# Patient Record
Sex: Female | Born: 1950
Health system: Southern US, Community
[De-identification: ages and names within clinical notes are randomized; demographics above are authoritative.]

## PROBLEM LIST (undated history)

## (undated) DIAGNOSIS — M1990 Unspecified osteoarthritis, unspecified site: Secondary | ICD-10-CM

## (undated) DIAGNOSIS — M1712 Unilateral primary osteoarthritis, left knee: Secondary | ICD-10-CM

## (undated) DIAGNOSIS — D219 Benign neoplasm of connective and other soft tissue, unspecified: Secondary | ICD-10-CM

## (undated) DIAGNOSIS — G4730 Sleep apnea, unspecified: Secondary | ICD-10-CM

## (undated) DIAGNOSIS — R87619 Unspecified abnormal cytological findings in specimens from cervix uteri: Secondary | ICD-10-CM

## (undated) DIAGNOSIS — A6009 Herpesviral infection of other urogenital tract: Secondary | ICD-10-CM

## (undated) DIAGNOSIS — R17 Unspecified jaundice: Secondary | ICD-10-CM

## (undated) DIAGNOSIS — I10 Essential (primary) hypertension: Secondary | ICD-10-CM

## (undated) DIAGNOSIS — R32 Unspecified urinary incontinence: Secondary | ICD-10-CM

## (undated) DIAGNOSIS — K5790 Diverticulosis of intestine, part unspecified, without perforation or abscess without bleeding: Secondary | ICD-10-CM

## (undated) DIAGNOSIS — Z8614 Personal history of Methicillin resistant Staphylococcus aureus infection: Secondary | ICD-10-CM

## (undated) DIAGNOSIS — M8580 Other specified disorders of bone density and structure, unspecified site: Secondary | ICD-10-CM

## (undated) DIAGNOSIS — E785 Hyperlipidemia, unspecified: Secondary | ICD-10-CM

## (undated) DIAGNOSIS — I359 Nonrheumatic aortic valve disorder, unspecified: Secondary | ICD-10-CM

## (undated) DIAGNOSIS — N816 Rectocele: Secondary | ICD-10-CM

## (undated) HISTORY — DX: Benign neoplasm of connective and other soft tissue, unspecified: D21.9

## (undated) HISTORY — DX: Hyperlipidemia, unspecified: E78.5

## (undated) HISTORY — DX: Unspecified abnormal cytological findings in specimens from cervix uteri: R87.619

## (undated) HISTORY — DX: Diverticulosis of intestine, part unspecified, without perforation or abscess without bleeding: K57.90

## (undated) HISTORY — DX: Rectocele: N81.6

## (undated) HISTORY — PX: DIAGNOSTIC LAPAROSCOPY: SUR761

## (undated) HISTORY — DX: Essential (primary) hypertension: I10

## (undated) HISTORY — PX: COLONOSCOPY: SHX5424

## (undated) HISTORY — DX: Herpesviral infection of other urogenital tract: A60.09

## (undated) HISTORY — PX: CARPAL TUNNEL RELEASE: SHX101

## (undated) HISTORY — PX: PELVIC LAPAROSCOPY: SHX162

## (undated) HISTORY — DX: Nonrheumatic aortic valve disorder, unspecified: I35.9

## (undated) HISTORY — DX: Other specified disorders of bone density and structure, unspecified site: M85.80

## (undated) HISTORY — DX: Unspecified jaundice: R17

## (undated) HISTORY — DX: Unspecified urinary incontinence: R32

---

## 1998-10-18 ENCOUNTER — Encounter: Payer: Self-pay | Admitting: Family Medicine

## 1998-10-18 ENCOUNTER — Ambulatory Visit (HOSPITAL_COMMUNITY): Admission: RE | Admit: 1998-10-18 | Discharge: 1998-10-18 | Payer: Self-pay | Admitting: Family Medicine

## 1999-10-22 HISTORY — PX: OTHER SURGICAL HISTORY: SHX169

## 1999-10-24 ENCOUNTER — Other Ambulatory Visit: Admission: RE | Admit: 1999-10-24 | Discharge: 1999-10-24 | Payer: Self-pay | Admitting: Orthopedic Surgery

## 2000-01-08 ENCOUNTER — Encounter: Payer: Self-pay | Admitting: Family Medicine

## 2000-01-08 ENCOUNTER — Ambulatory Visit (HOSPITAL_COMMUNITY): Admission: RE | Admit: 2000-01-08 | Discharge: 2000-01-08 | Payer: Self-pay | Admitting: Family Medicine

## 2001-01-09 ENCOUNTER — Encounter: Payer: Self-pay | Admitting: Family Medicine

## 2001-01-09 ENCOUNTER — Ambulatory Visit (HOSPITAL_COMMUNITY): Admission: RE | Admit: 2001-01-09 | Discharge: 2001-01-09 | Payer: Self-pay | Admitting: Family Medicine

## 2001-02-24 ENCOUNTER — Ambulatory Visit (HOSPITAL_COMMUNITY): Admission: RE | Admit: 2001-02-24 | Discharge: 2001-02-24 | Payer: Self-pay | Admitting: Gastroenterology

## 2001-03-11 ENCOUNTER — Encounter: Payer: Self-pay | Admitting: Gastroenterology

## 2001-03-11 ENCOUNTER — Ambulatory Visit (HOSPITAL_COMMUNITY): Admission: RE | Admit: 2001-03-11 | Discharge: 2001-03-11 | Payer: Self-pay | Admitting: Gastroenterology

## 2001-04-10 ENCOUNTER — Encounter: Payer: Self-pay | Admitting: Family Medicine

## 2001-04-10 ENCOUNTER — Ambulatory Visit (HOSPITAL_COMMUNITY): Admission: RE | Admit: 2001-04-10 | Discharge: 2001-04-10 | Payer: Self-pay | Admitting: Family Medicine

## 2001-06-01 ENCOUNTER — Ambulatory Visit (HOSPITAL_COMMUNITY): Admission: RE | Admit: 2001-06-01 | Discharge: 2001-06-01 | Payer: Self-pay | Admitting: Family Medicine

## 2001-06-01 ENCOUNTER — Encounter: Payer: Self-pay | Admitting: Family Medicine

## 2002-01-20 ENCOUNTER — Encounter: Payer: Self-pay | Admitting: Family Medicine

## 2002-01-20 ENCOUNTER — Ambulatory Visit (HOSPITAL_COMMUNITY): Admission: RE | Admit: 2002-01-20 | Discharge: 2002-01-20 | Payer: Self-pay | Admitting: Family Medicine

## 2003-03-16 ENCOUNTER — Encounter: Payer: Self-pay | Admitting: Family Medicine

## 2003-03-16 ENCOUNTER — Ambulatory Visit (HOSPITAL_COMMUNITY): Admission: RE | Admit: 2003-03-16 | Discharge: 2003-03-16 | Payer: Self-pay | Admitting: Family Medicine

## 2004-03-16 ENCOUNTER — Ambulatory Visit (HOSPITAL_COMMUNITY): Admission: RE | Admit: 2004-03-16 | Discharge: 2004-03-16 | Payer: Self-pay | Admitting: Family Medicine

## 2004-09-03 ENCOUNTER — Other Ambulatory Visit: Admission: RE | Admit: 2004-09-03 | Discharge: 2004-09-03 | Payer: Self-pay | Admitting: Family Medicine

## 2004-09-20 ENCOUNTER — Ambulatory Visit (HOSPITAL_COMMUNITY): Admission: RE | Admit: 2004-09-20 | Discharge: 2004-09-20 | Payer: Self-pay | Admitting: Family Medicine

## 2004-09-24 ENCOUNTER — Ambulatory Visit (HOSPITAL_COMMUNITY): Admission: RE | Admit: 2004-09-24 | Discharge: 2004-09-24 | Payer: Self-pay | Admitting: Family Medicine

## 2005-03-19 ENCOUNTER — Ambulatory Visit (HOSPITAL_COMMUNITY): Admission: RE | Admit: 2005-03-19 | Discharge: 2005-03-19 | Payer: Self-pay | Admitting: Family Medicine

## 2005-09-04 ENCOUNTER — Other Ambulatory Visit: Admission: RE | Admit: 2005-09-04 | Discharge: 2005-09-04 | Payer: Self-pay | Admitting: Family Medicine

## 2006-04-21 ENCOUNTER — Ambulatory Visit (HOSPITAL_COMMUNITY): Admission: RE | Admit: 2006-04-21 | Discharge: 2006-04-21 | Payer: Self-pay | Admitting: Family Medicine

## 2006-09-23 ENCOUNTER — Other Ambulatory Visit: Admission: RE | Admit: 2006-09-23 | Discharge: 2006-09-23 | Payer: Self-pay | Admitting: Family Medicine

## 2007-03-12 ENCOUNTER — Ambulatory Visit (HOSPITAL_COMMUNITY): Admission: RE | Admit: 2007-03-12 | Discharge: 2007-03-12 | Payer: Self-pay | Admitting: Family Medicine

## 2007-05-07 ENCOUNTER — Ambulatory Visit (HOSPITAL_COMMUNITY): Admission: RE | Admit: 2007-05-07 | Discharge: 2007-05-07 | Payer: Self-pay | Admitting: Family Medicine

## 2007-10-12 ENCOUNTER — Other Ambulatory Visit: Admission: RE | Admit: 2007-10-12 | Discharge: 2007-10-12 | Payer: Self-pay | Admitting: Family Medicine

## 2008-05-13 ENCOUNTER — Ambulatory Visit (HOSPITAL_COMMUNITY): Admission: RE | Admit: 2008-05-13 | Discharge: 2008-05-13 | Payer: Self-pay | Admitting: Family Medicine

## 2008-07-25 ENCOUNTER — Encounter: Admission: RE | Admit: 2008-07-25 | Discharge: 2008-10-19 | Payer: Self-pay | Admitting: Internal Medicine

## 2008-10-17 ENCOUNTER — Ambulatory Visit: Payer: Self-pay | Admitting: Sports Medicine

## 2008-10-17 DIAGNOSIS — M79609 Pain in unspecified limb: Secondary | ICD-10-CM | POA: Insufficient documentation

## 2008-10-17 DIAGNOSIS — M7710 Lateral epicondylitis, unspecified elbow: Secondary | ICD-10-CM | POA: Insufficient documentation

## 2008-10-18 ENCOUNTER — Encounter: Payer: Self-pay | Admitting: Sports Medicine

## 2008-10-21 HISTORY — PX: BILATERAL OOPHORECTOMY: SHX1221

## 2008-11-29 ENCOUNTER — Other Ambulatory Visit: Admission: RE | Admit: 2008-11-29 | Discharge: 2008-11-29 | Payer: Self-pay | Admitting: Family Medicine

## 2008-12-01 ENCOUNTER — Ambulatory Visit (HOSPITAL_COMMUNITY): Admission: RE | Admit: 2008-12-01 | Discharge: 2008-12-01 | Payer: Self-pay | Admitting: Family Medicine

## 2009-05-18 ENCOUNTER — Ambulatory Visit (HOSPITAL_COMMUNITY): Admission: RE | Admit: 2009-05-18 | Discharge: 2009-05-18 | Payer: Self-pay | Admitting: Family Medicine

## 2009-08-25 ENCOUNTER — Ambulatory Visit (HOSPITAL_COMMUNITY): Admission: RE | Admit: 2009-08-25 | Discharge: 2009-08-25 | Payer: Self-pay | Admitting: Family Medicine

## 2009-09-02 ENCOUNTER — Emergency Department (HOSPITAL_COMMUNITY): Admission: EM | Admit: 2009-09-02 | Discharge: 2009-09-02 | Payer: Self-pay | Admitting: Emergency Medicine

## 2009-09-08 ENCOUNTER — Ambulatory Visit (HOSPITAL_COMMUNITY): Admission: RE | Admit: 2009-09-08 | Discharge: 2009-09-08 | Payer: Self-pay | Admitting: Obstetrics and Gynecology

## 2010-05-23 ENCOUNTER — Ambulatory Visit (HOSPITAL_COMMUNITY): Admission: RE | Admit: 2010-05-23 | Discharge: 2010-05-23 | Payer: Self-pay | Admitting: Family Medicine

## 2010-11-11 ENCOUNTER — Encounter: Payer: Self-pay | Admitting: Family Medicine

## 2011-01-23 LAB — URINALYSIS, ROUTINE W REFLEX MICROSCOPIC
Bilirubin Urine: NEGATIVE
Glucose, UA: NEGATIVE mg/dL
Glucose, UA: NEGATIVE mg/dL
Hgb urine dipstick: NEGATIVE
Ketones, ur: NEGATIVE mg/dL
Nitrite: NEGATIVE
Protein, ur: NEGATIVE mg/dL
Protein, ur: NEGATIVE mg/dL
Specific Gravity, Urine: 1.025 (ref 1.005–1.030)
Urobilinogen, UA: 0.2 mg/dL (ref 0.0–1.0)
pH: 5 (ref 5.0–8.0)
pH: 6 (ref 5.0–8.0)

## 2011-01-23 LAB — CBC
HCT: 41.7 % (ref 36.0–46.0)
HCT: 40.1 % (ref 36.0–46.0)
Hemoglobin: 14.2 g/dL (ref 12.0–15.0)
Hemoglobin: 14.1 g/dL (ref 12.0–15.0)
MCHC: 33.9 g/dL (ref 30.0–36.0)
MCHC: 35.1 g/dL (ref 30.0–36.0)
MCV: 94.9 fL (ref 78.0–100.0)
MCV: 94.1 fL (ref 78.0–100.0)
Platelets: 282 10*3/uL (ref 150–400)
Platelets: 237 10*3/uL (ref 150–400)
RBC: 4.4 MIL/uL (ref 3.87–5.11)
RBC: 4.26 MIL/uL (ref 3.87–5.11)
RDW: 12.9 % (ref 11.5–15.5)
RDW: 12 % (ref 11.5–15.5)
WBC: 6 10*3/uL (ref 4.0–10.5)
WBC: 6.1 10*3/uL (ref 4.0–10.5)

## 2011-01-23 LAB — COMPREHENSIVE METABOLIC PANEL
ALT: 83 U/L — ABNORMAL HIGH (ref 0–35)
AST: 51 U/L — ABNORMAL HIGH (ref 0–37)
AST: 26 U/L (ref 0–37)
Albumin: 4.5 g/dL (ref 3.5–5.2)
Albumin: 4 g/dL (ref 3.5–5.2)
Alkaline Phosphatase: 53 U/L (ref 39–117)
Alkaline Phosphatase: 45 U/L (ref 39–117)
BUN: 7 mg/dL (ref 6–23)
CO2: 22 mEq/L (ref 19–32)
Calcium: 9.7 mg/dL (ref 8.4–10.5)
Chloride: 109 mEq/L (ref 96–112)
Chloride: 107 mEq/L (ref 96–112)
Creatinine, Ser: 0.77 mg/dL (ref 0.4–1.2)
GFR calc Af Amer: >60 mL/min (ref >60)
GFR calc Af Amer: >60 mL/min (ref >60)
GFR calc non Af Amer: >60 mL/min (ref >60)
Glucose, Bld: 106 mg/dL — ABNORMAL HIGH (ref 70–99)
Potassium: 4.1 mEq/L (ref 3.5–5.1)
Potassium: 3.8 mEq/L (ref 3.5–5.1)
Sodium: 139 mEq/L (ref 135–145)
Total Bilirubin: 1 mg/dL (ref 0.3–1.2)
Total Bilirubin: 1.2 mg/dL (ref 0.3–1.2)
Total Protein: 7.8 g/dL (ref 6.0–8.3)
Total Protein: 7.4 g/dL (ref 6.0–8.3)

## 2011-01-23 LAB — DIFFERENTIAL
Basophils Absolute: 0 10*3/uL (ref 0.0–0.1)
Basophils Absolute: 0.1 10*3/uL (ref 0.0–0.1)
Basophils Relative: 1 % (ref 0–1)
Basophils Relative: 1 % (ref 0–1)
Eosinophils Absolute: 0.2 10*3/uL (ref 0.0–0.7)
Eosinophils Absolute: 0.2 10*3/uL (ref 0.0–0.7)
Eosinophils Relative: 3 % (ref 0–5)
Lymphocytes Relative: 25 % (ref 12–46)
Lymphs Abs: 1.5 10*3/uL (ref 0.7–4.0)
Lymphs Abs: 1.5 10*3/uL (ref 0.7–4.0)
Monocytes Absolute: 0.5 10*3/uL (ref 0.1–1.0)
Monocytes Relative: 8 % (ref 3–12)
Monocytes Relative: 5 % (ref 3–12)
Neutro Abs: 3.8 10*3/uL (ref 1.7–7.7)
Neutro Abs: 4 10*3/uL (ref 1.7–7.7)
Neutrophils Relative %: 64 % (ref 43–77)

## 2011-01-23 LAB — BUN: BUN: 13 mg/dL (ref 6–23)

## 2011-01-23 LAB — URINE MICROSCOPIC-ADD ON

## 2011-01-23 LAB — CREATININE, SERUM
Creatinine, Ser: 0.72 mg/dL (ref 0.4–1.2)
GFR calc Af Amer: >60 mL/min (ref >60)
GFR calc non Af Amer: >60 mL/min (ref >60)

## 2011-02-12 ENCOUNTER — Encounter: Payer: Self-pay | Admitting: *Deleted

## 2011-03-08 NOTE — Procedures (Signed)
[facility]  Patient:    Rebecca Terry, Rebecca Terry                   MRN: [id number] Proc. Date: 02/24/01 Adm. Date:  [id number] Attending:  Louie Bun CC:         Dario Guardian, M.D.   Procedure Report  PROCEDURE:  Esophagogastroduodenoscopy with biopsy.  INDICATIONS FOR PROCEDURE:  A seven week history of dyspepsia first noticed after eating [place] food. She has had no response to proton pump inhibitors and still complains of abdominal tenderness, sensation of regurgitation and upset stomach without specific abdominal pain. Procedure is to assess for any mucosal abnormalities of the upper GI tract to account for symptoms and to assess for presence of Helicobacter.  DESCRIPTION OF PROCEDURE:  The patient was placed in the left lateral decubitus position then placed on the pulse monitor with continuous low flow oxygen delivered by nasal cannula. She was sedated with 70 mg IV Demerol and 7 mg IV Versed. The Olympus video endoscope was advanced under direct vision into the oropharynx and esophagus. The esophagus was straight and of normal caliber at the squamocolumnar line at 38 cm. There was on visible hiatal hernia, ring, stricture or other abnormality of the gastroesophageal junction. The squamocolumnar line was sharply outlined. The stomach was entered and a small amount of liquid secretions were suctioned from the fundus. Retroflexed view of the cardia was unremarkable. The fundus and body appeared normal. The antrum showed mild erythema and granularity possibly consistent with mild gastritis. A CLOtest was obtained. The duodenum was entered and both the bulb and second portion were well inspected and appeared to be within normal limits. The endoscope was then withdrawn and the patient returned to the recovery room in stable condition. She tolerated the procedure well and there were no immediate complications.  IMPRESSION:  Mild appearance of  antral gastritis otherwise normal endoscopy.  PLAN:  If CLOtest positive will treat for eradication of Helicobacter. If not will try a trial of Reglan and consider upper abdominal ultrasound. DD:  02/24/01 TD:  02/25/01 Job: [id number]

## 2011-06-17 ENCOUNTER — Other Ambulatory Visit (HOSPITAL_COMMUNITY): Payer: Self-pay | Admitting: Family Medicine

## 2011-06-17 DIAGNOSIS — Z1231 Encounter for screening mammogram for malignant neoplasm of breast: Secondary | ICD-10-CM

## 2011-06-26 ENCOUNTER — Other Ambulatory Visit: Payer: Self-pay | Admitting: Obstetrics and Gynecology

## 2011-07-01 ENCOUNTER — Ambulatory Visit (HOSPITAL_COMMUNITY)
Admission: RE | Admit: 2011-07-01 | Discharge: 2011-07-01 | Disposition: A | Discharge status: Home / Self Care | Payer: 59 | Source: Ambulatory Visit | Attending: Interventional Cardiology | Admitting: Interventional Cardiology

## 2011-07-01 DIAGNOSIS — R011 Cardiac murmur, unspecified: Secondary | ICD-10-CM | POA: Insufficient documentation

## 2011-07-01 DIAGNOSIS — I10 Essential (primary) hypertension: Secondary | ICD-10-CM | POA: Insufficient documentation

## 2011-07-01 DIAGNOSIS — I359 Nonrheumatic aortic valve disorder, unspecified: Secondary | ICD-10-CM | POA: Insufficient documentation

## 2011-07-03 ENCOUNTER — Ambulatory Visit (HOSPITAL_COMMUNITY)
Admission: RE | Admit: 2011-07-03 | Discharge: 2011-07-03 | Disposition: A | Discharge status: Home / Self Care | Payer: 59 | Source: Ambulatory Visit | Attending: Family Medicine | Admitting: Family Medicine

## 2011-07-03 DIAGNOSIS — Z1231 Encounter for screening mammogram for malignant neoplasm of breast: Secondary | ICD-10-CM | POA: Insufficient documentation

## 2011-07-22 NOTE — Patient Instructions (Addendum)
   Your procedure is scheduled NU:UVOZDGU Oct 9th  Enter through the Main Entrance of [facility] at:9:45 am Pick up the phone at the desk and dial [phone] and inform us of your arrival  Please call this number if you have any problems the morning of surgery: [phone]  Remember: Do not eat food after midnight  Do not drink clear liquids after:midnight Take these medicines the morning of surgery with a SIP OF WATER:lisinopril  Do not wear jewelry, make-up, or FINGER nail polish Do not wear lotions, powders, or perfumes.  You may wear deodorant. Do not shave 48 hours prior to surgery. Do not bring valuables to the hospital. Leave suitcase in the car. After Surgery it may be brought to your room. For patients being admitted to the hospital, checkout time is 11:00am the day of discharge.  Patients discharged on the day of surgery will not be allowed to drive home.   Name and phone number of your driver:   Remember to use your hibiclens as instructed.Please shower with 1/2 bottle the evening before your surgery and the other 1/2 bottle the morning of surgery.

## 2011-07-24 ENCOUNTER — Inpatient Hospital Stay (HOSPITAL_COMMUNITY): Admission: RE | Admit: 2011-07-24 | Discharge: 2011-07-24 | Payer: 59 | Source: Ambulatory Visit

## 2011-07-30 ENCOUNTER — Ambulatory Visit (HOSPITAL_COMMUNITY): Admission: RE | Admit: 2011-07-30 | Payer: 59 | Source: Ambulatory Visit | Admitting: Obstetrics and Gynecology

## 2011-07-30 ENCOUNTER — Encounter (HOSPITAL_COMMUNITY): Admission: RE | Payer: Self-pay | Source: Ambulatory Visit

## 2011-07-30 SURGERY — URETHROPEXY, USING TRANSVAGINAL TAPE
Anesthesia: Choice

## 2011-11-04 ENCOUNTER — Other Ambulatory Visit (HOSPITAL_COMMUNITY)
Admission: RE | Admit: 2011-11-04 | Discharge: 2011-11-04 | Disposition: A | Discharge status: Home / Self Care | Payer: 59 | Source: Ambulatory Visit | Attending: Family Medicine | Admitting: Family Medicine

## 2011-11-04 ENCOUNTER — Other Ambulatory Visit: Payer: Self-pay | Admitting: Family Medicine

## 2011-11-04 DIAGNOSIS — Z01419 Encounter for gynecological examination (general) (routine) without abnormal findings: Secondary | ICD-10-CM | POA: Insufficient documentation

## 2012-03-23 ENCOUNTER — Other Ambulatory Visit (HOSPITAL_COMMUNITY): Payer: Self-pay | Admitting: Family Medicine

## 2012-03-23 ENCOUNTER — Ambulatory Visit (HOSPITAL_COMMUNITY)
Admission: RE | Admit: 2012-03-23 | Discharge: 2012-03-23 | Disposition: A | Discharge status: Home / Self Care | Payer: 59 | Source: Ambulatory Visit | Attending: Family Medicine | Admitting: Family Medicine

## 2012-03-23 DIAGNOSIS — K5792 Diverticulitis of intestine, part unspecified, without perforation or abscess without bleeding: Secondary | ICD-10-CM

## 2012-03-23 DIAGNOSIS — R109 Unspecified abdominal pain: Secondary | ICD-10-CM | POA: Insufficient documentation

## 2012-03-23 DIAGNOSIS — R509 Fever, unspecified: Secondary | ICD-10-CM | POA: Insufficient documentation

## 2012-03-23 DIAGNOSIS — K5732 Diverticulitis of large intestine without perforation or abscess without bleeding: Secondary | ICD-10-CM | POA: Insufficient documentation

## 2012-03-23 MED ORDER — IOHEXOL 300 MG/ML  SOLN
80.0000 mL | Freq: Once | INTRAMUSCULAR | Status: AC | PRN
  Administered 2012-03-23: 80 mL via INTRAVENOUS

## 2012-07-14 ENCOUNTER — Other Ambulatory Visit: Payer: Self-pay | Admitting: Family Medicine

## 2012-07-14 DIAGNOSIS — Z0000 Encounter for general adult medical examination without abnormal findings: Secondary | ICD-10-CM

## 2012-08-06 ENCOUNTER — Ambulatory Visit (HOSPITAL_COMMUNITY)
Admission: RE | Admit: 2012-08-06 | Discharge: 2012-08-06 | Disposition: A | Discharge status: Home / Self Care | Payer: 59 | Source: Ambulatory Visit | Attending: Family Medicine | Admitting: Family Medicine

## 2012-08-06 DIAGNOSIS — Z0000 Encounter for general adult medical examination without abnormal findings: Secondary | ICD-10-CM

## 2012-08-06 DIAGNOSIS — Z1231 Encounter for screening mammogram for malignant neoplasm of breast: Secondary | ICD-10-CM | POA: Insufficient documentation

## 2013-03-17 ENCOUNTER — Ambulatory Visit (INDEPENDENT_AMBULATORY_CARE_PROVIDER_SITE_OTHER): Payer: 59 | Admitting: Obstetrics and Gynecology

## 2013-03-17 ENCOUNTER — Encounter: Payer: Self-pay | Admitting: Obstetrics and Gynecology

## 2013-03-17 VITALS — BP 130/76 | Ht 64.0 in | Wt 164.0 lb

## 2013-03-17 DIAGNOSIS — M899 Disorder of bone, unspecified: Secondary | ICD-10-CM

## 2013-03-17 DIAGNOSIS — N952 Postmenopausal atrophic vaginitis: Secondary | ICD-10-CM

## 2013-03-17 DIAGNOSIS — Z0000 Encounter for general adult medical examination without abnormal findings: Secondary | ICD-10-CM

## 2013-03-17 DIAGNOSIS — M8580 Other specified disorders of bone density and structure, unspecified site: Secondary | ICD-10-CM

## 2013-03-17 DIAGNOSIS — M949 Disorder of cartilage, unspecified: Secondary | ICD-10-CM

## 2013-03-17 DIAGNOSIS — T148 Other injury of unspecified body region: Secondary | ICD-10-CM

## 2013-03-17 DIAGNOSIS — W57XXXA Bitten or stung by nonvenomous insect and other nonvenomous arthropods, initial encounter: Secondary | ICD-10-CM

## 2013-03-17 DIAGNOSIS — Z01419 Encounter for gynecological examination (general) (routine) without abnormal findings: Secondary | ICD-10-CM

## 2013-03-17 LAB — COMPREHENSIVE METABOLIC PANEL
AST: 14 U/L (ref 0–37)
Albumin: 4.1 g/dL (ref 3.5–5.2)
BUN: 22 mg/dL (ref 6–23)
CO2: 24 mEq/L (ref 19–32)
Calcium: 9.3 mg/dL (ref 8.4–10.5)
Chloride: 107 mEq/L (ref 96–112)
Creat: 0.81 mg/dL (ref 0.50–1.10)
Glucose, Bld: 89 mg/dL (ref 70–99)
Potassium: 4.6 mEq/L (ref 3.5–5.3)

## 2013-03-17 LAB — POCT URINALYSIS DIPSTICK
Bilirubin, UA: NEGATIVE
Glucose, UA: NEGATIVE
Ketones, UA: NEGATIVE
Leukocytes, UA: NEGATIVE
Nitrite, UA: NEGATIVE
pH, UA: 5

## 2013-03-17 LAB — TSH: TSH: 2.046 u[IU]/mL (ref 0.350–4.500)

## 2013-03-17 LAB — HEMOGLOBIN, FINGERSTICK: Hemoglobin, fingerstick: 13.4 g/dL (ref 12.0–16.0)

## 2013-03-17 LAB — LIPID PANEL
Cholesterol: 155 mg/dL (ref 0–200)
Triglycerides: 92 mg/dL (ref <150)

## 2013-03-17 MED ORDER — ESTROGENS, CONJUGATED 0.625 MG/GM VA CREA: TOPICAL_CREAM | Freq: Every day | VAGINAL | Status: DC

## 2013-03-17 MED ORDER — CONJ ESTROG-MEDROXYPROGEST ACE 0.3-1.5 MG PO TABS: 1.0000 | ORAL_TABLET | Freq: Every day | ORAL | Status: DC

## 2013-03-17 NOTE — Progress Notes (Signed)
Patient ID: Rebecca Terry, female   DOB: November 01, 1950, 62 y.o.   MRN: [id number] 62 y.o.   Single    Caucasian   female   [id number]   here for annual exam.   On PremPro.  Cognition is better on HRT. Concerned about vaginal atrophy. In new relationship and sexually active. Doing OK with incontinence (stress) and rectocele.  Wants to continue with observation.  Wants Vitamin D check.  Pulled a tick off this am.  Spends a lot of time outside gardening.   Patient's last menstrual period was 10/21/1992.          Sexually active: yes  The current method of family planning is post menopausal status.    Exercising: No Last mammogram:  October 2013 wnl: [facility]. Last pap smear: 02/2012 wnl.  No HPV testing.   History of abnormal pap: no Smoking: no Alcohol: 5 glasses of wine/beer per week Last colonoscopy: 2012-diverticulosis:  [place]:Dr. Dorena Cookey.  Next colonoscopy due 2022 Last Bone Density:  Years ago: osteopenia Last tetanus shot: 7 years ago Last cholesterol check: 2012 within normal limits  Hgb:  13.4            Urine: Neg   Family History  Problem Relation Age of Onset  . Asthma Mother   . Congestive Heart Failure Father   . Multiple sclerosis Father     Patient Active Problem List   Diagnosis Date Noted  . LATERAL EPICONDYLITIS, RIGHT 10/17/2008  . ARM PAIN, RIGHT 10/17/2008    Past Medical History  Diagnosis Date  . Fibroid     posterior  . Hypertension   . Urinary incontinence     Past Surgical History  Procedure Laterality Date  . Bilateral oophorectomy  2010    Dr. Edward Jolly  . Pelvic laparoscopy      x2--ectopics  . Carpal tunnel release Bilateral   . Arthroscopic knee Left     Allergies: Adhesive; Latex; and Penicillins  Current Outpatient Prescriptions  Medication Sig Dispense Refill  . calcium carbonate (OS-CAL) 600 MG TABS Take 600 mg by mouth 2 (two) times daily with a meal.      . cholecalciferol (VITAMIN D) 1000 UNITS tablet Take  1,000 Units by mouth daily.      Marland Kitchen estrogen, conjugated,-medroxyprogesterone (PREMPRO) 0.3-1.5 MG per tablet Take 1 tablet by mouth daily.        Marland Kitchen lisinopril (PRINIVIL,ZESTRIL) 5 MG tablet Take 5 mg by mouth daily. Takes 1/2 tablet daily      . aspirin EC 81 MG tablet Take 81 mg by mouth daily.        . diclofenac sodium (VOLTAREN) 1 % GEL Use qid       No current facility-administered medications for this visit.    ROS: Pertinent items are noted in HPI.  Social Hx:  Divorced.  In new relationship.  Works as OR Best boy at [employer].  One daughter.  Exam:    BP 130/76  Ht 5\' 4"  (1.626 m)  Wt 164 lb (74.39 kg)  BMI 28.14 kg/m2  LMP 10/21/1992   Wt Readings from Last 3 Encounters:  03/17/13 164 lb (74.39 kg)  10/17/08 159 lb (72.122 kg)     Ht Readings from Last 3 Encounters:  03/17/13 5\' 4"  (1.626 m)    General appearance: alert, cooperative and appears stated age Head: Normocephalic, without obvious abnormality, atraumatic Neck: no adenopathy, supple, symmetrical, trachea midline and thyroid not enlarged, symmetric, no tenderness/mass/nodules Lungs: clear  to auscultation bilaterally Breasts: Inspection negative, No nipple retraction or dimpling, No nipple discharge or bleeding, No axillary or supraclavicular adenopathy, Normal to palpation without dominant masses Heart: regular rate and rhythm Abdomen: soft, non-tender; no masses,  no organomegaly Extremities: extremities normal, atraumatic, no cyanosis or edema Skin: Skin color, texture, turgor normal. No rashes or lesions Lymph nodes: Cervical, supraclavicular, and axillary nodes normal. No abnormal inguinal nodes palpated Neurologic: Grossly normal   Pelvic: External genitalia:  Small tick removed from right suprapubic region using forceps/tweezer.  Left hip area with small red 4 mm area from where other tick was located and previously removed.              Urethra:  normal appearing urethra with no masses,  tenderness or lesions              Bartholins and Skenes: normal                 Vagina: normal appearing vagina with normal color and discharge, no lesions.  First degree rectocele.              Cervix: normal appearance              Pap taken: yes and high risk HPV requested.        Bimanual Exam:  Uterus:  uterus is normal size, shape, consistency and nontender                                      Adnexa: normal adnexa in size, nontender and no masses                                      Rectovaginal: Confirms                                      Anus:  normal sphincter tone, no lesions  Assessment Rectocele Genuine stress incontinence HRT patient Osteopenia History of vitamin D deficiency. Tick bites Atrophic vaginal changes.     P: mammogram in October pap smear and high risk HPV Observation of prolapse and incontinence Continue with PremPro.  No contraindications.  See EPIC orders. Premarin vaginal cream.  See EPIC orders. Lipid profile, CMP, TSH, Vitamin D Bone density at [facility]. Signs and symptoms of complications of tick bites reviewed. return annually or prn     An After Visit Summary was printed and given to the patient.

## 2013-03-17 NOTE — Patient Instructions (Addendum)

## 2013-03-18 ENCOUNTER — Telehealth: Payer: Self-pay

## 2013-03-18 LAB — VITAMIN D 25 HYDROXY (VIT D DEFICIENCY, FRACTURES): Vit D, 25-Hydroxy: 43 ng/mL (ref 30–89)

## 2013-03-18 NOTE — Telephone Encounter (Signed)
Patient notified labs normal.  Pap and bone density pending.

## 2013-03-18 NOTE — Telephone Encounter (Signed)
LMOVM  To call for test results. 

## 2013-03-18 NOTE — Telephone Encounter (Signed)
Message copied by Alphonsa Overall on Thu Mar 18, 2013 12:56 PM ------      Message from: Conley Simmonds      Created: Thu Mar 18, 2013 12:33 PM       Please inform patient of normal results.            Pap and bone density are pending. ------

## 2013-03-18 NOTE — Telephone Encounter (Signed)
Patient returning Amanda's call.

## 2013-03-24 ENCOUNTER — Ambulatory Visit (HOSPITAL_COMMUNITY)
Admission: RE | Admit: 2013-03-24 | Discharge: 2013-03-24 | Disposition: A | Discharge status: Home / Self Care | Payer: 59 | Source: Ambulatory Visit | Attending: Obstetrics and Gynecology | Admitting: Obstetrics and Gynecology

## 2013-03-24 DIAGNOSIS — Z780 Asymptomatic menopausal state: Secondary | ICD-10-CM | POA: Insufficient documentation

## 2013-03-24 DIAGNOSIS — Z13820 Encounter for screening for osteoporosis: Secondary | ICD-10-CM | POA: Insufficient documentation

## 2013-03-24 DIAGNOSIS — M8580 Other specified disorders of bone density and structure, unspecified site: Secondary | ICD-10-CM

## 2013-03-26 ENCOUNTER — Telehealth: Payer: Self-pay | Admitting: *Deleted

## 2013-03-26 ENCOUNTER — Telehealth: Payer: Self-pay | Admitting: Obstetrics and Gynecology

## 2013-03-26 DIAGNOSIS — B373 Candidiasis of vulva and vagina: Secondary | ICD-10-CM

## 2013-03-26 MED ORDER — FLUCONAZOLE 150 MG PO TABS: 150.0000 mg | ORAL_TABLET | Freq: Once | ORAL | Status: DC

## 2013-03-26 NOTE — Telephone Encounter (Signed)
Rebecca Terry, Rebecca Terry out patient. Patient says she was just talking to [place]. She will take the Diflucan

## 2013-03-26 NOTE — Telephone Encounter (Signed)
Message copied by Alisa Graff on Fri Mar 26, 2013  2:35 PM ------      Message from: Douglass Rivers      Created: Fri Mar 19, 2013  9:26 PM       Pt will need colpo, LGSIL with +HRHPV, did not release to mychart ------

## 2013-03-26 NOTE — Telephone Encounter (Signed)
Per Dr Tresa Res verbal order, Diflucan sent to [facility].LM on VM ( VM states "this is Jae Dire") that rx was called as previously discussed.

## 2013-03-26 NOTE — Telephone Encounter (Signed)
Call to patient to notify of Pap results. LMTCB.

## 2013-03-26 NOTE — Telephone Encounter (Signed)
Patient returned call.  Discussed abnormal pap result and recommendation for colpo for further evaluation.  Patient asking about repeating pap. Advised that recommendation for her age would be colpo to further evaluate and rule out anything more progressive since pap is only a screening. 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  Briefly reviewed procedure, instructed to take Mortin 800mg  1 hr prior with food.  Colpo sched for 04-07-13 with Dr Edward Jolly.  Patietn saw on MyCahrt where she has positive yeast also on Pap and she has external irritation so she has started Monistat.  Offered Diflucan but patient has never taken before.  She will continue with Monistat and hydrocortisone ointment.  If not resolved, she will call me back and will check with MD for Diflucan.  Patient agreeable.

## 2013-03-31 ENCOUNTER — Telehealth: Payer: Self-pay | Admitting: *Deleted

## 2013-03-31 NOTE — Telephone Encounter (Signed)
Colpo is scheduled with Dr Edward Jolly on 04-07-13.

## 2013-03-31 NOTE — Telephone Encounter (Signed)
Message copied by Alisa Graff on Wed Mar 31, 2013  9:18 AM ------      Message from: Douglass Rivers      Created: Tue Mar 30, 2013  8:01 AM       Is this colpo in the works? ------

## 2013-04-07 ENCOUNTER — Encounter: Payer: Self-pay | Admitting: Obstetrics and Gynecology

## 2013-04-07 ENCOUNTER — Ambulatory Visit (INDEPENDENT_AMBULATORY_CARE_PROVIDER_SITE_OTHER): Payer: 59 | Admitting: Obstetrics and Gynecology

## 2013-04-07 VITALS — BP 136/82 | HR 64 | Temp 98.5°F | Ht 64.0 in | Wt 162.0 lb

## 2013-04-07 DIAGNOSIS — R87612 Low grade squamous intraepithelial lesion on cytologic smear of cervix (LGSIL): Secondary | ICD-10-CM

## 2013-04-07 DIAGNOSIS — M8580 Other specified disorders of bone density and structure, unspecified site: Secondary | ICD-10-CM

## 2013-04-07 DIAGNOSIS — M899 Disorder of bone, unspecified: Secondary | ICD-10-CM

## 2013-04-08 ENCOUNTER — Encounter: Payer: Self-pay | Admitting: Obstetrics and Gynecology

## 2013-04-08 NOTE — Patient Instructions (Signed)
Colposcopy Colposcopy is a procedure that uses a special lighted microscope (colposcope). It examines your cervix and vagina, or the area around the outside of the vagina, for signs of disease or abnormalities in the cells. You may be sent to a specialist (gynecologist) to do the colposcopy. A biopsy (tissue sample) may be collected during a colposcopy, if the caregiver finds any unusual cells. The biopsy is sent to the lab for further testing, and the results are reported back to your caregiver. A WOMAN MAY NEED THIS PROCEDURE IF:  She has had an abnormal pap smear (taking cells from the cervix for testing).  She has a sore on her cervix, and a Pap test was normal.  The Pap test suggests human papilloma virus (HPV). This virus can cause genital warts and is linked to the development of cervical cancer.  She has genital warts on the cervix, or in or around the outside of the vagina.  Her mother took the drug DES while pregnant.  She has painful intercourse.  She has vaginal bleeding, especially after sexual intercourse.  There is a need to evaluate the results of previous treatment. BEFORE THE PROCEDURE   Colposcopy is done when you are not having a menstrual period.  For 24 hours before the colposcopy, do not:  Douche.  Use tampons.  Use medicines, creams, or suppositories in the vagina.  Have sexual intercourse. PROCEDURE   A colposcopy is done while a woman is lying on her back with her feet in foot rests (stirrups).  A speculum is placed inside the vagina to keep it open and to allow the caregiver to see the cervix. This is the same instrument used to do a pap smear.  The colposcope is placed outside the vagina. It is used to magnify and examine the cervix, vagina, and the area around the outside of the vagina.  A small amount of liquid solution is placed on the area that is to be viewed. This solution is placed on with a cotton applicator. This solution makes it easier to  see the abnormal cells.  Your caregiver will suck out mucus and cells from the canal of the cervix.  Small pieces of tissue for biopsy may be taken at the same time. You may feel mild pain or discomfort when this is done.  Your caregiver will record the location of the abnormal areas and send the tissue samples to a lab for analysis.  If your caregiver biopsies the vagina or outside of the vagina, a local anesthetic (novocaine) is usually given. AFTER THE PROCEDURE   You may have some cramping that often goes away in a few minutes. You may have some soreness for a couple of days.  You may take over-the-counter pain medicine as advised by your caregiver. Do not take aspirin because it can cause bleeding.  Lie down for a few minutes if you feel lightheaded.  You may have some bleeding or dark discharge that should stop in a few days.  You may need to wear a sanitary pad for a few days. HOME CARE INSTRUCTIONS   Avoid sex, douching, and using tampons for a week or as directed.  Only take medicine as directed by your caregiver.  Continue to take birth control pills, if you are on them.  Not all test results are available during your visit. If your test results are not back during the visit, make an appointment with your caregiver to find out the results. Do not assume everything is   normal if you have not heard from your caregiver or the medical facility. It is important for you to follow up on all of your test results.  Follow your caregiver's advice regarding medicines, activity, follow-up visits, and follow-up Pap tests. SEEK MEDICAL CARE IF:   You develop a rash.  You have problems with your medicine. SEEK IMMEDIATE MEDICAL CARE IF:  You are bleeding heavily or are passing blood clots.  You develop a fever over 102 F (38.9 C), with or without chills.  You have abnormal vaginal discharge.  You are having cramps that do not go away after taking your pain medicine.  You  feel lightheaded, dizzy, or faint.  You develop stomach pain. Document Released: 12/28/2002 Document Revised: 12/30/2011 Document Reviewed: 08/10/2009 ExitCare Patient Information 2014 [employer].  

## 2013-04-08 NOTE — Progress Notes (Signed)
Subjective:     Patient ID: Rebecca Terry, female   DOB: 01-22-1951, 62 y.o.   MRN: [id number]  HPI Patient here for colposcopy procedure for pap with LGSIL.    Also had recent bone density performed.  Review of Systems     Objective:   Physical Exam  Genitourinary:         Colposcopy  Consent for procedure.  Acetic acid placed in vagina.  Colposcopy satisfactory.  Small are of acetowhite change at 10 o'clock noted and biopsied.  ECC performed. Colposcopy of the vulva also performed.  General acetowhite change noted of the medial labia bilaterally and on the perineum, the latter of which appears to have obstetric scar noted.     Bone Density showing borderline osteopenia of the hips and spine.  See full report Assessment:     Abnormal pap.  Colposcopy consistent with HPV.  Patient is also menopausal. Osteopenia    Plan:     Follow up biopsy results. Next bone density in 2 years.  No indication for treatment outside of calcium/Vitamin D/weight bearing exercise.

## 2013-04-09 LAB — IPS CERVICAL/ECC/EMB/VULVAR/VAGINAL BIOPSY

## 2013-04-16 ENCOUNTER — Encounter: Payer: Self-pay | Admitting: Obstetrics and Gynecology

## 2013-05-10 ENCOUNTER — Other Ambulatory Visit: Payer: Self-pay | Admitting: Obstetrics and Gynecology

## 2013-05-10 ENCOUNTER — Telehealth: Payer: Self-pay | Admitting: Obstetrics and Gynecology

## 2013-05-10 MED ORDER — FLUCONAZOLE 150 MG PO TABS: 150.0000 mg | ORAL_TABLET | Freq: Once | ORAL | Status: DC

## 2013-05-10 NOTE — Telephone Encounter (Signed)
Patient would like a refill of diflucan. Patient is currently experiencing redness and itching and would like to have the prescription on hand to take if the symptoms do not go away within a couple days. Pt was offered Diflucan Rx on 03/26/2013 and pt declined, per encounter note. Should pt have an office visit? Please advise.

## 2013-05-10 NOTE — Telephone Encounter (Signed)
Patient was talking with Shanda Bumps about a billing question and was forwarded to me to help put in a request for a prescription refill. Patient would like a refill of diflucan. Patient is currently experiencing redness and itching and would like to have the prescription on hand to take if the symptoms do not go away within a couple days. She uses [facility].

## 2013-05-10 NOTE — Telephone Encounter (Signed)
Patient notified that Diflucan refill approved by dr Edward Jolly and need for OV if symptoms get worse or do resolve. Patient agreeable.

## 2013-05-10 NOTE — Telephone Encounter (Signed)
Please inform the patient I will refill the Diflucan.  If symptoms persist, office visit.

## 2013-05-10 NOTE — Telephone Encounter (Signed)
Refill of Diflucan sent.

## 2013-07-05 ENCOUNTER — Other Ambulatory Visit (HOSPITAL_COMMUNITY): Payer: Self-pay | Admitting: Family Medicine

## 2013-07-05 DIAGNOSIS — Z1231 Encounter for screening mammogram for malignant neoplasm of breast: Secondary | ICD-10-CM

## 2013-08-11 ENCOUNTER — Ambulatory Visit (HOSPITAL_COMMUNITY)
Admission: RE | Admit: 2013-08-11 | Discharge: 2013-08-11 | Disposition: A | Discharge status: Home / Self Care | Payer: 59 | Source: Ambulatory Visit | Attending: Family Medicine | Admitting: Family Medicine

## 2013-08-11 DIAGNOSIS — Z1231 Encounter for screening mammogram for malignant neoplasm of breast: Secondary | ICD-10-CM | POA: Insufficient documentation

## 2013-08-26 ENCOUNTER — Other Ambulatory Visit: Payer: Self-pay

## 2013-09-22 ENCOUNTER — Ambulatory Visit: Payer: 59 | Admitting: Obstetrics and Gynecology

## 2013-09-22 ENCOUNTER — Encounter: Payer: Self-pay | Admitting: Obstetrics and Gynecology

## 2013-09-22 ENCOUNTER — Ambulatory Visit (INDEPENDENT_AMBULATORY_CARE_PROVIDER_SITE_OTHER): Payer: 59 | Admitting: Obstetrics and Gynecology

## 2013-09-22 VITALS — BP 150/72 | HR 68 | Resp 16 | Ht 63.5 in | Wt 170.2 lb

## 2013-09-22 DIAGNOSIS — R6889 Other general symptoms and signs: Secondary | ICD-10-CM

## 2013-09-22 DIAGNOSIS — IMO0002 Reserved for concepts with insufficient information to code with codable children: Secondary | ICD-10-CM

## 2013-09-22 NOTE — Progress Notes (Signed)
Patient ID: Rebecca Terry, female   DOB: 1951-06-18, 62 y.o.   MRN: [id number]  Subjective  Patient is here for a 6 month follow up pap. Pap LGSIL. Colposcopic biopsy LGSIL. Dryness and discomfort on the vulva.  Using premarin cream 1/2 gram twice a week. Sexually active with relatively new partner.  Objective  Pelvic - vulva without lesions.  Vagina and cervix no lesions.  Uterus small and nontender.  No adnexal masses or tenderness.  Assessment  LGSIL Atrophic vulvar symptoms.  Plan  Follow up on pap.   Discussion regarding abnormal paps and HPV. Increase premarin cream to 1/2 gram to vulva three times a week.  Has Rx.  Next pap anticipated at annual exam in 6 months.   15 minutes face to face time of which over 50% was spent in counseling.

## 2013-09-22 NOTE — Patient Instructions (Signed)
Keep appointment for annual examination.  This will be hopefully the next time you will need a pap smear.  We will contact you with pap results.

## 2013-09-27 LAB — IPS PAP TEST WITH HPV

## 2013-10-13 ENCOUNTER — Encounter: Payer: Self-pay | Admitting: Sports Medicine

## 2013-10-13 ENCOUNTER — Ambulatory Visit
Admission: RE | Admit: 2013-10-13 | Discharge: 2013-10-13 | Disposition: A | Discharge status: Home / Self Care | Payer: 59 | Source: Ambulatory Visit | Attending: Sports Medicine | Admitting: Sports Medicine

## 2013-10-13 ENCOUNTER — Ambulatory Visit (INDEPENDENT_AMBULATORY_CARE_PROVIDER_SITE_OTHER): Payer: 59 | Admitting: Sports Medicine

## 2013-10-13 VITALS — BP 194/87 | Ht 64.0 in | Wt 160.0 lb

## 2013-10-13 DIAGNOSIS — M542 Cervicalgia: Secondary | ICD-10-CM

## 2013-10-13 DIAGNOSIS — M2040 Other hammer toe(s) (acquired), unspecified foot: Secondary | ICD-10-CM

## 2013-10-13 DIAGNOSIS — M2021 Hallux rigidus, right foot: Secondary | ICD-10-CM | POA: Insufficient documentation

## 2013-10-13 DIAGNOSIS — M25511 Pain in right shoulder: Secondary | ICD-10-CM

## 2013-10-13 DIAGNOSIS — M25519 Pain in unspecified shoulder: Secondary | ICD-10-CM

## 2013-10-13 DIAGNOSIS — M2020 Hallux rigidus, unspecified foot: Secondary | ICD-10-CM

## 2013-10-13 NOTE — Assessment & Plan Note (Signed)
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

## 2013-10-13 NOTE — Assessment & Plan Note (Signed)
Ultimately she needs orthotics and support to unload her medial side

## 2013-10-13 NOTE — Assessment & Plan Note (Signed)
I gave her some basic scapular stabilization exercises  I suspect most of her symptoms comes from the lifting motion but because her symptoms are bilateral we need to check her neck  Rotator cuff is strong and no sign of tear so we will see if scapular stabilization helps  Continue Aleve as needed

## 2013-10-13 NOTE — Progress Notes (Signed)
Patient ID: Rebecca Terry, female   DOB: [birth year], 62 y.o.   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 Palpation is normal with no tenderness over AC joint or bicipital groove. ROM is full in all planes. Rotator cuff strength normal throughout. No signs of impingement with negative Neer and Hawkin's tests, empty can. Speeds mildly + only on RT  and Yergason's tests normal. No labral pathology noted with negative Obrien's, negative clunk and good stability. Normal scapular function observed on LT but scapular clicking on RT and slight protraction No painful arc and no drop arm sign. No apprehension sign  Neck motion is slightly tight Bilat trapezisus spasm No neuro findings

## 2013-10-28 ENCOUNTER — Ambulatory Visit (INDEPENDENT_AMBULATORY_CARE_PROVIDER_SITE_OTHER): Payer: 59 | Admitting: Sports Medicine

## 2013-10-28 ENCOUNTER — Encounter: Payer: Self-pay | Admitting: Sports Medicine

## 2013-10-28 VITALS — BP 153/88 | HR 73 | Ht 63.5 in | Wt 165.0 lb

## 2013-10-28 DIAGNOSIS — M25519 Pain in unspecified shoulder: Secondary | ICD-10-CM

## 2013-10-28 DIAGNOSIS — M47812 Spondylosis without myelopathy or radiculopathy, cervical region: Secondary | ICD-10-CM

## 2013-10-28 DIAGNOSIS — M2040 Other hammer toe(s) (acquired), unspecified foot: Secondary | ICD-10-CM

## 2013-10-28 MED ORDER — AMITRIPTYLINE HCL 25 MG PO TABS: 25.0000 mg | ORAL_TABLET | Freq: Every day | ORAL | Status: DC

## 2013-10-28 NOTE — Assessment & Plan Note (Signed)
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

## 2013-10-28 NOTE — Assessment & Plan Note (Signed)
Hammertoe pad
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
## 2013-10-28 NOTE — Assessment & Plan Note (Signed)
Primarily referred pain  Keep up mild exercises

## 2013-12-28 ENCOUNTER — Ambulatory Visit: Payer: 59 | Admitting: Sports Medicine

## 2014-01-25 ENCOUNTER — Other Ambulatory Visit: Payer: Self-pay | Admitting: Obstetrics and Gynecology

## 2014-01-25 MED ORDER — FLUCONAZOLE 150 MG PO TABS: 150.0000 mg | ORAL_TABLET | Freq: Once | ORAL | Status: DC

## 2014-01-25 NOTE — Progress Notes (Signed)
Patient request for Diflucan.  Has just taken Keflex and now has vaginal symptoms of yeast infection. Unable to come in for office visit due to work schedule.  Will OK Diflucan.  See Epic orders.

## 2014-03-08 ENCOUNTER — Encounter: Payer: Self-pay | Admitting: Obstetrics and Gynecology

## 2014-03-08 ENCOUNTER — Telehealth: Payer: Self-pay | Admitting: Obstetrics and Gynecology

## 2014-03-08 NOTE — Telephone Encounter (Signed)
Calling patient to inform her that her appointment had been moved from 930 to 1100. Sent letter to patient

## 2014-03-21 ENCOUNTER — Ambulatory Visit: Payer: 59 | Admitting: Obstetrics and Gynecology

## 2014-04-06 ENCOUNTER — Encounter: Payer: Self-pay | Admitting: Obstetrics and Gynecology

## 2014-04-06 ENCOUNTER — Ambulatory Visit (INDEPENDENT_AMBULATORY_CARE_PROVIDER_SITE_OTHER): Payer: 59 | Admitting: Obstetrics and Gynecology

## 2014-04-06 VITALS — BP 120/70 | HR 80 | Ht 63.5 in | Wt 164.8 lb

## 2014-04-06 DIAGNOSIS — Z01419 Encounter for gynecological examination (general) (routine) without abnormal findings: Secondary | ICD-10-CM

## 2014-04-06 DIAGNOSIS — Z0000 Encounter for general adult medical examination without abnormal findings: Secondary | ICD-10-CM

## 2014-04-06 LAB — CBC
HCT: 38.9 % (ref 36.0–46.0)
Hemoglobin: 13.4 g/dL (ref 12.0–15.0)
MCH: 32 pg (ref 26.0–34.0)
MCHC: 34.4 g/dL (ref 30.0–36.0)
MCV: 92.8 fL (ref 78.0–100.0)
PLATELETS: 266 10*3/uL (ref 150–400)
RBC: 4.19 MIL/uL (ref 3.87–5.11)
RDW: 13.8 % (ref 11.5–15.5)
WBC: 7.5 10*3/uL (ref 4.0–10.5)

## 2014-04-06 LAB — POCT URINALYSIS DIPSTICK
BILIRUBIN UA: NEGATIVE
GLUCOSE UA: NEGATIVE
LEUKOCYTES UA: NEGATIVE
NITRITE UA: NEGATIVE
PH UA: 5
Protein, UA: NEGATIVE
UROBILINOGEN UA: NEGATIVE

## 2014-04-06 LAB — HEMOGLOBIN, FINGERSTICK: Hemoglobin, fingerstick: 13.8 g/dL (ref 12.0–16.0)

## 2014-04-06 MED ORDER — CONJ ESTROG-MEDROXYPROGEST ACE 0.3-1.5 MG PO TABS: 1.0000 | ORAL_TABLET | Freq: Every day | ORAL | Status: DC

## 2014-04-06 NOTE — Patient Instructions (Signed)

## 2014-04-06 NOTE — Progress Notes (Signed)
Patient ID: Rebecca Terry, female   DOB: 1951-03-12, 63 y.o.   MRN: [id number] GYNECOLOGY VISIT  PCP:  Carol Ada, MD  Referring provider:   HPI: 63 y.o.   Single  Caucasian  female   [phone] with Patient's last menstrual period was 10/21/1992.   here for   AEX.  Lost 9 pounds.  Will start deep water aerobics.   Declines surgery for stress incontinence at this time.   Status post laparoscopic BSO in 2010.   Patient is on PremPro and Premarin cream.   Hgb:   13.8 Urine:  Trace RBC's, Mod. Ketones - asymptomatic.  Patient is a low carb diet.    GYNECOLOGIC HISTORY: Patient's last menstrual period was 10/21/1992. Sexually active:  no Partner preference: female Contraception: postmenopausal   Menopausal hormone therapy: PremPro, Premarin cream DES exposure:   no Sexually transmitted diseases:  HPV  GYN procedures and prior surgeries:   Last mammogram: 08-11-13 wnl:[facility]                Last pap and high risk HPV testing: 09-22-13  Ascus:Pos. HR HPV History of abnormal pap smear:  09-22-13 Ascus:Pos. HR HPV, colpo 04/03/14 - LGSIL, pap 09/22/13 - ASCUS and positive HR HPV.   OB History   Grav Para Term Preterm Abortions TAB SAB Ect Mult Living   4 1 1  2 1  1  1        LIFESTYLE: Exercise: walking and swimming              Tobacco: no Alcohol:    1-2 drinks per week Drug use:  no  OTHER HEALTH MAINTENANCE: Tetanus/TDap:   03/2013 Gardisil:              n/a Influenza:            07/2013 Zostavax:            2013  Bone density:      03-24-13 Osteopenia:[facility] Colonoscopy:     2012 diverticulosis otherwise normal with Dr. Teena Irani.  Next colonoscopy due 2022.  Cholesterol check:  2014 wnl.  Family History  Problem Relation Age of Onset  . Asthma Mother   . Congestive Heart Failure Father   . Multiple sclerosis Father     Patient Active Problem List   Diagnosis Date Noted  . Osteoarthritis of neck 10/28/2013  . Pain in joint,  shoulder region 10/13/2013  . Hallux rigidus of right foot 10/13/2013  . Hammertoe 10/13/2013  . LATERAL EPICONDYLITIS, RIGHT 10/17/2008  . ARM PAIN, RIGHT 10/17/2008   Past Medical History  Diagnosis Date  . Fibroid     posterior  . Hypertension   . Urinary incontinence     Past Surgical History  Procedure Laterality Date  . Bilateral oophorectomy  2010    Dr. Quincy Simmonds  . Pelvic laparoscopy      x2--ectopics  . Carpal tunnel release Bilateral   . Arthroscopic knee Left     ALLERGIES: Adhesive; Latex; and Penicillins  Current Outpatient Prescriptions  Medication Sig Dispense Refill  . amitriptyline (ELAVIL) 25 MG tablet Take 1 tablet (25 mg total) by mouth at bedtime.  30 tablet  1  . conjugated estrogens (PREMARIN) vaginal cream Place vaginally daily. Use 1/2 g vaginally every night at bed time for the first 2 weeks, then use 1/2 g vaginally two times per week as needed to maintain symptom relief.  60 g  3  . estrogen, conjugated,-medroxyprogesterone (  PREMPRO) 0.3-1.5 MG per tablet Take 1 tablet by mouth daily.  90 tablet  3  . fluconazole (DIFLUCAN) 150 MG tablet Take 1 tablet (150 mg total) by mouth once. Take one tablet.  Repeat in 48 hours if symptoms are not completely resolved.  2 tablet  0  . lisinopril (PRINIVIL,ZESTRIL) 5 MG tablet Take 2.5 mg by mouth daily.       . Multiple Vitamins-Minerals (MULTIVITAMIN PO) Take by mouth daily.      . naproxen sodium (ANAPROX) 220 MG tablet Take 220 mg by mouth 2 (two) times daily with a meal.       No current facility-administered medications for this visit.     ROS:  Pertinent items are noted in HPI.  SOCIAL HISTORY:  Divorced.  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[employer]:    BP 120/70  Pulse 80  Ht 5' 3.5" (1.613 m)  Wt 164 lb 12.8 oz (74.753 kg)  BMI 28.73 kg/m2  LMP 10/21/1992   Wt Readings from Last 3 Encounters:  04/06/14 164 lb 12.8 oz (74.753 kg)  10/28/13 165 lb (74.844 kg)  10/13/13 160 lb (72.576  kg)     Ht Readings from Last 3 Encounters:  04/06/14 5' 3.5" (1.613 m)  10/28/13 5' 3.5" (1.613 m)  10/13/13 5\' 4"  (1.626 m)    General appearance: alert, cooperative and appears stated age Head: Normocephalic, without obvious abnormality, atraumatic Neck: no adenopathy, supple, symmetrical, trachea midline and thyroid not enlarged, symmetric, no tenderness/mass/nodules Lungs: clear to auscultation bilaterally Breasts: Inspection negative, No nipple retraction or dimpling, No nipple discharge or bleeding, No axillary or supraclavicular adenopathy, Normal to palpation without dominant masses Heart: regular rate and rhythm Abdomen: soft, non-tender; no masses,  no organomegaly Extremities: extremities normal, atraumatic, no cyanosis or edema Skin: Skin color, texture, turgor normal. No rashes or lesions Lymph nodes: Cervical, supraclavicular, and axillary nodes normal. No abnormal inguinal nodes palpated Neurologic: Grossly normal  Pelvic: External genitalia:  no lesions              Urethra:  normal appearing urethra with no masses, tenderness or lesions              Bartholins and Skenes: normal                 Vagina: normal appearing vagina with normal color and discharge, no lesions              Cervix: normal appearance              Pap and high risk HPV testing done: yes.            Bimanual Exam:  Uterus:  uterus is normal size, shape, consistency and nontender.  Strong Kegel.                                       Adnexa: normal adnexa in size, nontender and no masses                                      Rectovaginal: Confirms                                      Anus:  normal sphincter tone, no lesions  ASSESSMENT  Normal gynecologic exam. 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  Genuine stress incontinence.  HRT patient.  Trace microscopic hematuria.  Asymptomatic.  Ketones in urine.  On a new diet.   PLAN  Mammogram recommended yearly.  Pap smear and high risk  HPV testing performed.  Counseled on self breast exam.  Lipid profile, CMP, CBC.  Will continue with PremPro.0.3/1.5 mg daily.  See Epic orders.  Reviewed risks of thromboembolic events and breast cancer.  Declines refill on Premarin cream.  Currently has enough.  Return annually or prn   An After Visit Summary was printed and given to the patient.

## 2014-04-07 LAB — COMPREHENSIVE METABOLIC PANEL
ALBUMIN: 4.2 g/dL (ref 3.5–5.2)
ALK PHOS: 41 U/L (ref 39–117)
ALT: 21 U/L (ref 0–35)
AST: 17 U/L (ref 0–37)
BUN: 23 mg/dL (ref 6–23)
CALCIUM: 10 mg/dL (ref 8.4–10.5)
CHLORIDE: 104 meq/L (ref 96–112)
CO2: 21 mEq/L (ref 19–32)
Creat: 0.85 mg/dL (ref 0.50–1.10)
Glucose, Bld: 82 mg/dL (ref 70–99)
POTASSIUM: 4.2 meq/L (ref 3.5–5.3)
SODIUM: 138 meq/L (ref 135–145)
TOTAL PROTEIN: 7 g/dL (ref 6.0–8.3)
Total Bilirubin: 0.6 mg/dL (ref 0.2–1.2)

## 2014-04-07 LAB — LIPID PANEL
Cholesterol: 199 mg/dL (ref 0–200)
HDL: 51 mg/dL (ref >39)
LDL CALC: 111 mg/dL — AB (ref 0–99)
Total CHOL/HDL Ratio: 3.9 Ratio
Triglycerides: 183 mg/dL — ABNORMAL HIGH (ref <150)
VLDL: 37 mg/dL (ref 0–40)

## 2014-04-07 LAB — VITAMIN D 25 HYDROXY (VIT D DEFICIENCY, FRACTURES): Vit D, 25-Hydroxy: 51 ng/mL (ref 30–89)

## 2014-04-11 LAB — IPS PAP TEST WITH HPV

## 2014-04-14 ENCOUNTER — Telehealth: Payer: Self-pay | Admitting: Obstetrics and Gynecology

## 2014-04-14 DIAGNOSIS — R87811 Vaginal high risk human papillomavirus (HPV) DNA test positive: Principal | ICD-10-CM

## 2014-04-14 DIAGNOSIS — R87620 Atypical squamous cells of undetermined significance on cytologic smear of vagina (ASC-US): Secondary | ICD-10-CM

## 2014-04-14 NOTE — Telephone Encounter (Signed)
Phone call to discuss pap results. Had to leave message on her cell phone to return my call.  No details given.   Pap showed ASCUS and positive HR HPV.   Needs another colposcopy with me.

## 2014-04-15 NOTE — Telephone Encounter (Signed)
Patient returned phone call to me.  I discussed pap results and recommendation for colposcopy.  Will place an order for this and will schedule after precert completed.

## 2014-04-27 ENCOUNTER — Telehealth: Payer: Self-pay | Admitting: Obstetrics and Gynecology

## 2014-04-27 NOTE — Telephone Encounter (Signed)
Left message for patient to call back. Need to go over benefits and schedule colpo

## 2014-04-27 NOTE — Telephone Encounter (Signed)
Spoke withb patient. Advised that per benefit quote received, she will be responsible to pay $73.76 when she comes in for colpo. Patient agreeable. Scheduled colpo 08.10.2015.  Mailed the In-Office procedure form that includes appointment date and time, patient copay, and cancellation policy.

## 2014-06-01 ENCOUNTER — Encounter: Payer: Self-pay | Admitting: Obstetrics and Gynecology

## 2014-06-01 ENCOUNTER — Ambulatory Visit (INDEPENDENT_AMBULATORY_CARE_PROVIDER_SITE_OTHER): Payer: 59 | Admitting: Obstetrics and Gynecology

## 2014-06-01 VITALS — BP 130/70 | HR 100 | Ht 63.5 in | Wt 166.0 lb

## 2014-06-01 DIAGNOSIS — R87811 Vaginal high risk human papillomavirus (HPV) DNA test positive: Secondary | ICD-10-CM

## 2014-06-01 DIAGNOSIS — R87620 Atypical squamous cells of undetermined significance on cytologic smear of vagina (ASC-US): Secondary | ICD-10-CM

## 2014-06-01 DIAGNOSIS — IMO0002 Reserved for concepts with insufficient information to code with codable children: Secondary | ICD-10-CM | POA: Insufficient documentation

## 2014-06-01 NOTE — Progress Notes (Addendum)
Subjective:     Patient ID: Rebecca Terry, female   DOB: [birth year], 63 y.o.   MRN: [id number]  HPI  Patient here for colposcopy for pap 04/06/14 showing ASCUS and positive HR HPV. Had colposcopy 04/07/13 showing LGSIL. No prior treatment of cervical dysplasia.   Review of Systems  See HPI.    Objective:   Physical Exam  Genitourinary:        Colposcopy of cervix and vulva. Consent for procedure.  Speculum placed in vagina.  3% acetic acid in vagina.  Satisfactory colposcopy.  ECC taken and sent to pathology. Biopsy of exocervix at 3 o'clock and sent to pathology. Minimal EBL. Monsel's placed.  3% acetic acid to vulva. Thin acetowhite change to the bilateral labia minora. Thickened acetowhite change to the perineum. Sterile prep of perineum with Hibiclens. Local 1% lidocaine.  Lot number   [id number]                  , expiration:         03/22/15            3 mm punch biopsy to perineal body. Tissue to pathology.  AgNO3. Good hemostasis. No complications.     Assessment:     ASCUS pap and positive HR HPV. 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 Potential vulvar dysplasia.    Plan:     Follow up biopsies.

## 2014-06-01 NOTE — Patient Instructions (Signed)
Colposcopy Care After Colposcopy is a procedure in which a special tool is used to magnify the surface of the cervix. A tissue sample (biopsy) may also be taken. This sample will be looked at for cervical cancer or other problems. After the test:  You may have some cramping.  Lie down for a few minutes if you feel lightheaded.   You may have some bleeding which should stop in a few days. HOME CARE  Do not have sex or use tampons for 2 to 3 days or as told.  Only take medicine as told by your doctor.  Continue to take your birth control pills as usual. Finding out the results of your test Ask when your test results will be ready. Make sure you get your test results. GET HELP RIGHT AWAY IF:  You are bleeding a lot or are passing blood clots.  You develop a fever of 102 F (38.9 C) or higher.  You have abnormal vaginal discharge.  You have cramps that do not go away with medicine.  You feel lightheaded, dizzy, or pass out (faint). MAKE SURE YOU:   Understand these instructions.  Will watch your condition.  Will get help right away if you are not doing well or get worse. Document Released: 03/25/2008 Document Revised: 12/30/2011 Document Reviewed: 05/06/2013 [facility] Patient Information 2015 [place], [place]. This information is not intended to replace advice given to you by your health care provider. Make sure you discuss any questions you have with your health care provider.

## 2014-06-06 LAB — IPS OTHER TISSUE BIOPSY

## 2014-06-09 ENCOUNTER — Telehealth: Payer: Self-pay | Admitting: Obstetrics and Gynecology

## 2014-06-09 NOTE — Telephone Encounter (Signed)
Phone call to patient regarding colposcopy results.   Patient states her rectocele is somewhat symptomatic but she is not ready for surgery yet.  Bladder is not affecting her daily activity.  Results of the colposcopy reviewed.   Plan for annual exam in one year.  Colposcopy to follow.   Offered to send to physical therapy for pelvic floor.  Declines currently.

## 2014-06-09 NOTE — Telephone Encounter (Signed)
Return call to [place].

## 2014-06-09 NOTE — Telephone Encounter (Signed)
I left a message on patient's cell phone to call.  Colposcopy results showing LGSIL of the cervix and VIN 1 of the perianal region.  No treatment needed.  Next pap and colposcopy in one year.

## 2014-06-15 ENCOUNTER — Encounter: Payer: Self-pay | Admitting: Interventional Cardiology

## 2014-06-15 ENCOUNTER — Encounter: Payer: Self-pay | Admitting: Cardiology

## 2014-06-15 ENCOUNTER — Ambulatory Visit (INDEPENDENT_AMBULATORY_CARE_PROVIDER_SITE_OTHER): Payer: 59 | Admitting: Interventional Cardiology

## 2014-06-15 VITALS — BP 142/78 | HR 59 | Ht 63.5 in | Wt 166.0 lb

## 2014-06-15 DIAGNOSIS — I10 Essential (primary) hypertension: Secondary | ICD-10-CM

## 2014-06-15 DIAGNOSIS — I359 Nonrheumatic aortic valve disorder, unspecified: Secondary | ICD-10-CM

## 2014-06-15 NOTE — Progress Notes (Signed)
Patient ID: Rebecca Terry, female   DOB: 1951/02/18, 63 y.o.   MRN: [id number]    [place], [address] [place], [place]  [address] Phone: [phone] Fax:  [phone]  Date:  06/15/2014   ID:  Rebecca Terry, DOB 1951-03-13, MRN [id number]  PCP:  Rebecca Naas, MD      History of Present Illness: Rebecca Terry is a 63 y.o. female with HTN and aortic insufficiency. SHe has been feeling very well. She had GYN surgery in the past several years without caridac issues. Aortic Insufficiency:  Walks up to 2 miles daily.  Swimming 4-5x/week.  Denies : Chest pain.  Shortness of breath.  Palpitations.  Syncope.   No swelling.  Wt Readings from Last 3 Encounters:  06/15/14 166 lb (75.297 kg)  06/01/14 166 lb (75.297 kg)  04/06/14 164 lb 12.8 oz (74.753 kg)     Past Medical History  Diagnosis Date  . Fibroid     posterior  . Hypertension   . Urinary incontinence   . Aortic valve disorders   . Essential hypertension, benign   . Herpes genitalis in women   . Rectocele   . Gilbert's syndrome   . Diverticulosis     Current Outpatient Prescriptions  Medication Sig Dispense Refill  . conjugated estrogens (PREMARIN) vaginal cream Place 1 Applicatorful vaginally as needed.       Marland Kitchen estrogen, conjugated,-medroxyprogesterone (PREMPRO) 0.3-1.5 MG per tablet Take 1 tablet by mouth daily.  90 tablet  3  . lisinopril (PRINIVIL,ZESTRIL) 5 MG tablet Take 5 mg by mouth daily.       . Multiple Vitamins-Minerals (MULTIVITAMIN PO) Take 1 tablet by mouth daily.        No current facility-administered medications for this visit.    Allergies:    Allergies  Allergen Reactions  . Adhesive [Tape] Rash    blisters  . Latex Rash    blisters  . Penicillins Rash    Social History:  The patient  reports that she quit smoking about 21 years ago. Her smoking use included Cigarettes. She has a 10 pack-year smoking history. She has never used smokeless tobacco. She reports  that she drinks about one ounce of alcohol per week. She reports that she does not use illicit drugs.   Family History:  The patient's family history includes Asthma in her mother; Congestive Heart Failure in her father; Multiple sclerosis in her father.   ROS:  Please see the history of present illness.  No nausea, vomiting.  No fevers, chills.  No focal weakness.  Mild left calf soreness after a cramp.  No dysuria.    All other systems reviewed and negative.   PHYSICAL EXAM: VS:  BP 142/78  Pulse 59  Ht 5' 3.5" (1.613 m)  Wt 166 lb (75.297 kg)  BMI 28.94 kg/m2  LMP 10/21/1992 Well nourished, well developed, in no acute distress HEENT: normal Neck: no JVD, no carotid bruits Cardiac:  normal S1, S2; RRR;  Lungs:  clear to auscultation bilaterally, no wheezing, rhonchi or rales Abd: soft, nontender, no hepatomegaly Ext: no edema Skin: warm and dry Neuro:   no focal abnormalities noted  EKG:  Sinus bradycardia, no ST segment changes   ASSESSMENT AND PLAN:  Aortic insufficiency  No sx of CHF at this time. Echo to be rechecked. LV function appeared normal. No increase in her AI in 2009. COntinue BP control and risk factor modification.    2. Essential hypertension, benign  If systolic BP > 751 chronically at home, would increase lisinopril to 10 mg dialy. BP was better controlled at another appt.   Base f/u on echo result.    Preventive Medicine  Adult topics discussed:  Diet: Lots of fruits and vegetables, 64 ounces of water daily, weight loss.  Exercise: 5 days a week, at least 30 minutes of aerobic exercise.      Signed, Mina Marble, MD, [facility] 06/15/2014 10:00 AM

## 2014-06-15 NOTE — Patient Instructions (Signed)

## 2014-06-22 ENCOUNTER — Ambulatory Visit (HOSPITAL_COMMUNITY): Payer: 59 | Attending: Cardiovascular Disease | Admitting: Radiology

## 2014-06-22 DIAGNOSIS — I359 Nonrheumatic aortic valve disorder, unspecified: Secondary | ICD-10-CM | POA: Insufficient documentation

## 2014-06-22 DIAGNOSIS — I10 Essential (primary) hypertension: Secondary | ICD-10-CM | POA: Insufficient documentation

## 2014-06-22 DIAGNOSIS — Z87891 Personal history of nicotine dependence: Secondary | ICD-10-CM | POA: Diagnosis not present

## 2014-06-22 DIAGNOSIS — I059 Rheumatic mitral valve disease, unspecified: Secondary | ICD-10-CM | POA: Insufficient documentation

## 2014-06-22 NOTE — Progress Notes (Signed)
Echocardiogram performed.  

## 2014-06-23 ENCOUNTER — Telehealth: Payer: Self-pay | Admitting: Interventional Cardiology

## 2014-06-23 ENCOUNTER — Encounter: Payer: Self-pay | Admitting: Interventional Cardiology

## 2014-06-23 NOTE — Telephone Encounter (Signed)
New problem   Pt returning call. Stated you can leave message.

## 2014-06-23 NOTE — Telephone Encounter (Signed)
Called pt back with Echo results.

## 2014-09-06 ENCOUNTER — Telehealth: Payer: Self-pay | Admitting: Obstetrics and Gynecology

## 2014-09-06 NOTE — Telephone Encounter (Signed)
Left message upcoming appointment 03/2015 has been canceled and rescheduled.

## 2014-09-21 ENCOUNTER — Other Ambulatory Visit (HOSPITAL_COMMUNITY): Payer: Self-pay | Admitting: Family Medicine

## 2014-09-21 DIAGNOSIS — Z1231 Encounter for screening mammogram for malignant neoplasm of breast: Secondary | ICD-10-CM

## 2014-10-05 ENCOUNTER — Ambulatory Visit (HOSPITAL_COMMUNITY)
Admission: RE | Admit: 2014-10-05 | Discharge: 2014-10-05 | Disposition: A | Discharge status: Home / Self Care | Payer: 59 | Source: Ambulatory Visit | Attending: Family Medicine | Admitting: Family Medicine

## 2014-10-05 DIAGNOSIS — Z1231 Encounter for screening mammogram for malignant neoplasm of breast: Secondary | ICD-10-CM | POA: Diagnosis present

## 2015-03-09 ENCOUNTER — Ambulatory Visit: Payer: 59 | Admitting: Sports Medicine

## 2015-03-30 ENCOUNTER — Other Ambulatory Visit: Payer: Self-pay | Admitting: Obstetrics and Gynecology

## 2015-03-30 MED ORDER — CONJ ESTROG-MEDROXYPROGEST ACE 0.3-1.5 MG PO TABS: 1.0000 | ORAL_TABLET | Freq: Every day | ORAL | Status: DC

## 2015-03-30 NOTE — Telephone Encounter (Signed)
Medication refill request: Prempro  Last AEX:  04/06/14 Dr. Quincy Simmonds Next AEX: 05/08/15 Dr. Quincy Simmonds Last MMG (if hormonal medication request): 10/05/14 [id number]:Neg  Refill authorized: 04/06/14 #[id number] w/ 3R. Today please advise.

## 2015-03-30 NOTE — Telephone Encounter (Signed)
Refill of Prempro   Pharmacy:  [place], [address]

## 2015-03-30 NOTE — Telephone Encounter (Signed)
I will authorize PremPro 0.3/1.5 mg  Sig - one po q day Disp - 90 RF zero.  Thank you.

## 2015-03-30 NOTE — Telephone Encounter (Signed)
Rx sent to pharmacy   

## 2015-04-12 ENCOUNTER — Ambulatory Visit: Payer: 59 | Admitting: Obstetrics and Gynecology

## 2015-05-08 ENCOUNTER — Ambulatory Visit (INDEPENDENT_AMBULATORY_CARE_PROVIDER_SITE_OTHER): Payer: 59 | Admitting: Obstetrics and Gynecology

## 2015-05-08 ENCOUNTER — Encounter: Payer: Self-pay | Admitting: Obstetrics and Gynecology

## 2015-05-08 VITALS — BP 140/80 | HR 80 | Resp 16 | Ht 63.25 in | Wt 165.4 lb

## 2015-05-08 DIAGNOSIS — Z0000 Encounter for general adult medical examination without abnormal findings: Secondary | ICD-10-CM | POA: Diagnosis not present

## 2015-05-08 DIAGNOSIS — Z01419 Encounter for gynecological examination (general) (routine) without abnormal findings: Secondary | ICD-10-CM

## 2015-05-08 DIAGNOSIS — Z79890 Hormone replacement therapy (postmenopausal): Secondary | ICD-10-CM

## 2015-05-08 LAB — LIPID PANEL
CHOLESTEROL: 177 mg/dL (ref 0–200)
HDL: 61 mg/dL (ref >=46)
LDL CALC: 85 mg/dL (ref 0–99)
Total CHOL/HDL Ratio: 2.9 Ratio
Triglycerides: 154 mg/dL — ABNORMAL HIGH (ref <150)
VLDL: 31 mg/dL (ref 0–40)

## 2015-05-08 LAB — CBC
HEMATOCRIT: 38.3 % (ref 36.0–46.0)
HEMOGLOBIN: 12.7 g/dL (ref 12.0–15.0)
MCH: 31.4 pg (ref 26.0–34.0)
MCHC: 33.2 g/dL (ref 30.0–36.0)
MCV: 94.8 fL (ref 78.0–100.0)
MPV: 9.2 fL (ref 8.6–12.4)
PLATELETS: 250 10*3/uL (ref 150–400)
RBC: 4.04 MIL/uL (ref 3.87–5.11)
RDW: 14.2 % (ref 11.5–15.5)
WBC: 8.6 10*3/uL (ref 4.0–10.5)

## 2015-05-08 LAB — COMPREHENSIVE METABOLIC PANEL
ALT: 16 U/L (ref 0–35)
AST: 16 U/L (ref 0–37)
Albumin: 3.9 g/dL (ref 3.5–5.2)
Alkaline Phosphatase: 39 U/L (ref 39–117)
BILIRUBIN TOTAL: 1 mg/dL (ref 0.2–1.2)
BUN: 15 mg/dL (ref 6–23)
CALCIUM: 9.5 mg/dL (ref 8.4–10.5)
CHLORIDE: 103 meq/L (ref 96–112)
CO2: 26 mEq/L (ref 19–32)
Creat: 0.75 mg/dL (ref 0.50–1.10)
GLUCOSE: 74 mg/dL (ref 70–99)
POTASSIUM: 3.9 meq/L (ref 3.5–5.3)
Sodium: 141 mEq/L (ref 135–145)
Total Protein: 6.9 g/dL (ref 6.0–8.3)

## 2015-05-08 MED ORDER — CONJ ESTROG-MEDROXYPROGEST ACE 0.3-1.5 MG PO TABS: 1.0000 | ORAL_TABLET | Freq: Every day | ORAL | Status: DC

## 2015-05-08 NOTE — Patient Instructions (Signed)

## 2015-05-08 NOTE — Progress Notes (Signed)
Patient ID: Rebecca Terry, female   DOB: [birth year], 64 y.o.   MRN: [id number] 63 y.o. [id number] Single Caucasian female here for annual exam.    Colposcopy in August 2015 showed LGSIL of cervix and of the perineum.  ECC was negative.  Still with some leakage of bladder and has rectocele.   Asking about stopping HRT. Had hot flashes and cognitive change when she did this in the past.   PCP:  Carol Ada, MD  Patient's last menstrual period was 10/21/1992.          Sexually active: No. female The current method of family planning is post menopausal status.    Exercising: Yes.    swimming and walking. 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 MMG:  10-06-14 Density Cat:C/neg:[facility] Colonoscopy:  2012 diverticulosis/nl with Dr. Teena Irani. Next one due 2022. BMD:   03-24-13  Result  [facility] TDaP:  03/2013 Screening Labs:  Hb today: 13.3, Urine today: Neg   reports that she quit smoking about 22 years ago. Her smoking use included Cigarettes. She has a 10 pack-year smoking history. She has never used smokeless tobacco. She reports that she drinks about 3.0 oz of alcohol per week. She reports that she does not use illicit drugs.  Past Medical History  Diagnosis Date  . Fibroid     posterior  . Hypertension   . Urinary incontinence   . Aortic valve disorders   . Essential hypertension, benign   . Herpes genitalis in women   . Rectocele   . Gilbert's syndrome   . Diverticulosis   . 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 Bilateral oophorectomy  2010    Dr. Quincy Simmonds  . Pelvic laparoscopy      x2--ectopics  . Carpal  tunnel release Bilateral   . Arthroscopic knee Left     Current Outpatient Prescriptions  Medication Sig Dispense Refill  . estrogen, conjugated,-medroxyprogesterone (PREMPRO) 0.3-1.5 MG per tablet Take 1 tablet by mouth daily. 90 tablet 0  . lisinopril (PRINIVIL,ZESTRIL) 5 MG tablet Take 5 mg by mouth daily.     . Multiple Vitamins-Minerals (MULTIVITAMIN PO) Take 1 tablet by mouth daily.      No current facility-administered medications for this visit.    Family History  Problem Relation Age of Onset  . Asthma Mother   . Congestive Heart Failure Father   . Multiple sclerosis Father     ROS:  Pertinent items are noted in HPI.  Otherwise, a comprehensive ROS was negative.  Exam:   BP 140/80 mmHg  Pulse 80  Resp 16  Ht 5' 3.25" (1.607 m)  Wt 165 lb 6.4 oz (75.025 kg)  BMI 29.05 kg/m2  LMP 10/21/1992    General appearance: alert, cooperative and appears stated age Head: Normocephalic, without obvious abnormality, atraumatic Neck: no adenopathy, supple, symmetrical, trachea midline and thyroid normal to inspection and palpation Lungs: clear to auscultation bilaterally Breasts: normal appearance, no masses or tenderness, Inspection negative, No nipple retraction or dimpling, No nipple discharge or bleeding, No axillary or supraclavicular adenopathy Heart: regular rate and rhythm Abdomen: soft, non-tender; bowel sounds normal;  no masses,  no organomegaly Extremities: extremities normal, atraumatic, no cyanosis or edema Skin: Skin color, texture, turgor normal. No rashes or lesions Lymph nodes: Cervical, supraclavicular, and axillary nodes normal. No abnormal inguinal nodes palpated Neurologic: Grossly normal  Pelvic: External genitalia:   Whitish change of the epithelium of the perineum.  No irregularity to the skin.              Urethra:  normal appearing urethra with no masses, tenderness or lesions              Bartholins and Skenes: normal                 Vagina: normal  appearing vagina with normal color and discharge, no lesions.  No significant rectocele noted in dorsal lithotomy position.               Cervix: no lesions              Pap taken: Yes.   Bimanual Exam:  Uterus:  normal size, contour, position, consistency, mobility, non-tender              Adnexa: no mass, fullness, tenderness              Rectovaginal: Yes.  .  Confirms.              Anus:  normal sphincter tone, no lesions  Chaperone was present for exam.  Assessment:   Well woman visit with normal exam. LGSIL and VIN I. Osteopenia - very mild in spine.  HRT patient.  Rectocele.  Plan: Yearly mammogram recommended after age 47.  Recommended self breast exam.  Pap and HR HPV as above. Discussed Calcium, Vitamin D, regular exercise program including cardiovascular and weight bearing exercise. Labs performed.  Yes.  .   See orders. Refills given on medications.  Yes.  .  See orders.  PremPro.  Discussed risks of DVT, PE, MI, stroke, breast cancer.  Will wean off in 3 months.  Will do colpo of the vulva.  Will await pap to see if needs colpo of the cervix as well. Dicussed methods to treat constipation.  Discussed possible pessary.  Will consider.  Will wait to do bone density next year.  Follow up annually and prn.      After visit summary provided.

## 2015-05-09 LAB — VITAMIN D 25 HYDROXY (VIT D DEFICIENCY, FRACTURES): Vit D, 25-Hydroxy: 46 ng/mL (ref 30–100)

## 2015-05-09 LAB — TSH: TSH: 1.687 u[IU]/mL (ref 0.350–4.500)

## 2015-05-09 LAB — HEMOGLOBIN, FINGERSTICK: Hemoglobin, fingerstick: 13.3 g/dL (ref 12.0–16.0)

## 2015-05-11 LAB — IPS PAP TEST WITH HPV

## 2015-05-15 ENCOUNTER — Telehealth: Payer: Self-pay | Admitting: Emergency Medicine

## 2015-05-15 ENCOUNTER — Telehealth: Payer: Self-pay | Admitting: Obstetrics and Gynecology

## 2015-05-15 DIAGNOSIS — R87810 Cervical high risk human papillomavirus (HPV) DNA test positive: Secondary | ICD-10-CM

## 2015-05-15 NOTE — Telephone Encounter (Signed)
Thank you for the update and for keeping patient in recall for her colposcopy. Hopefully we can do this in August.

## 2015-05-15 NOTE — Telephone Encounter (Signed)
Patient returned call. Message from Dr. Quincy Simmonds given.  Patient agreeable to procedures but wishes to call back next week to schedule procedures based on work when she has her calendar available. Patient in current recall, will keep in recall at this time until schedules procedures.  Advised she will be contacted with procedure costs for colposcopy of vulva, cervix and vagina. Patient agreeable.   cc Kerry Hough.   Routing update to Dr. Quincy Simmonds.

## 2015-05-15 NOTE — Telephone Encounter (Signed)
Patient wants to speak with Olivia Mackie about scheduling a colpo.

## 2015-05-15 NOTE — Telephone Encounter (Signed)
-----   Message from Nunzio Cobbs, MD sent at 05/11/2015  2:03 PM EDT ----- Please contact patient with pap showing normal cells but positive HR HPV.  Vulva exam at time of annual looked like potential dysplasia. I have already shared with the patient that she needs a colposcopy of the vulva.  Now I would like to colpo the cervix and vagina as well.   Please send to precert to do colpo of both areas and schedule with the patient.   Cc- Marisa Sprinkles

## 2015-05-15 NOTE — Telephone Encounter (Signed)
Message left to return call to Tiffeny Minchew at [phone].    

## 2015-05-15 NOTE — Telephone Encounter (Signed)
Message left to return call to Dov Dill at [phone].    

## 2015-05-17 NOTE — Telephone Encounter (Signed)
Patient returning call.

## 2015-05-17 NOTE — Telephone Encounter (Signed)
Patient returned call and scheduled colposcopy with Dr. Quincy Simmonds for 06/07/15 at 1500.      Instructions given. Motrin 800 mg po one hour before appointment with food. Make sure to eat a meal before appointment and drink plenty of fluids.   Patient agreeable and verbalized understanding of all instructions.   Advised patient will be contacted with insurance coverage.   cc Kerry Hough  Routing to provider for final review. Patient agreeable to disposition. Will close encounter.

## 2015-05-31 ENCOUNTER — Telehealth: Payer: Self-pay | Admitting: Obstetrics and Gynecology

## 2015-05-31 NOTE — Telephone Encounter (Signed)
Please call patient about scheduled appointment.

## 2015-06-02 ENCOUNTER — Telehealth: Payer: Self-pay | Admitting: Interventional Cardiology

## 2015-06-02 NOTE — Telephone Encounter (Signed)
New Message  Pt called requests a call back to discuss if she is do for an ECHO.

## 2015-06-02 NOTE — Telephone Encounter (Signed)
Advised patient that the need for repeat echo will be addressed at office visit next month.  She is agreeable to this plan.

## 2015-06-07 ENCOUNTER — Ambulatory Visit (INDEPENDENT_AMBULATORY_CARE_PROVIDER_SITE_OTHER): Payer: 59 | Admitting: Obstetrics and Gynecology

## 2015-06-07 ENCOUNTER — Encounter: Payer: Self-pay | Admitting: Obstetrics and Gynecology

## 2015-06-07 DIAGNOSIS — R87810 Cervical high risk human papillomavirus (HPV) DNA test positive: Secondary | ICD-10-CM | POA: Diagnosis not present

## 2015-06-07 NOTE — Patient Instructions (Signed)

## 2015-06-07 NOTE — Progress Notes (Signed)
Subjective:     Patient ID: Rebecca Terry, female   DOB: 29-Apr-1951, 64 y.o.   MRN: [id number]  HPI  Patient here today for colposcopy.  Pap smear 05-08-15 normal:Pos HR HPV.  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  Review of Systems  LMP:  10-21-92 Contraception:postmenopausal     Objective:   Physical Exam  Genitourinary:        Colposcopy Consent for procedure. Speculum placed in vagina.  3% acetic acid placed.  Satisfactory colposcopy.  ECC performed and sent to pathology.  Biopsy of thin rim of acetowhite change at 3:00 to pathology.  Monsel's placed.  Minimal EBL.  No complications.     Assessment:     Normal pap and positive HR HPV status.     Plan:     Instructions and precautions given.  Follow up biopsies.  Expect cotesting in one year.   After visit summary to patient.

## 2015-06-08 ENCOUNTER — Encounter: Payer: Self-pay | Admitting: Obstetrics and Gynecology

## 2015-06-13 LAB — IPS OTHER TISSUE BIOPSY

## 2015-06-15 ENCOUNTER — Telehealth: Payer: Self-pay | Admitting: *Deleted

## 2015-06-15 NOTE — Telephone Encounter (Signed)
-----   Message from Nunzio Cobbs, MD sent at 06/13/2015  5:30 PM EDT ----- Results to patient through My Chart.   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  Exocervix bx - LGSIL. ECC - negative.  I recommend cotesting in one year.  Please enter 08 recall.   Cc- Marisa Sprinkles

## 2015-06-15 NOTE — Telephone Encounter (Signed)
Call to patient, left message to call back for triage.

## 2015-06-15 NOTE — Telephone Encounter (Signed)
Return call to patient. Confirms she received My Chart message and denies questions. Annual exam is scheduled for 05-23-16 and stressed importance of follow-up testing in one year at annual. Patient agreeable.  08 Recall entered.  Routing to provider for final review.  Will close encounter.

## 2015-06-15 NOTE — Telephone Encounter (Signed)
Returned call

## 2015-06-26 ENCOUNTER — Emergency Department (HOSPITAL_COMMUNITY)
Admission: EM | Admit: 2015-06-26 | Discharge: 2015-06-26 | Disposition: A | Discharge status: Home / Self Care | Payer: 59 | Attending: Emergency Medicine | Admitting: Emergency Medicine

## 2015-06-26 ENCOUNTER — Encounter (HOSPITAL_COMMUNITY): Payer: Self-pay | Admitting: *Deleted

## 2015-06-26 DIAGNOSIS — Y9289 Other specified places as the place of occurrence of the external cause: Secondary | ICD-10-CM | POA: Insufficient documentation

## 2015-06-26 DIAGNOSIS — Z8719 Personal history of other diseases of the digestive system: Secondary | ICD-10-CM | POA: Diagnosis not present

## 2015-06-26 DIAGNOSIS — Z8639 Personal history of other endocrine, nutritional and metabolic disease: Secondary | ICD-10-CM | POA: Diagnosis not present

## 2015-06-26 DIAGNOSIS — Z792 Long term (current) use of antibiotics: Secondary | ICD-10-CM | POA: Diagnosis not present

## 2015-06-26 DIAGNOSIS — Z8619 Personal history of other infectious and parasitic diseases: Secondary | ICD-10-CM | POA: Insufficient documentation

## 2015-06-26 DIAGNOSIS — Z87448 Personal history of other diseases of urinary system: Secondary | ICD-10-CM | POA: Diagnosis not present

## 2015-06-26 DIAGNOSIS — Z880 Allergy status to penicillin: Secondary | ICD-10-CM | POA: Insufficient documentation

## 2015-06-26 DIAGNOSIS — T7849XA Other allergy, initial encounter: Secondary | ICD-10-CM | POA: Diagnosis not present

## 2015-06-26 DIAGNOSIS — X58XXXA Exposure to other specified factors, initial encounter: Secondary | ICD-10-CM | POA: Diagnosis not present

## 2015-06-26 DIAGNOSIS — Z87891 Personal history of nicotine dependence: Secondary | ICD-10-CM | POA: Diagnosis not present

## 2015-06-26 DIAGNOSIS — Z91040 Latex allergy status: Secondary | ICD-10-CM | POA: Insufficient documentation

## 2015-06-26 DIAGNOSIS — Y9389 Activity, other specified: Secondary | ICD-10-CM | POA: Insufficient documentation

## 2015-06-26 DIAGNOSIS — R220 Localized swelling, mass and lump, head: Secondary | ICD-10-CM | POA: Diagnosis present

## 2015-06-26 DIAGNOSIS — I10 Essential (primary) hypertension: Secondary | ICD-10-CM | POA: Diagnosis not present

## 2015-06-26 DIAGNOSIS — Y998 Other external cause status: Secondary | ICD-10-CM | POA: Diagnosis not present

## 2015-06-26 DIAGNOSIS — T7840XA Allergy, unspecified, initial encounter: Secondary | ICD-10-CM

## 2015-06-26 DIAGNOSIS — Z86018 Personal history of other benign neoplasm: Secondary | ICD-10-CM | POA: Diagnosis not present

## 2015-06-26 DIAGNOSIS — Z79899 Other long term (current) drug therapy: Secondary | ICD-10-CM | POA: Insufficient documentation

## 2015-06-26 MED ORDER — DIPHENHYDRAMINE HCL 50 MG/ML IJ SOLN
25.0000 mg | Freq: Once | INTRAMUSCULAR | Status: AC
  Administered 2015-06-26: 25 mg via INTRAVENOUS
  Filled 2015-06-26: qty 1

## 2015-06-26 MED ORDER — RANITIDINE HCL 150 MG PO TABS: 150.0000 mg | ORAL_TABLET | Freq: Two times a day (BID) | ORAL | Status: DC

## 2015-06-26 MED ORDER — DIPHENHYDRAMINE HCL 25 MG PO TABS: 50.0000 mg | ORAL_TABLET | Freq: Four times a day (QID) | ORAL | Status: DC

## 2015-06-26 MED ORDER — FAMOTIDINE IN NACL 20-0.9 MG/50ML-% IV SOLN
20.0000 mg | Freq: Once | INTRAVENOUS | Status: AC
  Administered 2015-06-26: 20 mg via INTRAVENOUS
  Filled 2015-06-26: qty 50

## 2015-06-26 MED ORDER — PREDNISONE 10 MG PO TABS: 50.0000 mg | ORAL_TABLET | Freq: Every day | ORAL | Status: DC

## 2015-06-26 MED ORDER — METHYLPREDNISOLONE SODIUM SUCC 125 MG IJ SOLR
125.0000 mg | Freq: Once | INTRAMUSCULAR | Status: AC
Start: 2015-06-26 — End: 2015-06-26
  Administered 2015-06-26: 125 mg via INTRAVENOUS
  Filled 2015-06-26: qty 2

## 2015-06-26 NOTE — Discharge Instructions (Signed)

## 2015-06-26 NOTE — ED Provider Notes (Signed)
CSN: [id number]     Arrival date & time 06/26/15  2106 History   First MD Initiated Contact with Patient 06/26/15 2155     Chief Complaint  Patient presents with  . Allergic Reaction  . Angioedema     (Consider location/radiation/quality/duration/timing/severity/associated sxs/prior Treatment) Patient is a 64 y.o. female presenting with allergic reaction. The history is provided by the patient.  Allergic Reaction Presenting symptoms: swelling   Swelling:    Location: right upper lip.   Onset quality:  Sudden   Duration:  1 hour   Timing:  Constant   Progression:  Unchanged   Chronicity:  New Severity:  Mild Prior episodes: latex glove allergy. Relieved by:  Nothing Worsened by:  Nothing tried Ineffective treatments:  Antihistamines   Past Medical History  Diagnosis Date  . Fibroid     posterior  . Hypertension   . Urinary incontinence   . Aortic valve disorders   . Essential hypertension, benign   . Herpes genitalis in women   . Rectocele   . Gilbert's syndrome   . Diverticulosis   . Abnormal Pap smear of cervix     09/2013 Ascus:Pos HR HPV, 03/2014 Ascus:Pos HR HPV;colpo 05/2014 LGSIL of cx and VIN 1of perianal region   Past Surgical History  Procedure Laterality Date  . Bilateral oophorectomy  2010    Dr. Quincy Simmonds  . Pelvic laparoscopy      x2--ectopics  . Carpal tunnel release Bilateral   . Arthroscopic knee Left    Family History  Problem Relation Age of Onset  . Asthma Mother   . Congestive Heart Failure Father   . Multiple sclerosis Father    Social History  Substance Use Topics  . Smoking status: Former Smoker -- 0.50 packs/day for 20 years    Types: Cigarettes    Quit date: 04/07/1993  . Smokeless tobacco: Never Used  . Alcohol Use: 3.0 oz/week    5 Standard drinks or equivalent per week     Comment: 5 glasses wine/beer per week   OB History    Gravida Para Term Preterm AB TAB SAB Ectopic Multiple Living   4 1 1  2 1  1  1      Review of  Systems  All other systems reviewed and are negative.     Allergies  Adhesive; Latex; and Penicillins  Home Medications   Prior to Admission medications   Medication Sig Start Date End Date Taking? Authorizing Provider  ciprofloxacin (CIPRO) 500 MG tablet Take 500 mg by mouth 2 (two) times daily.   Yes Historical Provider, MD  diphenhydrAMINE (BENADRYL) 25 mg capsule Take 25 mg by mouth once.   Yes Historical Provider, MD  estrogen, conjugated,-medroxyprogesterone (PREMPRO) 0.3-1.5 MG per tablet Take 1 tablet by mouth daily. 05/08/15  Yes [place], MD  ibuprofen (ADVIL,MOTRIN) 200 MG tablet Take 800 mg by mouth every 6 (six) hours as needed for moderate pain.   Yes Historical Provider, MD  lisinopril (PRINIVIL,ZESTRIL) 5 MG tablet Take 5 mg by mouth daily.    Yes Historical Provider, MD  metroNIDAZOLE (FLAGYL) 500 MG tablet Take 500 mg by mouth 2 (two) times daily.   Yes Historical Provider, MD  Multiple Vitamins-Minerals (MULTIVITAMIN PO) Take 1 tablet by mouth daily.    Yes Historical Provider, MD  saccharomyces boulardii (FLORASTOR) 250 MG capsule Take 250 mg by mouth 2 (two) times daily.   Yes Historical Provider, MD   BP 182/90 mmHg  Pulse 66  Temp(Src) 98.2 F (36.8 C) (Oral)  Resp 18  SpO2 100%  LMP 10/21/1992 Physical Exam  Constitutional: She is oriented to person, place, and time. She appears well-developed and well-nourished. No distress.  HENT:  Head: Normocephalic.  Right upper lip swelling, mild, with central clearing and mild erythema  Eyes: Conjunctivae are normal.  Neck: Neck supple. No tracheal deviation present.  Cardiovascular: Normal rate and regular rhythm.   Pulmonary/Chest: Effort normal. No respiratory distress.  Abdominal: Soft. She exhibits no distension.  Neurological: She is alert and oriented to person, place, and time.  Skin: Skin is warm and dry.  2 small interdigital areas of erythema on each hand  Psychiatric: She has a normal  mood and affect.    ED Course  Procedures (including critical care time) Labs Review Labs Reviewed - No data to display  Imaging Review No results found. I have personally reviewed and evaluated these images and lab results as part of my medical decision-making.   EKG Interpretation None      MDM   Final diagnoses:  Allergic reaction, initial encounter    64 y.o. female presents with swelling of right side of mouth and red blotchy areas over bilateral hands. Pt has known allergy to certain gloves. Complaining of facial numbness but no CN deficits or other concerning signs fir neurologic cause. Provided H1/H2 blockers and steroids with improvement of swelling, suspect local histaminergic reaction to environmental substance. Pt unsure what this might be. Recommended keeping diary of exposures, steroid burst and scheduled antihistamines. Plan to follow up with PCP as needed and return precautions discussed for worsening or new concerning symptoms.     Leo Grosser, MD 06/27/15 [phone]

## 2015-06-26 NOTE — ED Notes (Signed)
Pt reports R facial swelling and numbness x 45 minutes ago, took one 25mg   Benadryl prior to coming to the ED.  Pt at this time is denying SOB or difficulty breathing.  Pt however, reports tongue numbness.  Pt reports she takes lisinopril x 2 years now.  Pt also reports taking cipro and flagyl for diverticulitis x 7 days now.

## 2015-07-10 ENCOUNTER — Ambulatory Visit: Payer: 59 | Admitting: Interventional Cardiology

## 2015-07-22 DIAGNOSIS — Z8614 Personal history of Methicillin resistant Staphylococcus aureus infection: Secondary | ICD-10-CM

## 2015-07-22 HISTORY — DX: Personal history of Methicillin resistant Staphylococcus aureus infection: Z86.14

## 2015-08-24 ENCOUNTER — Encounter: Payer: Self-pay | Admitting: Interventional Cardiology

## 2015-08-24 ENCOUNTER — Ambulatory Visit (INDEPENDENT_AMBULATORY_CARE_PROVIDER_SITE_OTHER): Payer: 59 | Admitting: Interventional Cardiology

## 2015-08-24 VITALS — BP 136/80 | HR 66 | Ht 64.0 in | Wt 168.0 lb

## 2015-08-24 DIAGNOSIS — I10 Essential (primary) hypertension: Secondary | ICD-10-CM | POA: Diagnosis not present

## 2015-08-24 DIAGNOSIS — E781 Pure hyperglyceridemia: Secondary | ICD-10-CM | POA: Diagnosis not present

## 2015-08-24 DIAGNOSIS — I351 Nonrheumatic aortic (valve) insufficiency: Secondary | ICD-10-CM

## 2015-08-24 DIAGNOSIS — I359 Nonrheumatic aortic valve disorder, unspecified: Secondary | ICD-10-CM

## 2015-08-24 LAB — BASIC METABOLIC PANEL
BUN: 19 mg/dL (ref 7–25)
CHLORIDE: 104 mmol/L (ref 98–110)
CO2: 25 mmol/L (ref 20–31)
CREATININE: 0.73 mg/dL (ref 0.50–0.99)
Calcium: 9.5 mg/dL (ref 8.6–10.4)
GLUCOSE: 91 mg/dL (ref 65–99)
POTASSIUM: 4 mmol/L (ref 3.5–5.3)
Sodium: 137 mmol/L (ref 135–146)

## 2015-08-24 NOTE — Patient Instructions (Signed)
Medication Instructions:  None  Labwork: BMET today  Testing/Procedures: None  Follow-Up: Your physician wants you to follow-up in: 1 year with Dr. Irish Lack. You will receive a reminder letter in the mail two months in advance. If you don't receive a letter, please call our office to schedule the follow-up appointment.   Any Other Special Instructions Will Be Listed Below (If Applicable).     If you need a refill on your cardiac medications before your next appointment, please call your pharmacy.

## 2015-08-24 NOTE — Progress Notes (Signed)
Patient ID: Rebecca Terry, female   DOB: [birth year], 64 y.o.   MRN: [id number]     Cardiology Office Note   Date:  08/24/2015   ID:  Rebecca Terry, DOB [birth year], MRN [id number]  PCP:  Reginia Naas, MD    No chief complaint on file. f/u AI   Wt Readings from Last 3 Encounters:  08/24/15 168 lb (76.204 kg)  06/07/15 165 lb (74.844 kg)  05/08/15 165 lb 6.4 oz (75.025 kg)       History of Present Illness: Rebecca Terry is a 64 y.o. female   with HTN and aortic insufficiency. SHe has been feeling very well. Had one trip to ER.  See below. Aortic Insufficiency:  Walks up to 2 miles daily but less recently. Swimming 4-5x/week during the summer, but none recently.  Denies : Chest pain.  Shortness of breath.  Palpitations.  Syncope.   Had an episode of unilateral facial swelling and went to [facility] ER.  THere was a question of angioedema.  Lisinopril was changed to losartan.  BPs up to 145.  Losartan was increased to 50 mg daily recently.  There was a question of whether this was early Bells palsy.  Sx resolved before she hd to take Prednisone.  She will be trying to wean of the PremPro for hot flashes.    Past Medical History  Diagnosis Date  . Fibroid     posterior  . Hypertension   . Urinary incontinence   . Aortic valve disorders   . Essential hypertension, benign   . Herpes genitalis in women   . Rectocele   . Gilbert's syndrome   . Diverticulosis   . 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 Bilateral oophorectomy  2010    Dr. Quincy Simmonds  . Pelvic laparoscopy      x2--ectopics  . Carpal tunnel release Bilateral   . Arthroscopic knee Left      Current Outpatient Prescriptions  Medication Sig Dispense Refill  . clobetasol cream (TEMOVATE) 2.09 % Apply 1 application topically daily as needed.   1    . estrogen, conjugated,-medroxyprogesterone (PREMPRO) 0.3-1.5 MG per tablet Take 1 tablet by mouth daily. 90 tablet 1  . losartan (COZAAR) 50 MG tablet Take 50 mg by mouth daily.   5  . valACYclovir (VALTREX) 500 MG tablet Take 500 mg by mouth daily as needed.   3   No current facility-administered medications for this visit.    Allergies:   Adhesive; Latex; and Penicillins    Social History:  The patient  reports that she quit smoking about 22 years ago. Her smoking use included Cigarettes. She has a 10 pack-year smoking history. She has never used smokeless tobacco. She reports that she drinks about 3.0 oz of alcohol per week. She reports that she does not use illicit drugs.   Family History:  The patient's family history includes Asthma in her mother; Congestive Heart Failure in her father; Multiple sclerosis in her father.    ROS:  Please see the history of present illness.   Otherwise, review of systems are positive for episode of facial numbness as noted above.   All other systems are reviewed and negative.    PHYSICAL EXAM: VS:  BP 136/80 mmHg  Pulse 66  Ht 5\' 4"  (1.626 m)  Wt 168 lb (76.204 kg)  BMI 28.82 kg/m2  LMP 10/21/1992 , BMI Body mass index is 28.82 kg/(m^2). GEN: Well nourished, well developed, in no acute distress HEENT: normal Neck: no JVD, carotid bruits, or masses Cardiac: RRR; 1/6 diastolic murmur, no rubs, or gallops,no edema  Respiratory:  clear to auscultation bilaterally, normal work of breathing GI: soft, nontender, nondistended, + BS MS: no deformity or atrophy Skin: warm and dry, no rash Neuro:  Strength and sensation are intact Psych: euthymic mood, full affect   EKG:   The ekg ordered today demonstrates NSR, nonspecific ST segment changes, no change from prior   Recent Labs: 05/08/2015: ALT 16; BUN 15; Creat 0.75; Hemoglobin 12.7; Platelets 250; Potassium 3.9; Sodium 141; TSH 1.687   Lipid Panel    Component Value Date/Time   CHOL 177  05/08/2015 1708   TRIG 154* 05/08/2015 1708   HDL 61 05/08/2015 1708   CHOLHDL 2.9 05/08/2015 1708   VLDL 31 05/08/2015 1708   LDLCALC 85 05/08/2015 1708     Other studies Reviewed: Additional studies/ records that were reviewed today with results demonstrating: 2015 echo shows normal EF with mild AI.   ASSESSMENT AND PLAN:  1. Aortic insufficiency: No sx of CHF at this time. Echo from 2015 was reviewed. . LV function appeared normal. No increase in her AI in 2009. COntinue BP control and risk factor modification.  2. HTN: COntinue to follow at home.  COntinue losartan.  I am not convinced that what she had with lisinopril was truly angioedema. She seems to be tolerating her losartan. Will check electrolytes today given the change in medication.  Agree with increase in the losartan 50 mg. Her pressures have been slightly higher. 3. Slightly elevated triglycerides of July 2016. She has cut back on exercise. Hopefully with exercise and better diet control, her lipids will improve.  If they stay elevated, could consider fish oil.     Current medicines are reviewed at length with the patient today.  The patient concerns regarding her medicines were addressed.  The following changes have been made:  No change  Labs/ tests ordered today include:   Orders Placed This Encounter  Procedures  . Basic Metabolic Panel (BMET)  . EKG 12-Lead    Recommend 150 minutes/week of aerobic exercise Low fat, low carb, high fiber diet recommended  Disposition:   FU in 1 year   Teresita Madura., MD  08/24/2015 8:28 AM    [facility] [address], [place], [place]  [address] Phone: [phone]; Fax: [phone]

## 2015-09-22 ENCOUNTER — Other Ambulatory Visit (HOSPITAL_COMMUNITY): Payer: Self-pay | Admitting: Orthopedic Surgery

## 2015-09-22 DIAGNOSIS — M25562 Pain in left knee: Secondary | ICD-10-CM

## 2015-10-04 ENCOUNTER — Ambulatory Visit (HOSPITAL_COMMUNITY): Payer: 59

## 2015-10-05 ENCOUNTER — Ambulatory Visit (HOSPITAL_COMMUNITY)
Admission: RE | Admit: 2015-10-05 | Discharge: 2015-10-05 | Disposition: A | Discharge status: Home / Self Care | Payer: 59 | Source: Ambulatory Visit | Attending: Orthopedic Surgery | Admitting: Orthopedic Surgery

## 2015-10-05 DIAGNOSIS — M660 Rupture of popliteal cyst: Secondary | ICD-10-CM | POA: Diagnosis not present

## 2015-10-05 DIAGNOSIS — M25562 Pain in left knee: Secondary | ICD-10-CM | POA: Diagnosis present

## 2015-10-05 DIAGNOSIS — M25462 Effusion, left knee: Secondary | ICD-10-CM | POA: Diagnosis not present

## 2015-10-05 DIAGNOSIS — S83242A Other tear of medial meniscus, current injury, left knee, initial encounter: Secondary | ICD-10-CM | POA: Diagnosis not present

## 2015-10-05 DIAGNOSIS — X58XXXA Exposure to other specified factors, initial encounter: Secondary | ICD-10-CM | POA: Insufficient documentation

## 2015-10-06 ENCOUNTER — Ambulatory Visit (HOSPITAL_COMMUNITY): Admission: RE | Admit: 2015-10-06 | Payer: 59 | Source: Ambulatory Visit | Admitting: Orthopedic Surgery

## 2015-10-30 ENCOUNTER — Other Ambulatory Visit: Payer: Self-pay

## 2015-10-30 DIAGNOSIS — Z1231 Encounter for screening mammogram for malignant neoplasm of breast: Secondary | ICD-10-CM

## 2015-11-01 ENCOUNTER — Encounter (HOSPITAL_BASED_OUTPATIENT_CLINIC_OR_DEPARTMENT_OTHER): Payer: Self-pay | Admitting: *Deleted

## 2015-11-01 DIAGNOSIS — M1712 Unilateral primary osteoarthritis, left knee: Secondary | ICD-10-CM | POA: Diagnosis not present

## 2015-11-01 DIAGNOSIS — A6009 Herpesviral infection of other urogenital tract: Secondary | ICD-10-CM | POA: Diagnosis present

## 2015-11-01 MED FILL — PREMPRO 0.3 MG-1.5 MG TAB: 0.3-1.5 | 56 days supply | Qty: 56 | Fill #2

## 2015-11-01 MED FILL — MUPIROCIN 2% OINTMENT: 2 | 90 days supply | Qty: 44 | Fill #0

## 2015-11-01 NOTE — H&P (Signed)
TOTAL KNEE ADMISSION H&P  Patient is being admitted for left total knee arthroplasty.  Subjective:  Chief Complaint:left knee pain.  HPI: Rebecca Terry, 65 y.o. female, has a history of pain and functional disability in the left knee due to arthritis and has failed non-surgical conservative treatments for greater than 12 weeks to includeNSAID's and/or analgesics, corticosteriod injections, viscosupplementation injections, flexibility and strengthening excercises, supervised PT with diminished ADL's post treatment, weight reduction as appropriate and activity modification.  Onset of symptoms was gradual, starting >10 years ago with gradually worsening course since that time. The patient noted prior procedures on the knee to include  arthroscopy and menisectomy on the left knee(s).  Patient currently rates pain in the left knee(s) at 10 out of 10 with activity. Patient has night pain, worsening of pain with activity and weight bearing, pain that interferes with activities of daily living, crepitus and joint swelling.  Patient has evidence of subchondral sclerosis, periarticular osteophytes and joint space narrowing by imaging studies.  There is no active infection.  Patient Active Problem List   Diagnosis Date Noted  . Herpes genitalis in women   . Hypertriglyceridemia 08/24/2015  . History of MRSA infection 07/22/2015  . Aortic valve disorder   . Essential hypertension, benign   . 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  . Osteoarthritis of neck 10/28/2013  . Pain in joint, shoulder region 10/13/2013  . Hallux rigidus of right foot 10/13/2013  . Hammertoe 10/13/2013  . LATERAL EPICONDYLITIS, RIGHT 10/17/2008  . ARM PAIN, RIGHT 10/17/2008   Past Medical History  Diagnosis Date  . Fibroid     posterior  . Hypertension   . Urinary incontinence   . Aortic valve disorders   . Essential hypertension, benign   . Herpes genitalis in women   . Rectocele   .  Gilbert's syndrome   . Diverticulosis   . Abnormal Pap smear of cervix     09/2013 Ascus:Pos HR HPV, 03/2014 Ascus:Pos HR HPV;colpo 05/2014 LGSIL of cx and VIN 1of perianal region  . Arthritis   . History of MRSA infection 07/2015    culture positive [facility]  . Primary localized osteoarthritis of left knee     Past Surgical History  Procedure Laterality Date  . Bilateral oophorectomy  2010    Dr. Quincy Simmonds  . Pelvic laparoscopy      x2--ectopics  . Carpal tunnel release Bilateral   . Arthroscopic knee Left 2001  . Diagnostic laparoscopy      No current facility-administered medications for this encounter.  Current outpatient prescriptions:  .  clobetasol cream (TEMOVATE) [id number] %, Apply 1 application topically daily as needed. , Disp: , Rfl: 1 .  estrogen, conjugated,-medroxyprogesterone (PREMPRO) 0.3-1.5 MG per tablet, Take 1 tablet by mouth daily., Disp: 90 tablet, Rfl: 1 .  losartan (COZAAR) 50 MG tablet, Take 50 mg by mouth daily. , Disp: , Rfl: 5 .  valACYclovir (VALTREX) 500 MG tablet, Take 500 mg by mouth daily as needed. , Disp: , Rfl: 3    Allergies  Allergen Reactions  . Adhesive [Tape] Rash    blisters  . Latex Rash  . Penicillins Rash    Social History  Substance Use Topics  . Smoking status: Former Smoker -- 0.50 packs/day for 20 years    Types: Cigarettes    Quit date: 04/07/1993  . Smokeless tobacco: Never Used  . Alcohol Use: 3.0 oz/week    5 Standard drinks or equivalent per week  Comment: 5 glasses wine/beer per week    Family History  Problem Relation Age of Onset  . Asthma Mother   . Congestive Heart Failure Father   . Multiple sclerosis Father      Review of Systems  Constitutional: Negative.   HENT: Negative.   Eyes: Negative.   Respiratory: Negative.   Cardiovascular: Negative.   Gastrointestinal: Negative.   Genitourinary: Negative.   Musculoskeletal: Positive for joint pain.       Left knee pain  Skin: Negative.   Neurological:  Negative.   Psychiatric/Behavioral: Negative.     Objective:  Physical Exam  Constitutional: She is oriented to person, place, and time. She appears well-developed and well-nourished.  HENT:  Head: Normocephalic and atraumatic.  Mouth/Throat: Oropharynx is clear and moist.  Eyes: Conjunctivae are normal. Pupils are equal, round, and reactive to light.  Neck: Normal range of motion. Neck supple.  Cardiovascular: Normal rate, regular rhythm and normal heart sounds.   Respiratory: Effort normal and breath sounds normal.  GI: Soft. Bowel sounds are normal.  Genitourinary:  Not pertinent to current symptomatology therefore not examined.  Musculoskeletal:  Examination of her left knee reveals pain medially, 1+ synovitis.  Range of motion is 0 to 120 degrees.  The knee is stable with normal  patellar tracking.  Examination of the right knee reveals full range of motion without pain, swelling, weakness or instability.  Vascular examination: pulses are 2+ and symmetric  Neurological: She is alert and oriented to person, place, and time.  Skin: Skin is warm and dry.  Psychiatric: She has a normal mood and affect. Her behavior is normal.    Vital signs in last 24 hours: Weight:  [74.844 kg (165 lb)] 74.844 kg (165 lb) (01/11 1013)  Labs:   Estimated body mass index is 28.31 kg/(m^2) as calculated from the following:   Height as of this encounter: 5\' 4"  (1.626 m).   Weight as of this encounter: 74.844 kg (165 lb).   Imaging Review Plain radiographs demonstrate severe degenerative joint disease of the left knee(s). The overall alignment issignificant varus. The bone quality appears to be good for age and reported activity level.  Assessment/Plan:  End stage arthritis, left knee Active Problems:   Aortic valve disorder   Essential hypertension, benign   History of MRSA infection   Herpes genitalis in women    The patient history, physical examination, clinical judgment of the  provider and imaging studies are consistent with end stage degenerative joint disease of the left knee(s) and total knee arthroplasty is deemed medically necessary. The treatment options including medical management, injection therapy arthroscopy and arthroplasty were discussed at length. The risks and benefits of total knee arthroplasty were presented and reviewed. The risks due to aseptic loosening, infection, stiffness, patella tracking problems, thromboembolic complications and other imponderables were discussed. The patient acknowledged the explanation, agreed to proceed with the plan and consent was signed. Patient is being admitted for inpatient treatment for surgery, pain control, PT, OT, prophylactic antibiotics, VTE prophylaxis, progressive ambulation and ADL's and discharge planning. The patient is planning to be discharged home with home health services  Vancomycin due to history of MRSA infection in her right great toe 3 months ago

## 2015-11-03 ENCOUNTER — Ambulatory Visit
Admission: RE | Admit: 2015-11-03 | Discharge: 2015-11-03 | Disposition: A | Discharge status: Home / Self Care | Payer: 59 | Source: Ambulatory Visit

## 2015-11-03 ENCOUNTER — Encounter (HOSPITAL_BASED_OUTPATIENT_CLINIC_OR_DEPARTMENT_OTHER)
Admission: RE | Admit: 2015-11-03 | Discharge: 2015-11-03 | Disposition: A | Discharge status: Home / Self Care | Payer: 59 | Source: Ambulatory Visit | Attending: Orthopedic Surgery | Admitting: Orthopedic Surgery

## 2015-11-03 DIAGNOSIS — Z1231 Encounter for screening mammogram for malignant neoplasm of breast: Secondary | ICD-10-CM | POA: Diagnosis not present

## 2015-11-03 DIAGNOSIS — Z01812 Encounter for preprocedural laboratory examination: Secondary | ICD-10-CM | POA: Diagnosis not present

## 2015-11-03 DIAGNOSIS — M1712 Unilateral primary osteoarthritis, left knee: Secondary | ICD-10-CM | POA: Insufficient documentation

## 2015-11-03 LAB — COMPREHENSIVE METABOLIC PANEL
ALBUMIN: 3.6 g/dL (ref 3.5–5.0)
ALT: 24 U/L (ref 14–54)
AST: 23 U/L (ref 15–41)
Alkaline Phosphatase: 50 U/L (ref 38–126)
Anion gap: 9 (ref 5–15)
BUN: 16 mg/dL (ref 6–20)
CHLORIDE: 108 mmol/L (ref 101–111)
CO2: 25 mmol/L (ref 22–32)
CREATININE: 0.74 mg/dL (ref 0.44–1.00)
Calcium: 9.5 mg/dL (ref 8.9–10.3)
GFR calc non Af Amer: >60 mL/min (ref >60)
Glucose, Bld: 102 mg/dL — ABNORMAL HIGH (ref 65–99)
Potassium: 4.2 mmol/L (ref 3.5–5.1)
SODIUM: 142 mmol/L (ref 135–145)
Total Bilirubin: 0.6 mg/dL (ref 0.3–1.2)
Total Protein: 6.4 g/dL — ABNORMAL LOW (ref 6.5–8.1)

## 2015-11-03 LAB — CBC WITH DIFFERENTIAL/PLATELET
BASOS PCT: 1 %
Basophils Absolute: 0.1 10*3/uL (ref 0.0–0.1)
EOS ABS: 0.2 10*3/uL (ref 0.0–0.7)
Eosinophils Relative: 3 %
HEMATOCRIT: 39.5 % (ref 36.0–46.0)
HEMOGLOBIN: 13 g/dL (ref 12.0–15.0)
Lymphocytes Relative: 35 %
Lymphs Abs: 2.1 10*3/uL (ref 0.7–4.0)
MCH: 31.4 pg (ref 26.0–34.0)
MCHC: 32.9 g/dL (ref 30.0–36.0)
MCV: 95.4 fL (ref 78.0–100.0)
MONOS PCT: 8 %
Monocytes Absolute: 0.5 10*3/uL (ref 0.1–1.0)
NEUTROS ABS: 3.1 10*3/uL (ref 1.7–7.7)
NEUTROS PCT: 53 %
PLATELETS: 251 10*3/uL (ref 150–400)
RBC: 4.14 MIL/uL (ref 3.87–5.11)
RDW: 13 % (ref 11.5–15.5)
WBC: 5.9 10*3/uL (ref 4.0–10.5)

## 2015-11-03 LAB — SURGICAL PCR SCREEN
MRSA, PCR: POSITIVE — AB
STAPHYLOCOCCUS AUREUS: POSITIVE — AB

## 2015-11-03 MED FILL — CLOBETASOL 0.05% CREAM: 0.05 | 20 days supply | Qty: 30 | Fill #1

## 2015-11-05 LAB — URINE CULTURE

## 2015-11-07 DIAGNOSIS — L259 Unspecified contact dermatitis, unspecified cause: Secondary | ICD-10-CM | POA: Diagnosis not present

## 2015-11-27 ENCOUNTER — Encounter (HOSPITAL_COMMUNITY): Payer: Self-pay | Admitting: Physician Assistant

## 2015-11-27 DIAGNOSIS — M1712 Unilateral primary osteoarthritis, left knee: Secondary | ICD-10-CM | POA: Diagnosis present

## 2015-11-27 NOTE — H&P (Signed)
TOTAL KNEE ADMISSION H&P  Patient is being admitted for left total knee arthroplasty.  Subjective:  Chief Complaint:left knee pain.  HPI: Rebecca Terry, 65 y.o. female, has a history of pain and functional disability in the left knee due to arthritis and has failed non-surgical conservative treatments for greater than 12 weeks to includeNSAID's and/or analgesics, corticosteriod injections, viscosupplementation injections, flexibility and strengthening excercises, supervised PT with diminished ADL's post treatment, weight reduction as appropriate and activity modification.  Onset of symptoms was gradual, starting 10 years ago with gradually worsening course since that time. The patient noted prior procedures on the knee to include  arthroscopy and menisectomy on the left knee(s).  Patient currently rates pain in the left knee(s) at 10 out of 10 with activity. Patient has night pain, worsening of pain with activity and weight bearing, pain that interferes with activities of daily living, crepitus and joint swelling.  Patient has evidence of subchondral sclerosis, periarticular osteophytes and joint space narrowing by imaging studies.  There is no active infection.  Patient Active Problem List   Diagnosis Date Noted  . Primary localized osteoarthritis of left knee 11/27/2015  . Gilbert's syndrome   . Herpes genitalis in women   . Hypertriglyceridemia 08/24/2015  . History of MRSA infection 07/22/2015  . Aortic valve disorder   . Essential hypertension, benign   . 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  . Osteoarthritis of neck 10/28/2013  . Pain in joint, shoulder region 10/13/2013  . Hallux rigidus of right foot 10/13/2013  . Hammertoe 10/13/2013  . LATERAL EPICONDYLITIS, RIGHT 10/17/2008  . ARM PAIN, RIGHT 10/17/2008   Past Medical History  Diagnosis Date  . Fibroid     posterior  . Hypertension   . Urinary incontinence   . Aortic valve disorders   .  Essential hypertension, benign   . Herpes genitalis in women   . Rectocele   . Gilbert's syndrome   . Diverticulosis   . Abnormal Pap smear of cervix     09/2013 Ascus:Pos HR HPV, 03/2014 Ascus:Pos HR HPV;colpo 05/2014 LGSIL of cx and VIN 1of perianal region  . Arthritis   . History of MRSA infection 07/2015    culture positive [facility]  . Primary localized osteoarthritis of left knee     Past Surgical History  Procedure Laterality Date  . Bilateral oophorectomy  2010    Dr. Quincy Simmonds  . Pelvic laparoscopy      x2--ectopics  . Carpal tunnel release Bilateral   . Arthroscopic knee Left 2001  . Diagnostic laparoscopy      No current facility-administered medications for this encounter.  Current outpatient prescriptions:  .  clobetasol cream (TEMOVATE) [id number] %, Apply 1 application topically daily as needed. , Disp: , Rfl: 1 .  estrogen, conjugated,-medroxyprogesterone (PREMPRO) 0.3-1.5 MG per tablet, Take 1 tablet by mouth daily., Disp: 90 tablet, Rfl: 1 .  losartan (COZAAR) 50 MG tablet, Take 50 mg by mouth daily. , Disp: , Rfl: 5 .  valACYclovir (VALTREX) 500 MG tablet, Take 500 mg by mouth daily as needed. , Disp: , Rfl: 3 Allergies  Allergen Reactions  . Nickel   . Adhesive [Tape] Rash    blisters  . Latex Rash  . Penicillins Rash    Social History  Substance Use Topics  . Smoking status: Former Smoker -- 0.50 packs/day for 20 years    Types: Cigarettes    Quit date: 04/07/1993  . Smokeless tobacco: Never Used  .  Alcohol Use: 3.0 oz/week    5 Standard drinks or equivalent per week     Comment: 5 glasses wine/beer per week    Family History  Problem Relation Age of Onset  . Asthma Mother   . Congestive Heart Failure Father   . Multiple sclerosis Father      Review of Systems  Constitutional: Negative.   HENT: Negative.   Eyes: Negative.   Respiratory: Negative.   Cardiovascular: Negative.   Gastrointestinal: Negative.   Genitourinary: Negative.    Musculoskeletal:       Left knee pain  Skin: Negative.   Neurological: Negative.   Endo/Heme/Allergies: Negative.   Psychiatric/Behavioral: Negative.     Objective:  Physical Exam  Constitutional: She is oriented to person, place, and time. She appears well-developed and well-nourished.  HENT:  Head: Normocephalic and atraumatic.  Mouth/Throat: Oropharynx is clear and moist.  Eyes: Conjunctivae and EOM are normal. Pupils are equal, round, and reactive to light.  Neck: Neck supple.  Cardiovascular: Normal rate and regular rhythm.   Respiratory: Effort normal and breath sounds normal.  GI: Soft. Bowel sounds are normal.  Genitourinary:  Not pertinent to current symptomatology therefore not examined.  Musculoskeletal:  Examination of her left knee reveals pain medially, 1+ synovitis.  Range of motion is 0 to 120 degrees.  The knee is stable with normal  patellar tracking.  Examination of the right knee reveals full range of motion without pain, swelling, weakness or instability.  Vascular examination: pulses are 2+ and symmetric  Neurological: She is alert and oriented to person, place, and time.  Skin: Skin is warm and dry.  Psychiatric: She has a normal mood and affect. Her behavior is normal.    Vital signs in last 24 hours:    Labs:   Estimated body mass index is 28.31 kg/(m^2) as calculated from the following:   Height as of this encounter: 5\' 4"  (1.626 m).   Weight as of this encounter: 74.844 kg (165 lb).   Imaging Review Plain radiographs demonstrate severe degenerative joint disease of the left knee(s). The overall alignment issignificant varus. The bone quality appears to be good for age and reported activity level.  Assessment/Plan:  End stage arthritis, left knee  Principal Problem:   Primary localized osteoarthritis of left knee Active Problems:   Aortic valve disorder   Essential hypertension, benign   History of MRSA infection   Herpes genitalis in  women   Gilbert's syndrome   The patient history, physical examination, clinical judgment of the provider and imaging studies are consistent with end stage degenerative joint disease of the left knee(s) and total knee arthroplasty is deemed medically necessary. The treatment options including medical management, injection therapy arthroscopy and arthroplasty were discussed at length. The risks and benefits of total knee arthroplasty were presented and reviewed. The risks due to aseptic loosening, infection, stiffness, patella tracking problems, thromboembolic complications and other imponderables were discussed. The patient acknowledged the explanation, agreed to proceed with the plan and consent was signed. Patient is being admitted for inpatient treatment for surgery, pain control, PT, OT, prophylactic antibiotics, VTE prophylaxis, progressive ambulation and ADL's and discharge planning. The patient is planning to be discharged home with home health services

## 2015-11-29 ENCOUNTER — Encounter (HOSPITAL_COMMUNITY)
Admission: RE | Admit: 2015-11-29 | Discharge: 2015-11-29 | Disposition: A | Discharge status: Home / Self Care | Payer: 59 | Source: Ambulatory Visit | Attending: Orthopedic Surgery | Admitting: Orthopedic Surgery

## 2015-11-29 ENCOUNTER — Encounter (HOSPITAL_COMMUNITY): Payer: Self-pay

## 2015-11-29 DIAGNOSIS — Z0183 Encounter for blood typing: Secondary | ICD-10-CM | POA: Diagnosis not present

## 2015-11-29 DIAGNOSIS — I351 Nonrheumatic aortic (valve) insufficiency: Secondary | ICD-10-CM | POA: Diagnosis not present

## 2015-11-29 DIAGNOSIS — Z87891 Personal history of nicotine dependence: Secondary | ICD-10-CM | POA: Insufficient documentation

## 2015-11-29 DIAGNOSIS — Z79899 Other long term (current) drug therapy: Secondary | ICD-10-CM | POA: Diagnosis not present

## 2015-11-29 DIAGNOSIS — Z01812 Encounter for preprocedural laboratory examination: Secondary | ICD-10-CM | POA: Diagnosis not present

## 2015-11-29 DIAGNOSIS — M1712 Unilateral primary osteoarthritis, left knee: Secondary | ICD-10-CM | POA: Insufficient documentation

## 2015-11-29 DIAGNOSIS — Z01818 Encounter for other preprocedural examination: Secondary | ICD-10-CM | POA: Diagnosis not present

## 2015-11-29 DIAGNOSIS — I10 Essential (primary) hypertension: Secondary | ICD-10-CM | POA: Insufficient documentation

## 2015-11-29 LAB — COMPREHENSIVE METABOLIC PANEL
ALBUMIN: 3.7 g/dL (ref 3.5–5.0)
ALT: 17 U/L (ref 14–54)
ANION GAP: 11 (ref 5–15)
AST: 19 U/L (ref 15–41)
Alkaline Phosphatase: 52 U/L (ref 38–126)
BUN: 16 mg/dL (ref 6–20)
CALCIUM: 9.5 mg/dL (ref 8.9–10.3)
CO2: 25 mmol/L (ref 22–32)
Chloride: 106 mmol/L (ref 101–111)
Creatinine, Ser: 0.78 mg/dL (ref 0.44–1.00)
GFR calc non Af Amer: >60 mL/min (ref >60)
GLUCOSE: 101 mg/dL — AB (ref 65–99)
POTASSIUM: 4.1 mmol/L (ref 3.5–5.1)
SODIUM: 142 mmol/L (ref 135–145)
TOTAL PROTEIN: 6.5 g/dL (ref 6.5–8.1)
Total Bilirubin: 0.8 mg/dL (ref 0.3–1.2)

## 2015-11-29 LAB — CBC WITH DIFFERENTIAL/PLATELET
BASOS PCT: 1 %
Basophils Absolute: 0.1 10*3/uL (ref 0.0–0.1)
EOS ABS: 0.2 10*3/uL (ref 0.0–0.7)
EOS PCT: 3 %
HCT: 39.3 % (ref 36.0–46.0)
Hemoglobin: 12.9 g/dL (ref 12.0–15.0)
LYMPHS ABS: 2.9 10*3/uL (ref 0.7–4.0)
Lymphocytes Relative: 42 %
MCH: 30.7 pg (ref 26.0–34.0)
MCHC: 32.8 g/dL (ref 30.0–36.0)
MCV: 93.6 fL (ref 78.0–100.0)
MONO ABS: 0.5 10*3/uL (ref 0.1–1.0)
MONOS PCT: 7 %
Neutro Abs: 3.3 10*3/uL (ref 1.7–7.7)
Neutrophils Relative %: 47 %
Platelets: 275 10*3/uL (ref 150–400)
RBC: 4.2 MIL/uL (ref 3.87–5.11)
RDW: 13.1 % (ref 11.5–15.5)
WBC: 6.9 10*3/uL (ref 4.0–10.5)

## 2015-11-29 LAB — PROTIME-INR
INR: 0.9 (ref 0.00–1.49)
Prothrombin Time: 12.3 seconds (ref 11.6–15.2)

## 2015-11-29 LAB — TYPE AND SCREEN
ABO/RH(D): A POS
ANTIBODY SCREEN: NEGATIVE

## 2015-11-29 LAB — ABO/RH: ABO/RH(D): A POS

## 2015-11-29 LAB — SURGICAL PCR SCREEN
MRSA, PCR: NEGATIVE
Staphylococcus aureus: NEGATIVE

## 2015-11-29 LAB — APTT: aPTT: 27 seconds (ref 24–37)

## 2015-11-29 NOTE — Pre-Procedure Instructions (Signed)
Rebecca Terry  11/29/2015      [facility] - [place], [place] - [address] [place] [address] Phone: [phone] Fax: [place], [place] [address] Phone: [phone] Fax: [phone]    Your procedure is scheduled on Monday February 20th 2017 at 42 am.  Report to [facility] [place] at 530 A.M.  Call this number if you have problems the morning of surgery:  [phone]   Remember:  Do not eat food or drink liquids after midnight.  Take these medicines the morning of surgery with A SIP OF WATER valacyclovir (if needed)  STOP: ALL Vitamins, Supplements, Herbal Medications, Fish Oils, Aspirins, NSAIDs (Nonsteroidal Anti-inflammatories such as Ibuprofen, Aleve, or Advil), and Goody's/BC Powders 7 days prior to surgery, until after surgery as directed by your physician.    Do not wear jewelry, make-up or nail polish.  Do not wear lotions, powders, or perfumes.  You may wear deodorant.  Do not shave 48 hours prior to surgery.  Men may shave face and neck.  Do not bring valuables to the hospital.  [facility] is not responsible for any belongings or valuables.  Contacts, dentures or bridgework may not be worn into surgery.  Leave your suitcase in the car.  After surgery it may be brought to your room.  For patients admitted to the hospital, discharge time will be determined by your treatment team.  Patients discharged the day of surgery will not be allowed to drive home.   Please read over the following fact sheets that you were given. Pain Booklet, Coughing and Deep Breathing, Blood Transfusion Information, Total Joint Packet, MRSA Information and Surgical Site Infection Prevention        Preparing for Surgery at [facility]  Before surgery, you can play an important role.  Because skin is not sterile,  your skin needs to be as free of germs as possible.  You can reduce the number of germs on your skin by washing with CHG (chlorahexidine gluconate) Soap before surgery.  CHG is an antiseptic cleaner with kills germs and bonds with the skin to continue killing germs even after washing.   Please do not use if you have an allergy to CHG or antibacterial soaps.  If your skin becomes reddened/irritated stop using the CHG.  Do not shave (including legs and underarms) for at least 48 hours prior to first CHG shower.  It is okay to shave your face.  Please follow these instructions carefully:  1. Shower with CHG Soap the night before surgery and the morning of Surgery. 2. If you choose to wash your hair, wash your hair first as usual with your normal shampoo. 3. After you shampoo, rinse your hair and body thoroughly to remove the Shampoo. 4. Use CHG as you would any other liquid soap. You can apply chg directly to the skin and wash gently with scrungie or a clean washcloth. 5. Apply the CHG Soap to your body ONLY FROM THE NECK DOWN. Do not use on open wounds or open sores. Avoid contact with your eyes, ears, mouth and genitals (private parts). Wash genitals (private parts) with your normal soap. 6. Wash thoroughly, paying special attention to the area where your surgery will be performed. 7. Thoroughly rinse your body with warm water from the neck down. 8. DO NOT shower/wash with your normal  soap after using and rinsing off the CHG Soap. 9. Pat yourself dry with a clean towel.  10. Wear clean pajamas.  11. Place clean sheets on your bed the night of your first shower and do not sleep with pets.  Day of Surgery  Do not apply any lotions/deodorants the morning of surgery. Please wear clean clothes to the hospital/surgery center.

## 2015-11-29 NOTE — Progress Notes (Signed)
Patient is an Therapist, sports at [employer].   Patient's pcp is Reginia Naas, MD  She sees Dr. Irish Lack for Aortic Insufficiency.  She states he gave clearance for surgery prior when she was originally scheduled for outpatient surgery and initially was going to have a partial knee replacement.    Patient denies having a cardiac cath.  Stress test in 2007, ECHO in 2009 and 2015, all in [facility].  EKG 08/24/2015 at Varanasi's office also in [facility].

## 2015-11-30 LAB — URINE CULTURE

## 2015-12-01 NOTE — Progress Notes (Signed)
Anesthesia Chart Review:  Pt is a 65 year old female scheduled for L total knee arthroplasty on 12/11/2015 with Dr. Noemi Chapel.   Cardiologist is Dr. Irish Lack, last office visit 08/24/15.   PMH includes:  HTN, mild aortic insufficiency. Former smoker. BMI 30.   Medications include: losartan  EKG 08/24/15: NSR. Nonspecific T wave abnormality  Echo 06/22/14:  - Left ventricle: The cavity size was normal. Wall thickness was normal. Systolic function was normal. The estimated ejection fraction was in the range of 55% to 60%. Wall motion was normal; there were no regional wall motion abnormalities. Doppler parameters are consistent with abnormal left ventricular relaxation (grade 1 diastolic dysfunction). The E/e&' ratio is between 8-15, suggesting indeterminate LV filling pressure. - Aortic valve: Structurally normal valve. Trileaflet. There was mild regurgitation. - Mitral valve: Mildly thickened leaflets . There was mildregurgitation. - Left atrium: The atrium was normal in size. - Impressions: Compared to the prior echo in 2012, there is persistent mild AIand mild MR. The LVEF has improved to 55-60%.  If no changes, I anticipate pt can proceed with surgery as scheduled.   Willeen Cass, FNP-BC [facility] Stay Surgical Center/Anesthesiology Phone: [phone] 12/01/2015 11:45 AM

## 2015-12-04 MED FILL — VALACYCLOVIR HCL 500 MG TAB: 500 | 30 days supply | Qty: 30 | Fill #2

## 2015-12-05 MED FILL — LOSARTAN POTASSIUM 50 MG TA: 50 | 60 days supply | Qty: 60 | Fill #2

## 2015-12-08 DIAGNOSIS — Z96652 Presence of left artificial knee joint: Secondary | ICD-10-CM | POA: Diagnosis not present

## 2015-12-08 DIAGNOSIS — M1712 Unilateral primary osteoarthritis, left knee: Secondary | ICD-10-CM | POA: Diagnosis not present

## 2015-12-08 MED ORDER — LACTATED RINGERS IV SOLN
INTRAVENOUS | Status: DC
  Administered 2015-12-11 (×3): via INTRAVENOUS

## 2015-12-08 MED ORDER — MIDAZOLAM HCL 2 MG/2ML IJ SOLN
1.0000 mg | INTRAMUSCULAR | Status: DC | PRN
  Administered 2015-12-11: 2 mg via INTRAVENOUS

## 2015-12-08 MED ORDER — VANCOMYCIN HCL IN DEXTROSE 1-5 GM/200ML-% IV SOLN
1000.0000 mg | INTRAVENOUS | Status: AC
  Administered 2015-12-11: 1000 mg via INTRAVENOUS
  Filled 2015-12-08: qty 200

## 2015-12-08 MED ORDER — POVIDONE-IODINE 7.5 % EX SOLN
Freq: Once | CUTANEOUS | Status: DC
  Filled 2015-12-08: qty 118

## 2015-12-08 MED ORDER — SCOPOLAMINE 1 MG/3DAYS TD PT72
1.0000 | MEDICATED_PATCH | Freq: Once | TRANSDERMAL | Status: DC
  Administered 2015-12-11: 1.5 mg via TRANSDERMAL
  Filled 2015-12-08: qty 1

## 2015-12-08 MED ORDER — GLYCOPYRROLATE 0.2 MG/ML IJ SOLN: 0.2000 mg | Freq: Once | INTRAMUSCULAR | Status: DC | PRN

## 2015-12-08 MED ORDER — FENTANYL CITRATE (PF) 100 MCG/2ML IJ SOLN
50.0000 ug | INTRAMUSCULAR | Status: DC | PRN
  Administered 2015-12-11: 50 ug via INTRAVENOUS

## 2015-12-10 NOTE — Anesthesia Preprocedure Evaluation (Addendum)
Anesthesia Evaluation  Patient identified by MRN, date of birth, ID band Patient awake    Reviewed: Allergy & Precautions, NPO status , Patient's Chart, lab work & pertinent test results  Airway Mallampati: II  TM Distance: >3 FB Neck ROM: Full    Dental  (+) Teeth Intact   Pulmonary former smoker,    breath sounds clear to auscultation       Cardiovascular hypertension, Pt. on medications + Valvular Problems/Murmurs AI  Rhythm:Regular Rate:Normal     Neuro/Psych negative neurological ROS  negative psych ROS   GI/Hepatic negative GI ROS, Neg liver ROS,   Endo/Other  negative endocrine ROS  Renal/GU negative Renal ROS  negative genitourinary   Musculoskeletal  (+) Arthritis ,   Abdominal   Peds negative pediatric ROS (+)  Hematology negative hematology ROS (+)   Anesthesia Other Findings   Reproductive/Obstetrics negative OB ROS                          Lab Results  Component Value Date   WBC 6.9 11/29/2015   HGB 12.9 11/29/2015   HCT 39.3 11/29/2015   MCV 93.6 11/29/2015   PLT 275 11/29/2015  ' Lab Results  Component Value Date   INR 0.90 11/29/2015   08/2015 EKG: NSR  2015 Echo - Left ventricle: The cavity size was normal. Wall thickness was normal. Systolic function was normal. The estimated ejection fraction was in the range of 55% to 60%. Wall motion was normal; there were no regional wall motion abnormalities. Doppler parameters are consistent with abnormal left ventricular relaxation (grade 1 diastolic dysfunction). The E/e&' ratio is between 8-15, suggesting indeterminate LV filling pressure. - Aortic valve: Structurally normal valve. Trileaflet. There was mild regurgitation. - Mitral valve: Mildly thickened leaflets . There was mild regurgitation. - Left atrium: The atrium was normal in size.  Anesthesia Physical Anesthesia Plan  ASA:  II  Anesthesia Plan: Spinal   Post-op Pain Management:    Induction: Intravenous  Airway Management Planned: Natural Airway and Simple Face Mask  Additional Equipment:   Intra-op Plan:   Post-operative Plan:   Informed Consent: I have reviewed the patients History and Physical, chart, labs and discussed the procedure including the risks, benefits and alternatives for the proposed anesthesia with the patient or authorized representative who has indicated his/her understanding and acceptance.     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

## 2015-12-11 ENCOUNTER — Ambulatory Visit (HOSPITAL_COMMUNITY): Payer: 59 | Admitting: Emergency Medicine

## 2015-12-11 ENCOUNTER — Inpatient Hospital Stay (HOSPITAL_COMMUNITY)
Admission: AD | Admit: 2015-12-11 | Discharge: 2015-12-12 | DRG: 470 | Disposition: A | Discharge status: Home Health | Payer: 59 | Source: Ambulatory Visit | Attending: Orthopedic Surgery | Admitting: Orthopedic Surgery

## 2015-12-11 ENCOUNTER — Encounter (HOSPITAL_COMMUNITY)
Admission: AD | Disposition: A | Discharge status: Home Health | Payer: Self-pay | Source: Ambulatory Visit | Attending: Orthopedic Surgery

## 2015-12-11 ENCOUNTER — Encounter (HOSPITAL_COMMUNITY): Payer: Self-pay | Admitting: *Deleted

## 2015-12-11 ENCOUNTER — Ambulatory Visit (HOSPITAL_COMMUNITY): Payer: 59 | Admitting: Anesthesiology

## 2015-12-11 DIAGNOSIS — Z91048 Other nonmedicinal substance allergy status: Secondary | ICD-10-CM | POA: Diagnosis not present

## 2015-12-11 DIAGNOSIS — Z8614 Personal history of Methicillin resistant Staphylococcus aureus infection: Secondary | ICD-10-CM | POA: Diagnosis not present

## 2015-12-11 DIAGNOSIS — A6009 Herpesviral infection of other urogenital tract: Secondary | ICD-10-CM | POA: Diagnosis present

## 2015-12-11 DIAGNOSIS — Z87891 Personal history of nicotine dependence: Secondary | ICD-10-CM | POA: Diagnosis not present

## 2015-12-11 DIAGNOSIS — Z880 Allergy status to penicillin: Secondary | ICD-10-CM

## 2015-12-11 DIAGNOSIS — I10 Essential (primary) hypertension: Secondary | ICD-10-CM | POA: Diagnosis present

## 2015-12-11 DIAGNOSIS — A6000 Herpesviral infection of urogenital system, unspecified: Secondary | ICD-10-CM | POA: Diagnosis present

## 2015-12-11 DIAGNOSIS — I359 Nonrheumatic aortic valve disorder, unspecified: Secondary | ICD-10-CM | POA: Diagnosis present

## 2015-12-11 DIAGNOSIS — Z91040 Latex allergy status: Secondary | ICD-10-CM | POA: Diagnosis not present

## 2015-12-11 DIAGNOSIS — M25562 Pain in left knee: Secondary | ICD-10-CM | POA: Diagnosis present

## 2015-12-11 DIAGNOSIS — M1712 Unilateral primary osteoarthritis, left knee: Secondary | ICD-10-CM | POA: Diagnosis not present

## 2015-12-11 DIAGNOSIS — E781 Pure hyperglyceridemia: Secondary | ICD-10-CM | POA: Diagnosis present

## 2015-12-11 DIAGNOSIS — G8918 Other acute postprocedural pain: Secondary | ICD-10-CM | POA: Diagnosis not present

## 2015-12-11 DIAGNOSIS — M179 Osteoarthritis of knee, unspecified: Secondary | ICD-10-CM | POA: Diagnosis not present

## 2015-12-11 DIAGNOSIS — Z791 Long term (current) use of non-steroidal anti-inflammatories (NSAID): Secondary | ICD-10-CM | POA: Diagnosis not present

## 2015-12-11 HISTORY — DX: Unilateral primary osteoarthritis, left knee: M17.12

## 2015-12-11 HISTORY — DX: Personal history of Methicillin resistant Staphylococcus aureus infection: Z86.14

## 2015-12-11 HISTORY — DX: Unspecified osteoarthritis, unspecified site: M19.90

## 2015-12-11 HISTORY — PX: TOTAL KNEE ARTHROPLASTY: SHX125

## 2015-12-11 SURGERY — ARTHROPLASTY, KNEE, TOTAL
Anesthesia: Spinal | Site: Knee | Laterality: Left

## 2015-12-11 MED ORDER — FENTANYL CITRATE (PF) 250 MCG/5ML IJ SOLN
INTRAMUSCULAR | Status: AC
  Filled 2015-12-11: qty 5

## 2015-12-11 MED ORDER — MEPERIDINE HCL 25 MG/ML IJ SOLN: 6.2500 mg | INTRAMUSCULAR | Status: DC | PRN

## 2015-12-11 MED ORDER — ACETAMINOPHEN 650 MG RE SUPP: 650.0000 mg | Freq: Four times a day (QID) | RECTAL | Status: DC | PRN

## 2015-12-11 MED ORDER — POTASSIUM CHLORIDE IN NACL 20-0.9 MEQ/L-% IV SOLN
INTRAVENOUS | Status: DC
  Administered 2015-12-11: 18:00:00 via INTRAVENOUS
  Filled 2015-12-11 (×2): qty 1000

## 2015-12-11 MED ORDER — ALUM & MAG HYDROXIDE-SIMETH 200-200-20 MG/5ML PO SUSP
30.0000 mL | ORAL | Status: DC | PRN
  Administered 2015-12-12: 30 mL via ORAL
  Filled 2015-12-11: qty 30

## 2015-12-11 MED ORDER — VALACYCLOVIR HCL 500 MG PO TABS: 500.0000 mg | ORAL_TABLET | Freq: Every day | ORAL | Status: DC | PRN

## 2015-12-11 MED ORDER — DEXAMETHASONE SODIUM PHOSPHATE 10 MG/ML IJ SOLN
INTRAMUSCULAR | Status: DC | PRN
  Administered 2015-12-11: 10 mg via INTRAVENOUS

## 2015-12-11 MED ORDER — BUPIVACAINE-EPINEPHRINE (PF) 0.25% -1:200000 IJ SOLN
INTRAMUSCULAR | Status: AC
  Filled 2015-12-11: qty 30

## 2015-12-11 MED ORDER — METOCLOPRAMIDE HCL 5 MG PO TABS: 5.0000 mg | ORAL_TABLET | Freq: Three times a day (TID) | ORAL | Status: DC | PRN

## 2015-12-11 MED ORDER — SODIUM CHLORIDE 0.9 % IR SOLN
Status: DC | PRN
  Administered 2015-12-11: 3000 mL
  Administered 2015-12-11: 1000 mL

## 2015-12-11 MED ORDER — HYDROMORPHONE HCL 1 MG/ML IJ SOLN
1.0000 mg | INTRAMUSCULAR | Status: DC | PRN
  Administered 2015-12-11 – 2015-12-12 (×4): 1 mg via INTRAVENOUS
  Filled 2015-12-11 (×3): qty 1

## 2015-12-11 MED ORDER — LOSARTAN POTASSIUM 50 MG PO TABS: 50.0000 mg | ORAL_TABLET | Freq: Every day | ORAL | Status: DC

## 2015-12-11 MED ORDER — ONDANSETRON HCL 4 MG PO TABS: 4.0000 mg | ORAL_TABLET | Freq: Four times a day (QID) | ORAL | Status: DC | PRN

## 2015-12-11 MED ORDER — APIXABAN 2.5 MG PO TABS
2.5000 mg | ORAL_TABLET | Freq: Two times a day (BID) | ORAL | Status: DC
  Administered 2015-12-12: 2.5 mg via ORAL
  Filled 2015-12-11: qty 1

## 2015-12-11 MED ORDER — ACETAMINOPHEN 325 MG PO TABS: 650.0000 mg | ORAL_TABLET | Freq: Four times a day (QID) | ORAL | Status: DC | PRN

## 2015-12-11 MED ORDER — CELECOXIB 200 MG PO CAPS
200.0000 mg | ORAL_CAPSULE | Freq: Two times a day (BID) | ORAL | Status: DC
  Administered 2015-12-11 – 2015-12-12 (×2): 200 mg via ORAL
  Filled 2015-12-11 (×2): qty 1

## 2015-12-11 MED ORDER — HYDROMORPHONE HCL 1 MG/ML IJ SOLN
INTRAMUSCULAR | Status: AC
  Filled 2015-12-11: qty 1

## 2015-12-11 MED ORDER — LIDOCAINE HCL (CARDIAC) 20 MG/ML IV SOLN
INTRAVENOUS | Status: DC | PRN
  Administered 2015-12-11: 40 mg via INTRAVENOUS

## 2015-12-11 MED ORDER — PROPOFOL 10 MG/ML IV BOLUS
INTRAVENOUS | Status: AC
  Filled 2015-12-11: qty 20

## 2015-12-11 MED ORDER — BUPIVACAINE IN DEXTROSE 0.75-8.25 % IT SOLN
INTRATHECAL | Status: DC | PRN
  Administered 2015-12-11: 2 mL via INTRATHECAL

## 2015-12-11 MED ORDER — METOCLOPRAMIDE HCL 5 MG/ML IJ SOLN: 5.0000 mg | Freq: Three times a day (TID) | INTRAMUSCULAR | Status: DC | PRN

## 2015-12-11 MED ORDER — ONDANSETRON HCL 4 MG/2ML IJ SOLN
4.0000 mg | Freq: Four times a day (QID) | INTRAMUSCULAR | Status: DC | PRN
  Administered 2015-12-11: 4 mg via INTRAVENOUS
  Filled 2015-12-11: qty 2

## 2015-12-11 MED ORDER — POLYETHYLENE GLYCOL 3350 17 G PO PACK
17.0000 g | PACK | Freq: Two times a day (BID) | ORAL | Status: DC
  Administered 2015-12-11 – 2015-12-12 (×2): 17 g via ORAL
  Filled 2015-12-11 (×2): qty 1

## 2015-12-11 MED ORDER — OXYCODONE HCL 5 MG PO TABS
ORAL_TABLET | ORAL | Status: AC
  Filled 2015-12-11: qty 3

## 2015-12-11 MED ORDER — DIPHENHYDRAMINE HCL 12.5 MG/5ML PO ELIX
12.5000 mg | ORAL_SOLUTION | ORAL | Status: DC | PRN
  Administered 2015-12-12: 25 mg via ORAL
  Filled 2015-12-11: qty 10

## 2015-12-11 MED ORDER — VANCOMYCIN HCL IN DEXTROSE 1-5 GM/200ML-% IV SOLN
1000.0000 mg | Freq: Two times a day (BID) | INTRAVENOUS | Status: AC
  Administered 2015-12-11: 1000 mg via INTRAVENOUS
  Filled 2015-12-11: qty 200

## 2015-12-11 MED ORDER — DOCUSATE SODIUM 100 MG PO CAPS
100.0000 mg | ORAL_CAPSULE | Freq: Two times a day (BID) | ORAL | Status: DC
  Administered 2015-12-11 – 2015-12-12 (×2): 100 mg via ORAL
  Filled 2015-12-11 (×2): qty 1

## 2015-12-11 MED ORDER — LIDOCAINE HCL (CARDIAC) 10 MG/ML IV SOLN: INTRAVENOUS | Status: DC | PRN

## 2015-12-11 MED ORDER — PHENYLEPHRINE HCL 10 MG/ML IJ SOLN
INTRAMUSCULAR | Status: DC | PRN
  Administered 2015-12-11 (×5): 80 ug via INTRAVENOUS

## 2015-12-11 MED ORDER — PHENYLEPHRINE HCL 10 MG/ML IJ SOLN
10.0000 mg | INTRAMUSCULAR | Status: DC | PRN
  Administered 2015-12-11: 50 ug/min via INTRAVENOUS

## 2015-12-11 MED ORDER — PHENOL 1.4 % MT LIQD
1.0000 | OROMUCOSAL | Status: DC | PRN
  Filled 2015-12-11: qty 177

## 2015-12-11 MED ORDER — PNEUMOCOCCAL VAC POLYVALENT 25 MCG/0.5ML IJ INJ
0.5000 mL | INJECTION | INTRAMUSCULAR | Status: AC
  Administered 2015-12-12: 0.5 mL via INTRAMUSCULAR
  Filled 2015-12-11: qty 0.5

## 2015-12-11 MED ORDER — EPHEDRINE SULFATE 50 MG/ML IJ SOLN
INTRAMUSCULAR | Status: DC | PRN
  Administered 2015-12-11: 5 mg via INTRAVENOUS
  Administered 2015-12-11: 10 mg via INTRAVENOUS
  Administered 2015-12-11: 5 mg via INTRAVENOUS

## 2015-12-11 MED ORDER — PROPOFOL 500 MG/50ML IV EMUL
INTRAVENOUS | Status: DC | PRN
  Administered 2015-12-11: 125 ug/kg/min via INTRAVENOUS

## 2015-12-11 MED ORDER — HYDROMORPHONE HCL 1 MG/ML IJ SOLN
0.2500 mg | INTRAMUSCULAR | Status: DC | PRN
  Administered 2015-12-11 (×4): 0.5 mg via INTRAVENOUS

## 2015-12-11 MED ORDER — BUPIVACAINE-EPINEPHRINE 0.25% -1:200000 IJ SOLN
INTRAMUSCULAR | Status: DC | PRN
  Administered 2015-12-11: 30 mL

## 2015-12-11 MED ORDER — SODIUM CHLORIDE 0.9 % IV SOLN: INTRAVENOUS | Status: DC | PRN

## 2015-12-11 MED ORDER — BUPIVACAINE IN DEXTROSE 0.75-8.25 % IT SOLN: INTRATHECAL | Status: DC | PRN

## 2015-12-11 MED ORDER — HYDROMORPHONE HCL 1 MG/ML IJ SOLN
INTRAMUSCULAR | Status: AC
  Administered 2015-12-11: 0.5 mg via INTRAVENOUS
  Filled 2015-12-11: qty 1

## 2015-12-11 MED ORDER — MIDAZOLAM HCL 2 MG/2ML IJ SOLN
INTRAMUSCULAR | Status: AC
  Filled 2015-12-11: qty 2

## 2015-12-11 MED ORDER — OXYCODONE HCL 5 MG PO TABS
5.0000 mg | ORAL_TABLET | ORAL | Status: DC | PRN
  Administered 2015-12-11 – 2015-12-12 (×5): 10 mg via ORAL
  Filled 2015-12-11 (×4): qty 2

## 2015-12-11 MED ORDER — MENTHOL 3 MG MT LOZG: 1.0000 | LOZENGE | OROMUCOSAL | Status: DC | PRN

## 2015-12-11 MED ORDER — DEXAMETHASONE SODIUM PHOSPHATE 10 MG/ML IJ SOLN
10.0000 mg | Freq: Three times a day (TID) | INTRAMUSCULAR | Status: DC
  Administered 2015-12-11 – 2015-12-12 (×3): 10 mg via INTRAVENOUS
  Filled 2015-12-11 (×3): qty 1

## 2015-12-11 MED ORDER — DIPHENHYDRAMINE HCL 50 MG/ML IJ SOLN
INTRAMUSCULAR | Status: DC | PRN
  Administered 2015-12-11: 12.5 mg via INTRAVENOUS

## 2015-12-11 MED ORDER — BUPIVACAINE-EPINEPHRINE (PF) 0.5% -1:200000 IJ SOLN
INTRAMUSCULAR | Status: DC | PRN
  Administered 2015-12-11: 30 mL via PERINEURAL

## 2015-12-11 MED ORDER — LACTATED RINGERS IV SOLN: INTRAVENOUS | Status: DC

## 2015-12-11 MED ORDER — PROMETHAZINE HCL 25 MG/ML IJ SOLN: 6.2500 mg | INTRAMUSCULAR | Status: DC | PRN

## 2015-12-11 SURGICAL SUPPLY — 72 items
APL SKNCLS STERI-STRIP NONHPOA (GAUZE/BANDAGES/DRESSINGS) ×1
BANDAGE ACE 6X5 VEL STRL LF (GAUZE/BANDAGES/DRESSINGS) ×1 IMPLANT
BANDAGE ESMARK 6X9 LF (GAUZE/BANDAGES/DRESSINGS) ×1 IMPLANT
BENZOIN TINCTURE PRP APPL 2/3 (GAUZE/BANDAGES/DRESSINGS) ×2 IMPLANT
BLADE SAGITTAL 25.0X1.19X90 (BLADE) ×2 IMPLANT
BLADE SAW RECIP 87.9 MT (BLADE) ×2 IMPLANT
BLADE SAW SAG 90X13X1.27 (BLADE) ×2 IMPLANT
BLADE SURG 10 STRL SS (BLADE) ×4 IMPLANT
BNDG CMPR 9X6 STRL LF SNTH (GAUZE/BANDAGES/DRESSINGS) ×1
BNDG CMPR MED 15X6 ELC VLCR LF (GAUZE/BANDAGES/DRESSINGS) ×1
BNDG ELASTIC 6X15 VLCR STRL LF (GAUZE/BANDAGES/DRESSINGS) ×2 IMPLANT
BNDG ESMARK 6X9 LF (GAUZE/BANDAGES/DRESSINGS) ×2
BOWL SMART MIX CTS (DISPOSABLE) ×2 IMPLANT
CAPT KNEE TOTAL 3 ×1 IMPLANT
CEMENT HV SMART SET (Cement) ×4 IMPLANT
CLSR STERI-STRIP ANTIMIC 1/2X4 (GAUZE/BANDAGES/DRESSINGS) ×1 IMPLANT
COVER SURGICAL LIGHT HANDLE (MISCELLANEOUS) ×2 IMPLANT
CUFF TOURNIQUET SINGLE 34IN LL (TOURNIQUET CUFF) ×2 IMPLANT
CUFF TOURNIQUET SINGLE 44IN (TOURNIQUET CUFF) IMPLANT
DECANTER SPIKE VIAL GLASS SM (MISCELLANEOUS) ×2 IMPLANT
DRAPE EXTREMITY T 121X128X90 (DRAPE) ×2 IMPLANT
DRAPE INCISE IOBAN 66X45 STRL (DRAPES) ×2 IMPLANT
DRAPE PROXIMA HALF (DRAPES) ×2 IMPLANT
DRAPE U-SHAPE 47X51 STRL (DRAPES) ×2 IMPLANT
DRSG AQUACEL AG ADV 3.5X10 (GAUZE/BANDAGES/DRESSINGS) ×2 IMPLANT
DRSG AQUACEL AG ADV 3.5X14 (GAUZE/BANDAGES/DRESSINGS) ×2 IMPLANT
DURAPREP 26ML APPLICATOR (WOUND CARE) ×4 IMPLANT
ELECT CAUTERY BLADE 6.4 (BLADE) ×2 IMPLANT
ELECT REM PT RETURN 9FT ADLT (ELECTROSURGICAL) ×2
ELECTRODE REM PT RTRN 9FT ADLT (ELECTROSURGICAL) ×1 IMPLANT
FACESHIELD WRAPAROUND (MASK) ×2 IMPLANT
FACESHIELD WRAPAROUND OR TEAM (MASK) ×1 IMPLANT
GLOVE BIO SURGEON STRL SZ7 (GLOVE) ×2 IMPLANT
GLOVE BIOGEL PI IND STRL 7.0 (GLOVE) ×1 IMPLANT
GLOVE BIOGEL PI IND STRL 7.5 (GLOVE) ×1 IMPLANT
GLOVE BIOGEL PI INDICATOR 7.0 (GLOVE) ×1
GLOVE BIOGEL PI INDICATOR 7.5 (GLOVE) ×1
GLOVE SS BIOGEL STRL SZ 7.5 (GLOVE) ×1 IMPLANT
GLOVE SUPERSENSE BIOGEL SZ 7.5 (GLOVE) ×1
GOWN STRL REUS W/ TWL LRG LVL3 (GOWN DISPOSABLE) ×1 IMPLANT
GOWN STRL REUS W/ TWL XL LVL3 (GOWN DISPOSABLE) ×2 IMPLANT
GOWN STRL REUS W/TWL LRG LVL3 (GOWN DISPOSABLE) ×2
GOWN STRL REUS W/TWL XL LVL3 (GOWN DISPOSABLE) ×4
HANDPIECE INTERPULSE COAX TIP (DISPOSABLE) ×2
HOOD PEEL AWAY FACE SHEILD DIS (HOOD) ×4 IMPLANT
IMMOBILIZER KNEE 22 UNIV (SOFTGOODS) ×2 IMPLANT
KIT BASIN OR (CUSTOM PROCEDURE TRAY) ×2 IMPLANT
KIT ROOM TURNOVER OR (KITS) ×2 IMPLANT
MANIFOLD NEPTUNE II (INSTRUMENTS) ×2 IMPLANT
MARKER SKIN DUAL TIP RULER LAB (MISCELLANEOUS) ×2 IMPLANT
NDL 18GX1X1/2 (RX/OR ONLY) (NEEDLE) ×1 IMPLANT
NEEDLE 18GX1X1/2 (RX/OR ONLY) (NEEDLE) ×2 IMPLANT
NS IRRIG 1000ML POUR BTL (IV SOLUTION) ×2 IMPLANT
PACK TOTAL JOINT (CUSTOM PROCEDURE TRAY) ×2 IMPLANT
PAD ARMBOARD 7.5X6 YLW CONV (MISCELLANEOUS) ×4 IMPLANT
SET HNDPC FAN SPRY TIP SCT (DISPOSABLE) ×1 IMPLANT
STRIP CLOSURE SKIN 1/2X4 (GAUZE/BANDAGES/DRESSINGS) ×2 IMPLANT
SUCTION FRAZIER HANDLE 10FR (MISCELLANEOUS) ×1
SUCTION TUBE FRAZIER 10FR DISP (MISCELLANEOUS) ×1 IMPLANT
SUT MNCRL AB 3-0 PS2 18 (SUTURE) ×2 IMPLANT
SUT VIC AB 0 CT1 27 (SUTURE) ×4
SUT VIC AB 0 CT1 27XBRD ANBCTR (SUTURE) ×2 IMPLANT
SUT VIC AB 1 CT1 27 (SUTURE) ×2
SUT VIC AB 1 CT1 27XBRD ANBCTR (SUTURE) ×1 IMPLANT
SUT VIC AB 2-0 CT1 27 (SUTURE) ×4
SUT VIC AB 2-0 CT1 TAPERPNT 27 (SUTURE) ×2 IMPLANT
SYR 30ML LL (SYRINGE) ×2 IMPLANT
TOWEL OR 17X24 6PK STRL BLUE (TOWEL DISPOSABLE) ×2 IMPLANT
TOWEL OR 17X26 10 PK STRL BLUE (TOWEL DISPOSABLE) ×2 IMPLANT
TRAY FOLEY CATH 16FR SILVER (SET/KITS/TRAYS/PACK) ×2 IMPLANT
TUBE CONNECTING 12X1/4 (SUCTIONS) ×2 IMPLANT
YANKAUER SUCT BULB TIP NO VENT (SUCTIONS) ×2 IMPLANT

## 2015-12-11 NOTE — Anesthesia Procedure Notes (Addendum)
Anesthesia Regional Block:  Adductor canal block  Pre-Anesthetic Checklist: ,, timeout performed, Correct Patient, Correct Site, Correct Laterality, Correct Procedure, Correct Position, site marked, Risks and benefits discussed,  Surgical consent,  Pre-op evaluation,  At surgeon's request and post-op pain management  Laterality: Left  Prep: chloraprep       Needles:  Injection technique: Single-shot  Needle Type: Echogenic Needle     Needle Length: 9cm 9 cm Needle Gauge: 21 and 21 G    Additional Needles:  Procedures: ultrasound guided (picture in chart) Adductor canal block Narrative:  Start time: 12/11/2015 7:05 AM End time: 12/11/2015 7:10 AM Injection made incrementally with aspirations every 5 mL.  Performed by: Personally  Anesthesiologist: Suella Broad D  Additional Notes: No immediate complications noted.    Spinal Patient location during procedure: OR Start time: 12/11/2015 7:20 AM End time: 12/11/2015 7:24 AM Staffing Anesthesiologist: Suella Broad D Performed by: anesthesiologist  Preanesthetic Checklist Completed: patient identified, site marked, surgical consent, pre-op evaluation, timeout performed, IV checked, risks and benefits discussed and monitors and equipment checked Spinal Block Patient position: sitting Prep: Betadine Patient monitoring: heart rate, continuous pulse ox, blood pressure and cardiac monitor Approach: midline Location: L4-5 Injection technique: single-shot Needle Needle type: Whitacre and Introducer  Needle gauge: 24 G Needle length: 9 cm Additional Notes Negative paresthesia. Negative blood return. Positive free-flowing CSF. Expiration date of kit checked and confirmed. Patient tolerated procedure well, without complications.

## 2015-12-11 NOTE — Anesthesia Postprocedure Evaluation (Signed)
Anesthesia Post Note  Patient: Rebecca Terry  Procedure(s) Performed: Procedure(s) (LRB): LEFT TOTAL KNEE ARTHROPLASTY (Left)  Patient location during evaluation: PACU Anesthesia Type: Spinal Level of consciousness: oriented and awake and alert Pain management: pain level controlled Vital Signs Assessment: post-procedure vital signs reviewed and stable Respiratory status: spontaneous breathing, respiratory function stable and patient connected to nasal cannula oxygen Cardiovascular status: blood pressure returned to baseline and stable Postop Assessment: no headache, no backache and spinal receding Anesthetic complications: no    Last Vitals:  Filed Vitals:   12/11/15 1100 12/11/15 1115  BP: 132/75 127/68  Pulse: 72 80  Temp:    Resp: 15 12    Last Pain:  Filed Vitals:   12/11/15 1125  PainSc: 0-No pain                 Effie Berkshire

## 2015-12-11 NOTE — Op Note (Signed)
NAMEMKENNA, SAAR NO.:  [id number]  MEDICAL RECORD NO.:  [id number]  LOCATION:  [facility]                         FACILITY:  [facility]  PHYSICIAN:  Paytience Bures A. Noemi Chapel, M.D. DATE OF BIRTH:  10-Dec-1950  DATE OF PROCEDURE:  12/11/2015 DATE OF DISCHARGE:                              OPERATIVE REPORT   PREOPERATIVE DIAGNOSIS:  Left knee primary localized osteoarthritis.  POSTOPERATIVE DIAGNOSIS:  Left knee primary localized osteoarthritis.  PROCEDURE:  Left total knee replacement using Smith and Nephew Oxinium total knee prosthesis with cemented #4 cemented femur, #4 cemented tibia with 11-mm polyethylene tibial spacer and 29-mm polyethylene cemented patella.  SURGEON:  Audree Camel. Noemi Chapel, M.D.  ASSISTANT:  Kirstin Shepperson, PA-C.  ANESTHESIA:  Spinal.  OPERATIVE TIME:  1 hour and 15 minutes.  COMPLICATIONS:  None.  DESCRIPTION OF PROCEDURE:  Ms. Gladfelter was brought into the operating room on December 11, 2015, after an adductor canal block had been placed in the holding room by Anesthesia.  She was placed on the operative table in a seated position and then spinal anesthesia was administered by the anesthesiologist without complications.  She was then placed supine on the operating table.  She received antibiotics preoperatively for prophylaxis.  Her left knee was examined.  Range of motion from 0- 135 degrees moderate varus deformity, knee stable, ligamentous exam with normal patellar tracking.  Left leg was prepped using sterile DuraPrep and draped using sterile technique.  Time-out procedure was called and the correct left knee identified.  Left leg was exsanguinated and a thigh tourniquet elevated to 350 mm.  Initially, through a 7-cm longitudinal incision based over the patella, initial exposure was made. The underlying subcutaneous tissues were incised along with skin incision.  A median arthrotomy was performed revealing an excessive amount of  normal-appearing joint fluid.  The articular surfaces were inspected.  She had grade 4 changes medially and in the patellofemoral joint grade 3 changes laterally.  The medial and lateral meniscal remnants were removed as well as the anterior cruciate ligament.  An intramedullary drill was drilled up the femoral canal for placement of the distal femoral cutting jig, which was placed in the appropriate manner of rotation and a distal 9-mm cut was made.  The proximal tibia was then exposed.  The proximal tibial cutting jig was placed in the appropriate manner of rotation and a proximal 4-mm cut was made based off the medial or lower side.  Spacer blocks were then placed in flexion and extension and 11-mm blocks gave excellent balancing.  The distal femoral cutting jig was then placed to size the distal femur, #4 was found to be appropriate size, and then #4 cutting block was placed in the appropriate manner of external rotation, and then the anterior, posterior, and chamfer cuts were then made.  At this point, again there was found to be excellent balancing in flexion and extension.  At this point, the proximal tibia was exposed.  A #4 tibial base plate was found to be an excellent fit and the keel cut was made in the appropriate manner of rotation.  The #4 femoral trial was placed, and then the box cutter and this trial was used to  cut the box through the central portion of the femoral trial.  At this point, then with the trial in place and an 11-mm polyethylene spacer, the knee was reduced, taken through a full range of motion, found to be stable with excellent correction of her flexion and varus deformities and normal patellar tracking.  A resurfacing 8-mm cut was then made on the patella and 3 locking holes placed for a 29 mm polyethylene patella.  The patellar trial was placed and again patellofemoral tracking was found to be normal.  At this point, it was felt that all the trial  components were of excellent size, fit, and stability.  They were then removed.  The knee was then jet lavage irrigated with 3 L of saline, and then the proximal tibia was exposed and #4 tibial base plate with cement backing was hammered into position with an excellent fit with excess cement being removed from around the edges.  #4 femoral component with cement backing was hammered into position also with an excellent fit with excess cement being removed from around the edges.  The 11-mm polyethylene tibial spacer was then locked on the tibial base plate as the knee was reduced.  Knee was brought through a full range of motion and found to be stable.  The 29-mm polyethylene cement backed patella was then locked in position as well.  At this point, after the cement hardened, again patellofemoral tracking was found to be normal.  At this point, it was felt that all the components were of excellent size, fit, and stability.  The knee was further irrigated with saline, and then the arthrotomy was closed with 0 and 2-0 Vicryl and subcutaneous tissues closed with 0 and 2-0 Vicryl, subcuticular layer closed with 4-0 Monocryl, the knee injected with 0.25% Marcaine with epinephrine. Sterile dressings were applied and a long-leg splint, and then the patient awakened and taken to recovery room in stable condition.  Needle and sponge counts were correct x2 at the end of the case.     Isaah Furry A. Noemi Chapel, M.D.     RAW/MEDQ  D:  12/11/2015  T:  12/11/2015  Job:  [id number]

## 2015-12-11 NOTE — Progress Notes (Signed)
Utilization review completed.  

## 2015-12-11 NOTE — Interval H&P Note (Signed)
History and Physical Interval Note:  12/11/2015 6:30 AM  Rebecca Terry  has presented today for surgery, with the diagnosis of UNILATERAL PRIMARY OSTEOARTHRITIS LEFT KNEE  The various methods of treatment have been discussed with the patient and family. After consideration of risks, benefits and other options for treatment, the patient has consented to  Procedure(s): LEFT TOTAL KNEE ARTHROPLASTY (Left) as a surgical intervention .  The patient's history has been reviewed, patient examined, no change in status, stable for surgery.  I have reviewed the patient's chart and labs.  Questions were answered to the patient's satisfaction.     Elsie Saas A

## 2015-12-11 NOTE — Transfer of Care (Signed)
Immediate Anesthesia Transfer of Care Note  Patient: Rebecca Terry  Procedure(s) Performed: Procedure(s): LEFT TOTAL KNEE ARTHROPLASTY (Left)  Patient Location: PACU  Anesthesia Type:MAC  Level of Consciousness: awake, alert , oriented and patient cooperative  Airway & Oxygen Therapy: Patient Spontanous Breathing and Patient connected to nasal cannula oxygen  Post-op Assessment: Report given to RN and Post -op Vital signs reviewed and stable  Post vital signs: Reviewed and stable  Last Vitals:  Filed Vitals:   12/11/15 0609 12/11/15 0934  BP: 160/79 89/58  Pulse: 77 78  Temp: 36.7 C 37 C  Resp: 20 14    Complications: No apparent anesthesia complications

## 2015-12-12 ENCOUNTER — Encounter (HOSPITAL_COMMUNITY): Payer: Self-pay | Admitting: Orthopedic Surgery

## 2015-12-12 LAB — BASIC METABOLIC PANEL WITH GFR
Anion gap: 8 (ref 5–15)
BUN: 8 mg/dL (ref 6–20)
CO2: 26 mmol/L (ref 22–32)
Calcium: 9.3 mg/dL (ref 8.9–10.3)
Chloride: 108 mmol/L (ref 101–111)
Creatinine, Ser: 0.72 mg/dL (ref 0.44–1.00)
GFR calc Af Amer: >60 mL/min
GFR calc non Af Amer: >60 mL/min
Glucose, Bld: 134 mg/dL — ABNORMAL HIGH (ref 65–99)
Potassium: 4.1 mmol/L (ref 3.5–5.1)
Sodium: 142 mmol/L (ref 135–145)

## 2015-12-12 LAB — CBC
HEMATOCRIT: 32 % — AB (ref 36.0–46.0)
HEMOGLOBIN: 10.4 g/dL — AB (ref 12.0–15.0)
MCH: 30.8 pg (ref 26.0–34.0)
MCHC: 32.5 g/dL (ref 30.0–36.0)
MCV: 94.7 fL (ref 78.0–100.0)
Platelets: 217 10*3/uL (ref 150–400)
RBC: 3.38 MIL/uL — ABNORMAL LOW (ref 3.87–5.11)
RDW: 13.3 % (ref 11.5–15.5)
WBC: 13.5 10*3/uL — AB (ref 4.0–10.5)

## 2015-12-12 MED ORDER — OXYCODONE HCL ER 20 MG PO T12A: EXTENDED_RELEASE_TABLET | ORAL | Status: DC

## 2015-12-12 MED ORDER — POLYETHYLENE GLYCOL 3350 17 G PO PACK: PACK | ORAL | Status: DC

## 2015-12-12 MED ORDER — DOCUSATE SODIUM 100 MG PO CAPS: ORAL_CAPSULE | ORAL | Status: DC

## 2015-12-12 MED ORDER — OXYCODONE HCL 5 MG PO TABS: ORAL_TABLET | ORAL | Status: DC

## 2015-12-12 MED ORDER — APIXABAN 2.5 MG PO TABS: 2.5000 mg | ORAL_TABLET | Freq: Two times a day (BID) | ORAL | Status: DC

## 2015-12-12 MED ORDER — OXYCODONE HCL ER 10 MG PO T12A
20.0000 mg | EXTENDED_RELEASE_TABLET | Freq: Two times a day (BID) | ORAL | Status: DC
  Administered 2015-12-12: 20 mg via ORAL
  Filled 2015-12-12 (×2): qty 2

## 2015-12-12 MED ORDER — CELECOXIB 200 MG PO CAPS: ORAL_CAPSULE | ORAL | Status: DC

## 2015-12-12 MED FILL — ELIQUIS 2.5 MG TABLET: 2.5 | 30 days supply | Qty: 60 | Fill #0

## 2015-12-12 MED FILL — oxyCODONE HCL 5 MG TABS: 5 | 8 days supply | Qty: 100 | Fill #0

## 2015-12-12 MED FILL — OxyCONTIN 20 MG T12A: 20 | 15 days supply | Qty: 30 | Fill #0

## 2015-12-12 MED FILL — CELECOXIB 200 MG CAPSULE: 200 | 30 days supply | Qty: 30 | Fill #0

## 2015-12-12 NOTE — Progress Notes (Signed)
Pt ready for d/c per MD. Cleared by PT/OT, has needed equipment at home. Discharge teaching and prescriptions reviewed with pt and her daughter, denied any questions.   Raquel James  12/12/2015

## 2015-12-12 NOTE — Evaluation (Signed)
Physical Therapy Evaluation Patient Details Name: Rebecca Terry MRN: [id number] DOB: October 28, 1950 Today's Date: 12/12/2015   History of Present Illness  Patient is a 65 yo female admitted 12/11/15 now s/p Lt TKA.  PMH:  HTN, OA, aortic valve disorder  Clinical Impression  Patient is functioning at min guard assist for all mobility and gait.  Able to ambulate 110' with RW and min guard assist.  Patient has all equipment needed.  Patient safe to discharge home from PT perspective.  Will have f/u HHPT to continue therapy at discharge.    Follow Up Recommendations Home health PT;Supervision for mobility/OOB    Equipment Recommendations  None recommended by PT    Recommendations for Other Services       Precautions / Restrictions Precautions Precautions: Knee Precaution Booklet Issued: Yes (comment) Precaution Comments: Reviewed precautions with patient and daughter Required Braces or Orthoses: Knee Immobilizer - Left Knee Immobilizer - Left: On when out of bed or walking Restrictions Weight Bearing Restrictions: Yes LLE Weight Bearing: Weight bearing as tolerated      Mobility  Bed Mobility Overal bed mobility: Needs Assistance Bed Mobility: Sit to Supine       Sit to supine: Min guard   General bed mobility comments: Verbal cues for technique including use of RLE to assist LLE onto bed.  Patient able to perform with min guard for safety.  Transfers Overall transfer level: Needs assistance Equipment used: Rolling walker (2 wheeled) Transfers: Sit to/from Stand Sit to Stand: Min guard         General transfer comment: Verbal cues for hand placement.  Assist for safety only.  Ambulation/Gait Ambulation/Gait assistance: Min guard Ambulation Distance (Feet): 110 Feet Assistive device: Rolling walker (2 wheeled) Gait Pattern/deviations: Step-through pattern;Decreased stance time - left;Decreased stride length;Decreased weight shift to left;Antalgic Gait velocity:  decreased Gait velocity interpretation: Below normal speed for age/gender General Gait Details: Verbal cues for safe use of RW, gait sequence, and heel-toe gait.  Patient with slow, steady gait.    Stairs            Wheelchair Mobility    Modified Rankin (Stroke Patients Only)       Balance                                             Pertinent Vitals/Pain Pain Assessment: 0-10 Pain Score: 6  Pain Location: Lt knee Pain Descriptors / Indicators: Aching;Sore Pain Intervention(s): Limited activity within patient's tolerance;Monitored during session;Repositioned;Ice applied    Home Living Family/patient expects to be discharged to:: Private residence Living Arrangements: Alone Available Help at Discharge: Family;Friend(s);Available 24 hours/day (Daughter and friends) Type of Home: House Home Access: Stairs to enter Entrance Stairs-Rails: None Entrance Stairs-Number of Steps: 1 Home Layout: One level Home Equipment: Environmental consultant - 2 wheels;Bedside commode      Prior Function Level of Independence: Independent         Comments: Works in [place]        Extremity/Trunk Assessment   Upper Extremity Assessment: Overall WFL for tasks assessed           Lower Extremity Assessment: LLE deficits/detail   LLE Deficits / Details: Decreased strength and ROM post-op  Cervical / Trunk Assessment: Normal  Communication   Communication: No difficulties  Cognition Arousal/Alertness: Awake/alert Behavior During Therapy: WFL for tasks  assessed/performed Overall Cognitive Status: Within Functional Limits for tasks assessed                      General Comments      Exercises Total Joint Exercises Ankle Circles/Pumps: AROM;Both;10 reps;Supine Quad Sets: AROM;Left;5 reps;Supine Towel Squeeze: AROM;Both;5 reps;Supine Short Arc Quad: AROM;Left;5 reps;Supine Heel Slides: AROM;Left;5 reps;Supine Hip ABduction/ADduction:  AROM;Left;5 reps;Supine Long Arc Quad: AROM;Left;5 reps;Seated;Limitations Long [employer] Limitations: Could just clear foot from floor Knee Flexion: AROM;Left;5 reps;Seated Goniometric ROM: -10* ext, 80* flexion      Assessment/Plan    PT Assessment All further PT needs can be met in the next venue of care  PT Diagnosis Difficulty walking;Acute pain   PT Problem List Decreased strength;Decreased range of motion;Decreased activity tolerance;Decreased balance;Decreased mobility;Decreased knowledge of precautions;Pain  PT Treatment Interventions  (Patient to have f/u HHPT)   PT Goals (Current goals can be found in the Care Plan section) Acute Rehab PT Goals PT Goal Formulation: All assessment and education complete, DC therapy    Frequency     Barriers to discharge        Co-evaluation               End of Session Equipment Utilized During Treatment: Gait belt;Left knee immobilizer Activity Tolerance: Patient tolerated treatment well;Patient limited by pain Patient left: in bed;with call bell/phone within reach;with family/visitor present Nurse Communication: Mobility status;Patient requests pain meds         Time: 0931-1010 PT Time Calculation (min) (ACUTE ONLY): 39 min   Charges:   PT Evaluation $PT Eval Moderate Complexity: 1 Procedure PT Treatments $Gait Training: 8-22 mins $Therapeutic Exercise: 8-22 mins   PT G Codes:        Despina Pole December 22, 2015, 10:44 AM Carita Pian. Sanjuana Kava, [place] Pager [phone]

## 2015-12-12 NOTE — Care Management Note (Signed)
Case Management Note  Patient Details  Name: BRIGETTA HOLLENBACH MRN: [id number] Date of Birth: [birth year]  Subjective/Objective:    S/p left total knee arthroplasty                Action/Plan: Set up with [facility] for HHPT by MD office. Spoke with patient, no change in discharge plan. Patient stated that her daughter will be assisting her after discharge.[employer] delivered CPM, rolling walker and 3N1 to patient's home.    Expected Discharge Date:   12/12/15               Expected Discharge Plan:  [place]  In-House Referral:  NA  Discharge planning Services  CM Consult  Post Acute Care Choice:  Durable Medical Equipment, Home Health Choice offered to:  Patient  DME Arranged:  3-N-1, CPM, Walker rolling DME Agency:  [employer]  HH Arranged:  PT HH Agency:  [facility]  Status of Service:  Completed, signed off  Medicare Important Message Given:    Date Medicare IM Given:    Medicare IM give by:    Date Additional Medicare IM Given:    Additional Medicare Important Message give by:     If discussed at [place] of Stay Meetings, dates discussed:    Additional Comments:  Nila Nephew, RN 12/12/2015, 10:47 AM

## 2015-12-12 NOTE — Progress Notes (Signed)
Orthopedic Tech Progress Note Patient Details:  Rebecca Terry 1951/09/26 [id number]  Ortho Devices Type of Ortho Device: Knee Immobilizer Ortho Device/Splint Interventions: Application   [name] 12/12/2015, 8:39 AM

## 2015-12-12 NOTE — Discharge Summary (Signed)
Patient ID: Rebecca Terry MRN: [id number] DOB/AGE: 02/29/1951 65 y.o.  Admit date: 12/11/2015 Discharge date: 12/12/2015  Admission Diagnoses:  Principal Problem:   Primary localized osteoarthritis of left knee Active Problems:   Aortic valve disorder   Essential hypertension, benign   History of MRSA infection   Herpes genitalis in women   Gilbert's syndrome   Discharge Diagnoses:  Same  Past Medical History  Diagnosis Date  . Fibroid     posterior  . Hypertension   . Urinary incontinence   . Aortic valve disorders   . Essential hypertension, benign   . Herpes genitalis in women   . Rectocele   . Diverticulosis   . Abnormal Pap smear of cervix     09/2013 Ascus:Pos HR HPV, 03/2014 Ascus:Pos HR HPV;colpo 05/2014 LGSIL of cx and VIN 1of perianal region  . Arthritis   . History of MRSA infection 07/2015    culture positive [facility]  . Primary localized osteoarthritis of left knee     Surgeries: Procedure(s): LEFT TOTAL KNEE ARTHROPLASTY on 12/11/2015   Consultants:    Discharged Condition: Improved  Hospital Course: Rebecca Terry is an 65 y.o. female who was admitted 12/11/2015 for operative treatment ofPrimary localized osteoarthritis of left knee. Patient has severe unremitting pain that affects sleep, daily activities, and work/hobbies. After pre-op clearance the patient was taken to the operating room on 12/11/2015 and underwent  Procedure(s): LEFT TOTAL KNEE ARTHROPLASTY.    Patient was given perioperative antibiotics: Anti-infectives    Start     Dose/Rate Route Frequency Ordered Stop   12/11/15 1645  vancomycin (VANCOCIN) IVPB 1000 mg/200 mL premix     1,000 mg 200 mL/hr over 60 Minutes Intravenous Every 12 hours 12/11/15 1640 12/11/15 1840   12/11/15 1640  valACYclovir (VALTREX) tablet 500 mg     500 mg Oral Daily PRN 12/11/15 1640     12/11/15 0700  vancomycin (VANCOCIN) IVPB 1000 mg/200 mL premix     1,000 mg 200 mL/hr over 60 Minutes  Intravenous To [facility] 12/08/15 1421 12/11/15 0719       Patient was given sequential compression devices, early ambulation, and chemoprophylaxis to prevent DVT.  Patient benefited maximally from hospital stay and there were no complications.    Recent vital signs: Patient Vitals for the past 24 hrs:  BP Temp Temp src Pulse Resp SpO2  12/12/15 0513 124/70 mmHg 98.1 F (36.7 C) - 91 17 96 %  12/11/15 2108 120/60 mmHg 98.3 F (36.8 C) Oral 83 16 96 %  12/11/15 1645 128/75 mmHg 98.7 F (37.1 C) - 84 14 96 %  12/11/15 1629 - 98.4 F (36.9 C) - 80 15 95 %  12/11/15 1626 - - - 78 11 93 %  12/11/15 1615 - - - 78 15 95 %  12/11/15 1600 131/83 mmHg - - 89 19 98 %  12/11/15 1545 - - - 78 14 96 %  12/11/15 1530 - - - 76 13 95 %  12/11/15 1515 - - - 79 14 96 %  12/11/15 1500 133/67 mmHg - - 80 13 95 %  12/11/15 1445 - - - 83 16 95 %  12/11/15 1430 - - - 73 13 94 %  12/11/15 1415 - - - 74 13 93 %  12/11/15 1400 137/75 mmHg 97.9 F (36.6 C) - 72 12 96 %  12/11/15 1345 - - - 84 13 99 %  12/11/15 1330 (!) 120/57 mmHg - - 82  20 93 %  12/11/15 1315 131/78 mmHg - - 77 13 96 %  12/11/15 1300 134/72 mmHg - - 80 12 93 %  12/11/15 1245 132/81 mmHg - - 90 19 98 %  12/11/15 1230 128/79 mmHg - - 85 20 98 %  12/11/15 1215 129/72 mmHg - - 82 19 97 %  12/11/15 1200 126/71 mmHg - - 74 13 96 %  12/11/15 1145 140/77 mmHg - - 77 16 97 %  12/11/15 1130 129/74 mmHg - - 83 13 98 %  12/11/15 1115 127/68 mmHg - - 80 12 97 %  12/11/15 1100 132/75 mmHg - - 72 15 100 %  12/11/15 1045 123/72 mmHg - - 72 18 100 %  12/11/15 1030 126/70 mmHg - - 67 15 100 %  12/11/15 1015 119/66 mmHg - - 70 13 100 %  12/11/15 1000 129/74 mmHg - - 76 (!) 22 98 %  12/11/15 0945 120/74 mmHg - - 74 14 98 %  12/11/15 0934 (!) 89/58 mmHg 98.6 F (37 C) - 78 14 98 %     Recent laboratory studies:  Recent Labs  12/12/15 0730  WBC 13.5*  HGB 10.4*  HCT 32.0*  PLT 217  NA 142  K 4.1  CL 108  CO2 26  BUN 8   CREATININE 0.72  GLUCOSE 134*  CALCIUM 9.3     Discharge Medications:     Medication List    STOP taking these medications        estrogen (conjugated)-medroxyprogesterone 0.3-1.5 MG tablet  Commonly known as:  PREMPRO      TAKE these medications        apixaban 2.5 MG Tabs tablet  Commonly known as:  ELIQUIS  Take 1 tablet (2.5 mg total) by mouth every 12 (twelve) hours.     celecoxib 200 MG capsule  Commonly known as:  CELEBREX  1 tab po q day with food for pain and  swelling     clobetasol cream 0.05 %  Commonly known as:  TEMOVATE  Apply 1 application topically daily as needed.     docusate sodium 100 MG capsule  Commonly known as:  COLACE  1 tab 2 times a day while on narcotics.  STOOL SOFTENER     losartan 50 MG tablet  Commonly known as:  COZAAR  Take 50 mg by mouth daily.     mupirocin ointment 2 %  Commonly known as:  BACTROBAN  Apply 1 application topically daily as needed (for skin).     oxyCODONE 5 MG immediate release tablet  Commonly known as:  Oxy IR/ROXICODONE  1-2 tablets every 4-6 hrs as needed for pain SHORT ACTING PAIN MEDICATION     oxyCODONE 20 mg 12 hr tablet  Commonly known as:  OXYCONTIN  1 PO EVERY 12 HRS   LONG ACTING PAIN MEDICATION     polyethylene glycol packet  Commonly known as:  MIRALAX / GLYCOLAX  17grams in 4 oz of water twice a day until bowel movement.  LAXITIVE.  Restart if two days since last bowel movement     valACYclovir 500 MG tablet  Commonly known as:  VALTREX  Take 500 mg by mouth daily as needed (for outbreaks).        Diagnostic Studies: No results found.  Disposition: 01-Home or Self Care      Discharge Instructions    CPM    Complete by:  As directed   Continuous passive motion machine (CPM):  Use the CPM from 0 to 90 for 6 hours per day.       You may break it up into 2 or 3 sessions per day.      Use CPM for 2 weeks or until you are told to stop.     Call MD / Call 911    Complete by:   As directed   If you experience chest pain or shortness of breath, CALL 911 and be transported to the hospital emergency room.  If you develope a fever above 101 F, pus (white drainage) or increased drainage or redness at the wound, or calf pain, call your surgeon's office.     Change dressing    Complete by:  As directed   Change the gauze dressing daily with sterile 4 x 4 inch gauze and apply TED hose.  DO NOT REMOVE BANDAGE OVER SURGICAL INCISION.  [place] WHOLE LEG INCLUDING OVER THE WATERPROOF BANDAGE WITH SOAP AND WATER EVERY DAY.     Constipation Prevention    Complete by:  As directed   Drink plenty of fluids.  Prune juice may be helpful.  You may use a stool softener, such as Colace (over the counter) 100 mg twice a day.  Use MiraLax (over the counter) for constipation as needed.     Diet - low sodium heart healthy    Complete by:  As directed      Discharge instructions    Complete by:  As directed   INSTRUCTIONS AFTER JOINT REPLACEMENT   Remove items at home which could result in a fall. This includes throw rugs or furniture in walking pathways ICE to the affected joint every three hours while awake for 30 minutes at a time, for at least the first 3-5 days, and then as needed for pain and swelling.  Continue to use ice for pain and swelling. You may notice swelling that will progress down to the foot and ankle.  This is normal after surgery.  Elevate your leg when you are not up walking on it.   Continue to use the breathing machine you got in the hospital (incentive spirometer) which will help keep your temperature down.  It is common for your temperature to cycle up and down following surgery, especially at night when you are not up moving around and exerting yourself.  The breathing machine keeps your lungs expanded and your temperature down.   DIET:  As you were doing prior to hospitalization, we recommend a well-balanced diet.  DRESSING / WOUND CARE / SHOWERING  Keep the surgical  dressing until follow up.  The dressing is water proof, so you can shower without any extra covering.  IF THE DRESSING FALLS OFF or the wound gets wet inside, change the dressing with sterile gauze.  Please use good hand washing techniques before changing the dressing.  Do not use any lotions or creams on the incision until instructed by your surgeon.    ACTIVITY  Increase activity slowly as tolerated, but follow the weight bearing instructions below.   No driving for 6 weeks or until further direction given by your physician.  You cannot drive while taking narcotics.  No lifting or carrying greater than 10 lbs. until further directed by your surgeon. Avoid periods of inactivity such as sitting longer than an hour when not asleep. This helps prevent blood clots.  You may return to work once you are authorized by your doctor.     WEIGHT BEARING   Weight  bearing as tolerated with assist device (walker, cane, etc) as directed, use it as long as suggested by your surgeon or therapist, typically at least 2-3 weeks.   EXERCISES  Results after joint replacement surgery are often greatly improved when you follow the exercise, range of motion and muscle strengthening exercises prescribed by your doctor. Safety measures are also important to protect the joint from further injury. Any time any of these exercises cause you to have increased pain or swelling, decrease what you are doing until you are comfortable again and then slowly increase them. If you have problems or questions, call your caregiver or physical therapist for advice.   Rehabilitation is important following a joint replacement. After just a few days of immobilization, the muscles of the leg can become weakened and shrink (atrophy).  These exercises are designed to build up the tone and strength of the thigh and leg muscles and to improve motion. Often times heat used for twenty to thirty minutes before working out will loosen up your  tissues and help with improving the range of motion but do not use heat for the first two weeks following surgery (sometimes heat can increase post-operative swelling).   These exercises can be done on a training (exercise) mat, on the floor, on a table or on a bed. Use whatever works the best and is most comfortable for you.    Use music or television while you are exercising so that the exercises are a pleasant break in your day. This will make your life better with the exercises acting as a break in your routine that you can look forward to.   Perform all exercises about fifteen times, three times per day or as directed.  You should exercise both the operative leg and the other leg as well.   Exercises include:  Quad Sets - Tighten up the muscle on the front of the thigh (Quad) and hold for 5-10 seconds.   Straight Leg Raises - With your knee straight (if you were given a brace, keep it on), lift the leg to 60 degrees, hold for 3 seconds, and slowly lower the leg.  Perform this exercise against resistance later as your leg gets stronger.  Leg Slides: Lying on your back, slowly slide your foot toward your buttocks, bending your knee up off the floor (only go as far as is comfortable). Then slowly slide your foot back down until your leg is flat on the floor again.  Angel Wings: Lying on your back spread your legs to the side as far apart as you can without causing discomfort.  Hamstring Strength:  Lying on your back, push your heel against the floor with your leg straight by tightening up the muscles of your buttocks.  Repeat, but this time bend your knee to a comfortable angle, and push your heel against the floor.  You may put a pillow under the heel to make it more comfortable if necessary.   A rehabilitation program following joint replacement surgery can speed recovery and prevent re-injury in the future due to weakened muscles. Contact your doctor or a physical therapist for more information on  knee rehabilitation.    CONSTIPATION  Constipation is defined medically as fewer than three stools per week and severe constipation as less than one stool per week.  Even if you have a regular bowel pattern at home, your normal regimen is likely to be disrupted due to multiple reasons following surgery.  Combination of anesthesia, postoperative narcotics,  change in appetite and fluid intake all can affect your bowels.   YOU MUST use at least one of the following options; they are listed in order of increasing strength to get the job done.  They are all available over the counter, and you may need to use some, POSSIBLY even all of these options:    Drink plenty of fluids (prune juice may be helpful) and high fiber foods Colace 100 mg by mouth twice a day  Senokot for constipation as directed and as needed Dulcolax (bisacodyl), take with full glass of water  Miralax (polyethylene glycol) once or twice a day as needed.  If you have tried all these things and are unable to have a bowel movement in the first 3-4 days after surgery call either your surgeon or your primary doctor.    If you experience loose stools or diarrhea, hold the medications until you stool forms back up.  If your symptoms do not get better within 1 week or if they get worse, check with your doctor.  If you experience "the worst abdominal pain ever" or develop nausea or vomiting, please contact the office immediately for further recommendations for treatment.   ITCHING:  If you experience itching with your medications, try taking only a single pain pill, or even half a pain pill at a time.  You can also use Benadryl over the counter for itching or also to help with sleep.   TED HOSE STOCKINGS:  Use stockings on both legs until for at least 2 weeks or as directed by physician office. They may be removed at night for sleeping.  MEDICATIONS:  See your medication summary on the "After Visit Summary" that nursing will review with  you.  You may have some home medications which will be placed on hold until you complete the course of blood thinner medication.  It is important for you to complete the blood thinner medication as prescribed.  PRECAUTIONS:  If you experience chest pain or shortness of breath - call 911 immediately for transfer to the hospital emergency department.   If you develop a fever greater that 101 F, purulent drainage from wound, increased redness or drainage from wound, foul odor from the wound/dressing, or calf pain - CONTACT YOUR SURGEON.                                                   FOLLOW-UP APPOINTMENTS:  If you do not already have a post-op appointment, please call the office for an appointment to be seen by your surgeon.  Guidelines for how soon to be seen are listed in your "After Visit Summary", but are typically between 1-4 weeks after surgery.  OTHER INSTRUCTIONS:   Knee Replacement:  Do not place pillow under knee, focus on keeping the knee straight while resting. CPM instructions: 0-90 degrees, 2 hours in the morning, 2 hours in the afternoon, and 2 hours in the evening. Place foam block, curve side up under heel at all times except when in CPM or when walking.  DO NOT modify, tear, cut, or change the foam block in any way.  MAKE SURE YOU:  Understand these instructions.  Get help right away if you are not doing well or get worse.    Thank you for letting us be a part of your medical care team.  It is a privilege we respect greatly.  We hope these instructions will help you stay on track for a fast and full recovery!     Do not put a pillow under the knee. Place it under the heel.    Complete by:  As directed   Place gray foam block, curve side up under heel at all times except when in CPM or when walking.  DO NOT modify, tear, cut, or change in any way the gray foam block.     Increase activity slowly as tolerated    Complete by:  As directed      Patient may shower    Complete by:   As directed   Aquacel dressing is water proof    Wash over it and the whole leg with soap and water at the end of your shower     TED hose    Complete by:  As directed   Use stockings (TED hose) for 2 weeks on both leg(s).  You may remove them at night for sleeping.           Follow-up Information    Follow up with Lorn Junes, MD On 12/25/2015.   Specialty:  Orthopedic Surgery   Why:  APPT TIME 2:30 PM   Contact information:   [place] [address] [phone]        Signed: Linda Hedges 12/12/2015, 9:00 AM

## 2015-12-12 NOTE — Progress Notes (Signed)
Occupational Therapy Evaluation/Discharge Patient Details Name: Rebecca Terry MRN: [id number] DOB: 08-24-1951 Today's Date: 12/12/2015    History of Present Illness Patient is a 65 yo female admitted 12/11/15 now s/p Lt TKA.  PMH:  HTN, OA, aortic valve disorder   Clinical Impression   Patient evaluated by Occupational Therapy with no further acute OT needs identified. Pt performed functional transfers at supervision level and LB ADLs with min assist which pt's daughter can provide. Educated pt on knee precautions, 0 bone foam and CPM use, compensatory strategies for ADLs, fall prevention, energy conservation, and pain/edema management. All education has been completed and pt isn adequate for discharge from occupational therapy standpoint. OT signing off.     Follow Up Recommendations  No OT follow up;Supervision - Intermittent    Equipment Recommendations  None recommended by OT    Recommendations for Other Services       Precautions / Restrictions Precautions Precautions: Knee Precaution Booklet Issued: No Precaution Comments: Reviewed not placing pillow or other object underneath knee Required Braces or Orthoses: Knee Immobilizer - Left Knee Immobilizer - Left: On when out of bed or walking Restrictions Weight Bearing Restrictions: Yes LLE Weight Bearing: Weight bearing as tolerated      Mobility Bed Mobility Overal bed mobility: Needs Assistance Bed Mobility: Supine to Sit     Supine to sit: Min assist Sit to supine: Min guard   General bed mobility comments: HOB sligtly elevated, no use of bedrails to simulate home environment. Min assist to progress LLE off bed.  Transfers Overall transfer level: Needs assistance Equipment used: Rolling walker (2 wheeled) Transfers: Sit to/from Stand Sit to Stand: Supervision         General transfer comment: Supervision for safety. Verbal cues for safe hand placement.    Balance Overall balance assessment: Needs  assistance Sitting-balance support: No upper extremity supported;Feet supported Sitting balance-Leahy Scale: Good     Standing balance support: Bilateral upper extremity supported;During functional activity Standing balance-Leahy Scale: Fair                              ADL Overall ADL's : Needs assistance/impaired     Grooming: Wash/dry hands;Supervision/safety;Standing           Upper Body Dressing : Supervision/safety;Sitting   Lower Body Dressing: Minimal assistance;Cueing for compensatory techniques;Sit to/from stand Lower Body Dressing Details (indicate cue type and reason): Unable to reach bilateral LE but daughter can assist, Cues to dress LLE first and undress it last Toilet Transfer: Supervision/safety;Cueing for safety;Ambulation;BSC;RW Toilet Transfer Details (indicate cue type and reason): BSC over toilet, cues to feel BSC on back of legs before reaching back to sit Toileting- Clothing Manipulation and Hygiene: Supervision/safety;Sit to/from stand   Tub/ Shower Transfer: Walk-in shower;Supervision/safety;Cueing for sequencing;Ambulation;Rolling walker Tub/Shower Transfer Details (indicate cue type and reason): Cues for proper step sequence with RW Functional mobility during ADLs: Supervision/safety;Rolling walker General ADL Comments: Reviewed knee precautions and compensatory strategies for ADLs and transfers. Reviewed fall prevention, energy conservation, and pain/edema management strategies.     Vision Vision Assessment?: No apparent visual deficits   Perception     Praxis      Pertinent Vitals/Pain Pain Assessment: 0-10 Pain Score: 4  Pain Location: L knee Pain Descriptors / Indicators: Aching Pain Intervention(s): Limited activity within patient's tolerance;Monitored during session;Repositioned;Ice applied     Hand Dominance Left   Extremity/Trunk Assessment Upper Extremity Assessment Upper Extremity Assessment: Overall [facility]  for tasks  assessed   Lower Extremity Assessment Lower Extremity Assessment: LLE deficits/detail LLE Deficits / Details: Decreased strength and ROM post-op   Cervical / Trunk Assessment Cervical / Trunk Assessment: Normal   Communication Communication Communication: No difficulties   Cognition Arousal/Alertness: Awake/alert Behavior During Therapy: WFL for tasks assessed/performed Overall Cognitive Status: Within Functional Limits for tasks assessed                     General Comments       Exercises Exercises: Total Joint     Shoulder Instructions      Home Living Family/patient expects to be discharged to:: Private residence Living Arrangements: Alone Available Help at Discharge: Family;Friend(s);Available 24 hours/day Type of Home: House Home Access: Stairs to enter [employer] of Steps: 1 Entrance Stairs-Rails: None Home Layout: One level     Bathroom Shower/Tub: Door;Walk-in Psychologist, prison and probation services: Standard Bathroom Accessibility: No   Home Equipment: Environmental consultant - 2 wheels;Bedside commode;Hand held shower head;Shower seat          Prior Functioning/Environment Level of Independence: Independent        Comments: Works as Passenger transport manager at [employer]    OT Diagnosis: Acute pain   OT Problem List: Decreased strength;Decreased range of motion;Decreased activity tolerance;Impaired balance (sitting and/or standing);Decreased coordination;Decreased safety awareness;Decreased knowledge of precautions;Decreased knowledge of use of DME or AE;Pain   OT Treatment/Interventions:      OT Goals(Current goals can be found in the care plan section) Acute Rehab OT Goals Patient Stated Goal: to go home OT Goal Formulation: With patient Time For Goal Achievement: 12/26/15 Potential to Achieve Goals: Good  OT Frequency:     Barriers to D/C:            Co-evaluation              End of Session Equipment Utilized During Treatment: Gait  belt;Rolling walker;Left knee immobilizer CPM Left Knee CPM Left Knee: Off Additional Comments: 0 bone foam applied Nurse Communication: Mobility status  Activity Tolerance: Patient tolerated treatment well Patient left: in chair;with call bell/phone within reach   Time: [id number] OT Time Calculation (min): 26 min Charges:  OT General Charges $OT Visit: 1 Procedure OT Evaluation $OT Eval Moderate Complexity: 1 Procedure OT Treatments $Self Care/Home Management : 8-22 mins G-Codes:    Redmond Baseman, OTR/L Pager: [phone] 12/12/2015, 12:47 PM

## 2015-12-13 DIAGNOSIS — Z87891 Personal history of nicotine dependence: Secondary | ICD-10-CM | POA: Diagnosis not present

## 2015-12-13 DIAGNOSIS — M47812 Spondylosis without myelopathy or radiculopathy, cervical region: Secondary | ICD-10-CM | POA: Diagnosis not present

## 2015-12-13 DIAGNOSIS — Z8614 Personal history of Methicillin resistant Staphylococcus aureus infection: Secondary | ICD-10-CM | POA: Diagnosis not present

## 2015-12-13 DIAGNOSIS — Z471 Aftercare following joint replacement surgery: Secondary | ICD-10-CM | POA: Diagnosis not present

## 2015-12-13 DIAGNOSIS — Z96652 Presence of left artificial knee joint: Secondary | ICD-10-CM | POA: Diagnosis not present

## 2015-12-13 DIAGNOSIS — I358 Other nonrheumatic aortic valve disorders: Secondary | ICD-10-CM | POA: Diagnosis not present

## 2015-12-13 DIAGNOSIS — Z7901 Long term (current) use of anticoagulants: Secondary | ICD-10-CM | POA: Diagnosis not present

## 2015-12-13 DIAGNOSIS — I10 Essential (primary) hypertension: Secondary | ICD-10-CM | POA: Diagnosis not present

## 2015-12-13 DIAGNOSIS — M1990 Unspecified osteoarthritis, unspecified site: Secondary | ICD-10-CM | POA: Diagnosis not present

## 2015-12-14 DIAGNOSIS — Z87891 Personal history of nicotine dependence: Secondary | ICD-10-CM | POA: Diagnosis not present

## 2015-12-14 DIAGNOSIS — M1990 Unspecified osteoarthritis, unspecified site: Secondary | ICD-10-CM | POA: Diagnosis not present

## 2015-12-14 DIAGNOSIS — M47812 Spondylosis without myelopathy or radiculopathy, cervical region: Secondary | ICD-10-CM | POA: Diagnosis not present

## 2015-12-14 DIAGNOSIS — Z7901 Long term (current) use of anticoagulants: Secondary | ICD-10-CM | POA: Diagnosis not present

## 2015-12-14 DIAGNOSIS — Z96652 Presence of left artificial knee joint: Secondary | ICD-10-CM | POA: Diagnosis not present

## 2015-12-14 DIAGNOSIS — I358 Other nonrheumatic aortic valve disorders: Secondary | ICD-10-CM | POA: Diagnosis not present

## 2015-12-14 DIAGNOSIS — Z471 Aftercare following joint replacement surgery: Secondary | ICD-10-CM | POA: Diagnosis not present

## 2015-12-14 DIAGNOSIS — Z8614 Personal history of Methicillin resistant Staphylococcus aureus infection: Secondary | ICD-10-CM | POA: Diagnosis not present

## 2015-12-14 DIAGNOSIS — I10 Essential (primary) hypertension: Secondary | ICD-10-CM | POA: Diagnosis not present

## 2015-12-14 MED FILL — PROMETHAZINE 25 MG TABLET: 25 | 5 days supply | Qty: 20 | Fill #0

## 2015-12-15 DIAGNOSIS — Z7901 Long term (current) use of anticoagulants: Secondary | ICD-10-CM | POA: Diagnosis not present

## 2015-12-15 DIAGNOSIS — I358 Other nonrheumatic aortic valve disorders: Secondary | ICD-10-CM | POA: Diagnosis not present

## 2015-12-15 DIAGNOSIS — Z96652 Presence of left artificial knee joint: Secondary | ICD-10-CM | POA: Diagnosis not present

## 2015-12-15 DIAGNOSIS — Z87891 Personal history of nicotine dependence: Secondary | ICD-10-CM | POA: Diagnosis not present

## 2015-12-15 DIAGNOSIS — Z471 Aftercare following joint replacement surgery: Secondary | ICD-10-CM | POA: Diagnosis not present

## 2015-12-15 DIAGNOSIS — M1990 Unspecified osteoarthritis, unspecified site: Secondary | ICD-10-CM | POA: Diagnosis not present

## 2015-12-15 DIAGNOSIS — Z8614 Personal history of Methicillin resistant Staphylococcus aureus infection: Secondary | ICD-10-CM | POA: Diagnosis not present

## 2015-12-15 DIAGNOSIS — M47812 Spondylosis without myelopathy or radiculopathy, cervical region: Secondary | ICD-10-CM | POA: Diagnosis not present

## 2015-12-15 DIAGNOSIS — I10 Essential (primary) hypertension: Secondary | ICD-10-CM | POA: Diagnosis not present

## 2015-12-18 DIAGNOSIS — Z7901 Long term (current) use of anticoagulants: Secondary | ICD-10-CM | POA: Diagnosis not present

## 2015-12-18 DIAGNOSIS — M47812 Spondylosis without myelopathy or radiculopathy, cervical region: Secondary | ICD-10-CM | POA: Diagnosis not present

## 2015-12-18 DIAGNOSIS — I358 Other nonrheumatic aortic valve disorders: Secondary | ICD-10-CM | POA: Diagnosis not present

## 2015-12-18 DIAGNOSIS — Z87891 Personal history of nicotine dependence: Secondary | ICD-10-CM | POA: Diagnosis not present

## 2015-12-18 DIAGNOSIS — Z471 Aftercare following joint replacement surgery: Secondary | ICD-10-CM | POA: Diagnosis not present

## 2015-12-18 DIAGNOSIS — Z8614 Personal history of Methicillin resistant Staphylococcus aureus infection: Secondary | ICD-10-CM | POA: Diagnosis not present

## 2015-12-18 DIAGNOSIS — I10 Essential (primary) hypertension: Secondary | ICD-10-CM | POA: Diagnosis not present

## 2015-12-18 DIAGNOSIS — Z96652 Presence of left artificial knee joint: Secondary | ICD-10-CM | POA: Diagnosis not present

## 2015-12-18 DIAGNOSIS — M1990 Unspecified osteoarthritis, unspecified site: Secondary | ICD-10-CM | POA: Diagnosis not present

## 2015-12-20 DIAGNOSIS — Z96652 Presence of left artificial knee joint: Secondary | ICD-10-CM | POA: Diagnosis not present

## 2015-12-20 DIAGNOSIS — Z8614 Personal history of Methicillin resistant Staphylococcus aureus infection: Secondary | ICD-10-CM | POA: Diagnosis not present

## 2015-12-20 DIAGNOSIS — I358 Other nonrheumatic aortic valve disorders: Secondary | ICD-10-CM | POA: Diagnosis not present

## 2015-12-20 DIAGNOSIS — M1712 Unilateral primary osteoarthritis, left knee: Secondary | ICD-10-CM | POA: Diagnosis not present

## 2015-12-20 DIAGNOSIS — Z87891 Personal history of nicotine dependence: Secondary | ICD-10-CM | POA: Diagnosis not present

## 2015-12-20 DIAGNOSIS — M47812 Spondylosis without myelopathy or radiculopathy, cervical region: Secondary | ICD-10-CM | POA: Diagnosis not present

## 2015-12-20 DIAGNOSIS — I10 Essential (primary) hypertension: Secondary | ICD-10-CM | POA: Diagnosis not present

## 2015-12-20 DIAGNOSIS — Z7901 Long term (current) use of anticoagulants: Secondary | ICD-10-CM | POA: Diagnosis not present

## 2015-12-20 DIAGNOSIS — M1990 Unspecified osteoarthritis, unspecified site: Secondary | ICD-10-CM | POA: Diagnosis not present

## 2015-12-20 DIAGNOSIS — Z471 Aftercare following joint replacement surgery: Secondary | ICD-10-CM | POA: Diagnosis not present

## 2015-12-22 DIAGNOSIS — I358 Other nonrheumatic aortic valve disorders: Secondary | ICD-10-CM | POA: Diagnosis not present

## 2015-12-22 DIAGNOSIS — Z87891 Personal history of nicotine dependence: Secondary | ICD-10-CM | POA: Diagnosis not present

## 2015-12-22 DIAGNOSIS — Z96652 Presence of left artificial knee joint: Secondary | ICD-10-CM | POA: Diagnosis not present

## 2015-12-22 DIAGNOSIS — Z7901 Long term (current) use of anticoagulants: Secondary | ICD-10-CM | POA: Diagnosis not present

## 2015-12-22 DIAGNOSIS — Z8614 Personal history of Methicillin resistant Staphylococcus aureus infection: Secondary | ICD-10-CM | POA: Diagnosis not present

## 2015-12-22 DIAGNOSIS — M1990 Unspecified osteoarthritis, unspecified site: Secondary | ICD-10-CM | POA: Diagnosis not present

## 2015-12-22 DIAGNOSIS — M47812 Spondylosis without myelopathy or radiculopathy, cervical region: Secondary | ICD-10-CM | POA: Diagnosis not present

## 2015-12-22 DIAGNOSIS — I10 Essential (primary) hypertension: Secondary | ICD-10-CM | POA: Diagnosis not present

## 2015-12-22 DIAGNOSIS — Z471 Aftercare following joint replacement surgery: Secondary | ICD-10-CM | POA: Diagnosis not present

## 2015-12-26 DIAGNOSIS — Z7901 Long term (current) use of anticoagulants: Secondary | ICD-10-CM | POA: Diagnosis not present

## 2015-12-26 DIAGNOSIS — Z8614 Personal history of Methicillin resistant Staphylococcus aureus infection: Secondary | ICD-10-CM | POA: Diagnosis not present

## 2015-12-26 DIAGNOSIS — Z471 Aftercare following joint replacement surgery: Secondary | ICD-10-CM | POA: Diagnosis not present

## 2015-12-26 DIAGNOSIS — Z87891 Personal history of nicotine dependence: Secondary | ICD-10-CM | POA: Diagnosis not present

## 2015-12-26 DIAGNOSIS — M1990 Unspecified osteoarthritis, unspecified site: Secondary | ICD-10-CM | POA: Diagnosis not present

## 2015-12-26 DIAGNOSIS — Z96652 Presence of left artificial knee joint: Secondary | ICD-10-CM | POA: Diagnosis not present

## 2015-12-26 DIAGNOSIS — I10 Essential (primary) hypertension: Secondary | ICD-10-CM | POA: Diagnosis not present

## 2015-12-26 DIAGNOSIS — M47812 Spondylosis without myelopathy or radiculopathy, cervical region: Secondary | ICD-10-CM | POA: Diagnosis not present

## 2015-12-26 DIAGNOSIS — I358 Other nonrheumatic aortic valve disorders: Secondary | ICD-10-CM | POA: Diagnosis not present

## 2015-12-26 MED FILL — oxyCODONE HCL 5 MG TABS: 5 | 5 days supply | Qty: 30 | Fill #0

## 2015-12-27 DIAGNOSIS — M25562 Pain in left knee: Secondary | ICD-10-CM | POA: Diagnosis not present

## 2015-12-27 DIAGNOSIS — Z96652 Presence of left artificial knee joint: Secondary | ICD-10-CM | POA: Diagnosis not present

## 2015-12-27 DIAGNOSIS — M25662 Stiffness of left knee, not elsewhere classified: Secondary | ICD-10-CM | POA: Diagnosis not present

## 2015-12-27 DIAGNOSIS — R262 Difficulty in walking, not elsewhere classified: Secondary | ICD-10-CM | POA: Diagnosis not present

## 2015-12-29 DIAGNOSIS — R262 Difficulty in walking, not elsewhere classified: Secondary | ICD-10-CM | POA: Diagnosis not present

## 2015-12-29 DIAGNOSIS — Z96652 Presence of left artificial knee joint: Secondary | ICD-10-CM | POA: Diagnosis not present

## 2015-12-29 DIAGNOSIS — M25662 Stiffness of left knee, not elsewhere classified: Secondary | ICD-10-CM | POA: Diagnosis not present

## 2015-12-29 DIAGNOSIS — M25562 Pain in left knee: Secondary | ICD-10-CM | POA: Diagnosis not present

## 2016-01-01 DIAGNOSIS — Z96652 Presence of left artificial knee joint: Secondary | ICD-10-CM | POA: Diagnosis not present

## 2016-01-01 DIAGNOSIS — R262 Difficulty in walking, not elsewhere classified: Secondary | ICD-10-CM | POA: Diagnosis not present

## 2016-01-01 DIAGNOSIS — M25562 Pain in left knee: Secondary | ICD-10-CM | POA: Diagnosis not present

## 2016-01-01 DIAGNOSIS — M25662 Stiffness of left knee, not elsewhere classified: Secondary | ICD-10-CM | POA: Diagnosis not present

## 2016-01-03 DIAGNOSIS — R262 Difficulty in walking, not elsewhere classified: Secondary | ICD-10-CM | POA: Diagnosis not present

## 2016-01-03 DIAGNOSIS — M25662 Stiffness of left knee, not elsewhere classified: Secondary | ICD-10-CM | POA: Diagnosis not present

## 2016-01-03 DIAGNOSIS — Z96652 Presence of left artificial knee joint: Secondary | ICD-10-CM | POA: Diagnosis not present

## 2016-01-03 DIAGNOSIS — M25562 Pain in left knee: Secondary | ICD-10-CM | POA: Diagnosis not present

## 2016-01-03 MED FILL — PROMETHAZINE 25 MG TABLET: 25 | 5 days supply | Qty: 20 | Fill #0

## 2016-01-05 DIAGNOSIS — M25562 Pain in left knee: Secondary | ICD-10-CM | POA: Diagnosis not present

## 2016-01-05 DIAGNOSIS — M25662 Stiffness of left knee, not elsewhere classified: Secondary | ICD-10-CM | POA: Diagnosis not present

## 2016-01-05 DIAGNOSIS — Z96652 Presence of left artificial knee joint: Secondary | ICD-10-CM | POA: Diagnosis not present

## 2016-01-05 DIAGNOSIS — R262 Difficulty in walking, not elsewhere classified: Secondary | ICD-10-CM | POA: Diagnosis not present

## 2016-01-08 DIAGNOSIS — M25612 Stiffness of left shoulder, not elsewhere classified: Secondary | ICD-10-CM | POA: Diagnosis not present

## 2016-01-08 DIAGNOSIS — M25562 Pain in left knee: Secondary | ICD-10-CM | POA: Diagnosis not present

## 2016-01-08 DIAGNOSIS — M7542 Impingement syndrome of left shoulder: Secondary | ICD-10-CM | POA: Diagnosis not present

## 2016-01-08 DIAGNOSIS — M6281 Muscle weakness (generalized): Secondary | ICD-10-CM | POA: Diagnosis not present

## 2016-01-09 MED FILL — CELECOXIB 200 MG CAPSULE: 200 | 30 days supply | Qty: 30 | Fill #1

## 2016-01-10 DIAGNOSIS — M25662 Stiffness of left knee, not elsewhere classified: Secondary | ICD-10-CM | POA: Diagnosis not present

## 2016-01-10 DIAGNOSIS — Z96652 Presence of left artificial knee joint: Secondary | ICD-10-CM | POA: Diagnosis not present

## 2016-01-10 DIAGNOSIS — M25562 Pain in left knee: Secondary | ICD-10-CM | POA: Diagnosis not present

## 2016-01-10 DIAGNOSIS — R262 Difficulty in walking, not elsewhere classified: Secondary | ICD-10-CM | POA: Diagnosis not present

## 2016-01-10 MED FILL — oxyCODONE HCL 5 MG TABS: 5 | 5 days supply | Qty: 30 | Fill #0

## 2016-01-12 DIAGNOSIS — M25562 Pain in left knee: Secondary | ICD-10-CM | POA: Diagnosis not present

## 2016-01-12 DIAGNOSIS — R262 Difficulty in walking, not elsewhere classified: Secondary | ICD-10-CM | POA: Diagnosis not present

## 2016-01-12 DIAGNOSIS — Z96652 Presence of left artificial knee joint: Secondary | ICD-10-CM | POA: Diagnosis not present

## 2016-01-12 DIAGNOSIS — M25662 Stiffness of left knee, not elsewhere classified: Secondary | ICD-10-CM | POA: Diagnosis not present

## 2016-01-15 DIAGNOSIS — Z96652 Presence of left artificial knee joint: Secondary | ICD-10-CM | POA: Diagnosis not present

## 2016-01-15 DIAGNOSIS — R262 Difficulty in walking, not elsewhere classified: Secondary | ICD-10-CM | POA: Diagnosis not present

## 2016-01-15 DIAGNOSIS — M25662 Stiffness of left knee, not elsewhere classified: Secondary | ICD-10-CM | POA: Diagnosis not present

## 2016-01-15 DIAGNOSIS — M25562 Pain in left knee: Secondary | ICD-10-CM | POA: Diagnosis not present

## 2016-01-17 DIAGNOSIS — M25662 Stiffness of left knee, not elsewhere classified: Secondary | ICD-10-CM | POA: Diagnosis not present

## 2016-01-17 DIAGNOSIS — R262 Difficulty in walking, not elsewhere classified: Secondary | ICD-10-CM | POA: Diagnosis not present

## 2016-01-17 DIAGNOSIS — Z96652 Presence of left artificial knee joint: Secondary | ICD-10-CM | POA: Diagnosis not present

## 2016-01-17 DIAGNOSIS — M25562 Pain in left knee: Secondary | ICD-10-CM | POA: Diagnosis not present

## 2016-01-19 DIAGNOSIS — M25562 Pain in left knee: Secondary | ICD-10-CM | POA: Diagnosis not present

## 2016-01-19 DIAGNOSIS — Z96652 Presence of left artificial knee joint: Secondary | ICD-10-CM | POA: Diagnosis not present

## 2016-01-19 DIAGNOSIS — M25662 Stiffness of left knee, not elsewhere classified: Secondary | ICD-10-CM | POA: Diagnosis not present

## 2016-01-19 DIAGNOSIS — R262 Difficulty in walking, not elsewhere classified: Secondary | ICD-10-CM | POA: Diagnosis not present

## 2016-01-19 MED FILL — LOSARTAN POTASSIUM 100 MG T: 100 | 30 days supply | Qty: 30 | Fill #0

## 2016-01-23 DIAGNOSIS — Z96652 Presence of left artificial knee joint: Secondary | ICD-10-CM | POA: Diagnosis not present

## 2016-01-24 DIAGNOSIS — Z96652 Presence of left artificial knee joint: Secondary | ICD-10-CM | POA: Diagnosis not present

## 2016-01-24 DIAGNOSIS — M25562 Pain in left knee: Secondary | ICD-10-CM | POA: Diagnosis not present

## 2016-01-24 DIAGNOSIS — M25662 Stiffness of left knee, not elsewhere classified: Secondary | ICD-10-CM | POA: Diagnosis not present

## 2016-01-24 DIAGNOSIS — R262 Difficulty in walking, not elsewhere classified: Secondary | ICD-10-CM | POA: Diagnosis not present

## 2016-01-29 MED FILL — CYCLOBENZAPRINE 10 MG TAB: 10 | 10 days supply | Qty: 30 | Fill #0

## 2016-01-31 DIAGNOSIS — R262 Difficulty in walking, not elsewhere classified: Secondary | ICD-10-CM | POA: Diagnosis not present

## 2016-01-31 DIAGNOSIS — Z96652 Presence of left artificial knee joint: Secondary | ICD-10-CM | POA: Diagnosis not present

## 2016-01-31 DIAGNOSIS — M25562 Pain in left knee: Secondary | ICD-10-CM | POA: Diagnosis not present

## 2016-01-31 DIAGNOSIS — M25662 Stiffness of left knee, not elsewhere classified: Secondary | ICD-10-CM | POA: Diagnosis not present

## 2016-02-05 DIAGNOSIS — Z96652 Presence of left artificial knee joint: Secondary | ICD-10-CM | POA: Diagnosis not present

## 2016-02-05 DIAGNOSIS — M25662 Stiffness of left knee, not elsewhere classified: Secondary | ICD-10-CM | POA: Diagnosis not present

## 2016-02-05 DIAGNOSIS — R262 Difficulty in walking, not elsewhere classified: Secondary | ICD-10-CM | POA: Diagnosis not present

## 2016-02-05 DIAGNOSIS — M25562 Pain in left knee: Secondary | ICD-10-CM | POA: Diagnosis not present

## 2016-02-06 MED FILL — CELECOXIB 200 MG CAPSULE: 200 | 30 days supply | Qty: 30 | Fill #2

## 2016-02-07 DIAGNOSIS — I10 Essential (primary) hypertension: Secondary | ICD-10-CM | POA: Diagnosis not present

## 2016-02-07 DIAGNOSIS — F5101 Primary insomnia: Secondary | ICD-10-CM | POA: Diagnosis not present

## 2016-02-07 MED FILL — ZOLPIDEM TARTRATE 5 MG TAB: 5 | 30 days supply | Qty: 30 | Fill #0

## 2016-02-09 DIAGNOSIS — R262 Difficulty in walking, not elsewhere classified: Secondary | ICD-10-CM | POA: Diagnosis not present

## 2016-02-09 DIAGNOSIS — M25662 Stiffness of left knee, not elsewhere classified: Secondary | ICD-10-CM | POA: Diagnosis not present

## 2016-02-09 DIAGNOSIS — M25562 Pain in left knee: Secondary | ICD-10-CM | POA: Diagnosis not present

## 2016-02-09 DIAGNOSIS — Z96652 Presence of left artificial knee joint: Secondary | ICD-10-CM | POA: Diagnosis not present

## 2016-02-12 DIAGNOSIS — M25662 Stiffness of left knee, not elsewhere classified: Secondary | ICD-10-CM | POA: Diagnosis not present

## 2016-02-12 DIAGNOSIS — Z96652 Presence of left artificial knee joint: Secondary | ICD-10-CM | POA: Diagnosis not present

## 2016-02-12 DIAGNOSIS — M25562 Pain in left knee: Secondary | ICD-10-CM | POA: Diagnosis not present

## 2016-02-12 DIAGNOSIS — M1712 Unilateral primary osteoarthritis, left knee: Secondary | ICD-10-CM | POA: Diagnosis not present

## 2016-02-19 MED FILL — LOSARTAN POTASSIUM 100 MG T: 100 | 30 days supply | Qty: 30 | Fill #1

## 2016-02-27 DIAGNOSIS — Z96652 Presence of left artificial knee joint: Secondary | ICD-10-CM | POA: Diagnosis not present

## 2016-02-28 MED FILL — CLINDAMYCIN HCL 300 MG CAPS: 300 | 30 days supply | Qty: 10 | Fill #0

## 2016-03-06 MED FILL — ZOLPIDEM TARTRATE 10 MG TAB: 10 | 30 days supply | Qty: 30 | Fill #0

## 2016-03-19 MED FILL — LOSARTAN POTASSIUM 100 MG T: 100 | 90 days supply | Qty: 90 | Fill #2

## 2016-05-23 ENCOUNTER — Ambulatory Visit: Payer: 59 | Admitting: Obstetrics and Gynecology

## 2016-05-30 DIAGNOSIS — Z96652 Presence of left artificial knee joint: Secondary | ICD-10-CM | POA: Diagnosis not present

## 2016-05-30 DIAGNOSIS — M65311 Trigger thumb, right thumb: Secondary | ICD-10-CM | POA: Diagnosis not present

## 2016-06-13 MED FILL — LOSARTAN POTASSIUM 100 MG T: 100 | 30 days supply | Qty: 30 | Fill #3

## 2016-07-12 ENCOUNTER — Telehealth: Payer: Self-pay | Admitting: Interventional Cardiology

## 2016-07-12 NOTE — Telephone Encounter (Signed)
The pt had an Echo on 06/23/2014 with results of : Normal LV function. Mild AI. Improved LV function compared to prior study.   Is it ok to order Echo? Please advise.

## 2016-07-12 NOTE — Telephone Encounter (Signed)
New Message  Pt voiced she is needing echocardiogram prior to her appt on 11.17.17.  Please f/u with pt

## 2016-07-12 NOTE — Telephone Encounter (Signed)
If she is feeling well, it may not be necessary to order echo.  If she is having SHOB or decreased exercise tolerance, ok to order echo.

## 2016-07-12 NOTE — Telephone Encounter (Signed)
Pt denies SOB, decreased exercise tolerance, dizziness or any other possible cardiac issues.  Pt ok with not doing an echo at this time.  Advised pt if any of these symptoms begin to occur prior to her appt with Dr. Irish Lack, just to give [place] a call and we can get her scheduled for an echo.  Pt appreciative for all assistance.

## 2016-08-01 MED FILL — LOSARTAN POTASSIUM 100 MG T: 100 | 30 days supply | Qty: 30 | Fill #0

## 2016-08-09 ENCOUNTER — Encounter: Payer: Self-pay | Admitting: Obstetrics and Gynecology

## 2016-08-09 ENCOUNTER — Ambulatory Visit (INDEPENDENT_AMBULATORY_CARE_PROVIDER_SITE_OTHER): Payer: 59 | Admitting: Obstetrics and Gynecology

## 2016-08-09 VITALS — BP 138/82 | HR 76 | Resp 16 | Ht 63.5 in | Wt 173.6 lb

## 2016-08-09 DIAGNOSIS — Z0000 Encounter for general adult medical examination without abnormal findings: Secondary | ICD-10-CM

## 2016-08-09 DIAGNOSIS — M8580 Other specified disorders of bone density and structure, unspecified site: Secondary | ICD-10-CM

## 2016-08-09 DIAGNOSIS — Z01419 Encounter for gynecological examination (general) (routine) without abnormal findings: Secondary | ICD-10-CM

## 2016-08-09 DIAGNOSIS — R17 Unspecified jaundice: Secondary | ICD-10-CM

## 2016-08-09 DIAGNOSIS — Z1151 Encounter for screening for human papillomavirus (HPV): Secondary | ICD-10-CM | POA: Diagnosis not present

## 2016-08-09 DIAGNOSIS — Z113 Encounter for screening for infections with a predominantly sexual mode of transmission: Secondary | ICD-10-CM | POA: Diagnosis not present

## 2016-08-09 HISTORY — DX: Unspecified jaundice: R17

## 2016-08-09 LAB — LIPID PANEL
CHOLESTEROL: 198 mg/dL (ref 125–200)
HDL: 65 mg/dL (ref >=46)
LDL Cholesterol: 111 mg/dL (ref <130)
TRIGLYCERIDES: 111 mg/dL (ref <150)
Total CHOL/HDL Ratio: 3 Ratio (ref <=5.0)
VLDL: 22 mg/dL (ref <30)

## 2016-08-09 LAB — COMPREHENSIVE METABOLIC PANEL
ALBUMIN: 4.4 g/dL (ref 3.6–5.1)
ALK PHOS: 73 U/L (ref 33–130)
ALT: 21 U/L (ref 6–29)
AST: 19 U/L (ref 10–35)
BUN: 13 mg/dL (ref 7–25)
CALCIUM: 10 mg/dL (ref 8.6–10.4)
CHLORIDE: 102 mmol/L (ref 98–110)
CO2: 28 mmol/L (ref 20–31)
Creat: 0.82 mg/dL (ref 0.50–0.99)
Glucose, Bld: 84 mg/dL (ref 65–99)
POTASSIUM: 3.8 mmol/L (ref 3.5–5.3)
Sodium: 140 mmol/L (ref 135–146)
TOTAL PROTEIN: 7.3 g/dL (ref 6.1–8.1)
Total Bilirubin: 1.4 mg/dL — ABNORMAL HIGH (ref 0.2–1.2)

## 2016-08-09 LAB — POCT URINALYSIS DIPSTICK
Bilirubin, UA: NEGATIVE
GLUCOSE UA: NEGATIVE
Ketones, UA: NEGATIVE
Leukocytes, UA: NEGATIVE
NITRITE UA: NEGATIVE
PH UA: 5
PROTEIN UA: NEGATIVE
RBC UA: NEGATIVE
UROBILINOGEN UA: NEGATIVE

## 2016-08-09 LAB — CBC
HEMATOCRIT: 40.3 % (ref 35.0–45.0)
HEMOGLOBIN: 13.6 g/dL (ref 11.7–15.5)
MCH: 31.2 pg (ref 27.0–33.0)
MCHC: 33.7 g/dL (ref 32.0–36.0)
MCV: 92.4 fL (ref 80.0–100.0)
MPV: 9.7 fL (ref 7.5–12.5)
Platelets: 275 10*3/uL (ref 140–400)
RBC: 4.36 MIL/uL (ref 3.80–5.10)
RDW: 13.6 % (ref 11.0–15.0)
WBC: 6.6 10*3/uL (ref 3.8–10.8)

## 2016-08-09 LAB — TSH: TSH: 1.7 m[IU]/L

## 2016-08-09 NOTE — Patient Instructions (Signed)

## 2016-08-09 NOTE — Progress Notes (Signed)
65 y.o. [id number] Single Caucasian female here for annual exam.    Doing some splinting for BMs.  Feels off center.  Some stress incontinence.   Off HRT and doing well.   Has aortic insufficiency.   Had a left knee replacement.   PCP:  Carol Ada, MD  Patient's last menstrual period was 10/21/1992.         Sexually active: No. female The current method of family planning is post menopausal status.    Exercising: No.   Smoker:  former  Health Maintenance: Pap:  05-08-15 Neg:Pos HR HPV History of abnormal Pap:  Yes,Pap smear 05-08-15 normal:Pos HR HPV--colposcopy revealing ECC neg and Exocervix LGSIL.  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 MMG:  11-03-15 3D/Density B/Neg/BiRads1:[facility] Colonoscopy:  2012 diverticulosis/nl with Dr. Teena Irani. Next one due 2022.  BMD:  03-24-13  Result:Osteopenia:[facility].  T score -1.2 of hip and spine.  TDaP:  03/2013--up to date through work Gardasil:   N/A HIV:  today Hep C:  today Screening Labs:   Urine today: Neg   reports that she quit smoking about 23 years ago. Her smoking use included Cigarettes. She has a 10.00 pack-year smoking history. She has never used smokeless tobacco. She reports that she drinks about 3.0 oz of alcohol per week . She reports that she does not use drugs.  Past Medical History:  Diagnosis Date  . Abnormal Pap smear of cervix    09/2013 Ascus:Pos HR HPV, 03/2014 Ascus:Pos HR HPV;colpo 05/2014 LGSIL of cx and VIN 1of perianal region  . Aortic valve disorders   . Arthritis   . Diverticulosis   . Essential hypertension, benign   . Fibroid    posterior  . Herpes genitalis in women   . History of MRSA infection 07/2015   culture positive [facility]  . Hypertension   . Primary localized osteoarthritis of left knee   . Rectocele   . Urinary incontinence     Past Surgical History:  Procedure Laterality Date  .  arthroscopic knee Left 2001  . BILATERAL OOPHORECTOMY  2010   Dr. Quincy Simmonds  . CARPAL TUNNEL RELEASE Bilateral   . COLONOSCOPY    . DIAGNOSTIC LAPAROSCOPY    . PELVIC LAPAROSCOPY     x2--ectopics  . TOTAL KNEE ARTHROPLASTY Left 12/11/2015   Procedure: LEFT TOTAL KNEE ARTHROPLASTY;  Surgeon: Elsie Saas, MD;  Location: [place];  Service: Orthopedics;  Laterality: Left;    Current Outpatient Prescriptions  Medication Sig Dispense Refill  . clobetasol cream (TEMOVATE) [id number] % Apply 1 application topically daily as needed.   1  . losartan (COZAAR) 100 MG tablet Take 100 mg by mouth daily.    . mupirocin ointment (BACTROBAN) 2 % Apply 1 application topically daily as needed (for skin).   5   No current facility-administered medications for this visit.     Family History  Problem Relation Age of Onset  . Asthma Mother   . Congestive Heart Failure Father   . Multiple sclerosis Father     ROS:  Pertinent items are noted in HPI.  Otherwise, a comprehensive ROS was negative.  Exam:   BP 138/82 (BP Location: Right Arm, Patient Position: Sitting, Cuff Size: Normal)   Pulse 76   Resp 16   Ht 5' 3.5" (1.613 m)   Wt 173 lb 9.6 oz (78.7 kg)   LMP 10/21/1992  BMI 30.27 kg/m     General appearance: alert, cooperative and appears stated age Head: Normocephalic, without obvious abnormality, atraumatic Neck: no adenopathy, supple, symmetrical, trachea midline and thyroid normal to inspection and palpation Lungs: clear to auscultation bilaterally Breasts: normal appearance, no masses or tenderness, No nipple retraction or dimpling, No nipple discharge or bleeding, No axillary or supraclavicular adenopathy Heart: regular rate and rhythm Abdomen: soft, non-tender; no masses, no organomegaly Extremities: extremities normal, atraumatic, no cyanosis or edema Skin: Skin color, texture, turgor normal. No rashes or lesions Lymph nodes: Cervical, supraclavicular, and axillary nodes normal. No abnormal  inguinal nodes palpated Neurologic: Grossly normal  Pelvic: External genitalia:  no lesions              Urethra:  normal appearing urethra with no masses, tenderness or lesions              Bartholins and Skenes: normal                 Vagina: normal appearing vagina with normal color and discharge, no lesions.  Almost first degree cystocele and rectocele. Feels like a perineal defect.               Cervix: no lesions              Pap taken: Yes.   Bimanual Exam:  Uterus:  normal size, contour, position, consistency, mobility, non-tender              Adnexa: no mass, fullness, tenderness              Rectal exam: Yes.  .  Confirms.              Anus:  normal sphincter tone, no lesions  Chaperone was present for exam.  Assessment:   Well woman visit with normal exam. LGSIL and VIN I. Osteopenia.  Mild cystocele and rectocele. Mild GSI. STD screening.   Plan: Yearly mammogram recommended after age 21.  Recommended self breast exam.  Pap and HR HPV as above. Discussed Calcium, Vitamin D, regular exercise program including cardiovascular and weight bearing exercise. Routine labs and STD screening.  BMD at time of mammogram in Jan. 2018.  Avoid heavy lifting and straining.  Observation for now for prolapse and mild incontinence.  Follow up annually and prn.      After visit summary provided.

## 2016-08-10 LAB — VITAMIN D 25 HYDROXY (VIT D DEFICIENCY, FRACTURES): VIT D 25 HYDROXY: 34 ng/mL (ref 30–100)

## 2016-08-10 LAB — HEPATITIS C ANTIBODY: HCV Ab: NEGATIVE

## 2016-08-10 LAB — STD PANEL
HEP B S AG: NEGATIVE
HIV: NONREACTIVE

## 2016-08-11 NOTE — Addendum Note (Signed)
Addended by: Yisroel Ramming, Dietrich Pates E on: 08/11/2016 06:14 PM   Modules accepted: Orders

## 2016-08-14 ENCOUNTER — Encounter: Payer: Self-pay | Admitting: Obstetrics and Gynecology

## 2016-08-14 LAB — IPS N GONORRHOEA AND CHLAMYDIA BY PCR

## 2016-08-15 LAB — IPS PAP TEST WITH HPV

## 2016-08-16 ENCOUNTER — Telehealth: Payer: Self-pay | Admitting: *Deleted

## 2016-08-16 DIAGNOSIS — R87810 Cervical high risk human papillomavirus (HPV) DNA test positive: Secondary | ICD-10-CM

## 2016-08-16 NOTE — Telephone Encounter (Signed)
-----   Message from Nunzio Cobbs, MD sent at 08/15/2016  7:35 PM EDT ----- Results to patient through My Chart. Please contact patient in follow up to schedule a colposcopy with me  She had a colposcopy last year for the same pap reading.  This will need precert.   Hi Cate,   I am sharing results of your pap with you directly.  The cytology is normal, but the high risk HPV testing is positive.  (This is what the pap and HPV showed last year as well.) By protocol, it is time for another colposcopy.   The office will contact you to schedule this appointment.   Sorry that we need to do this procedure again!  [place] Marisa Sprinkles

## 2016-08-16 NOTE — Telephone Encounter (Signed)
Spoke with patient. Advised of result message as seen below. Patient scheduled for colposcopy 10/04/16 at 10:00 am with Dr. Quincy Simmonds.  Advised to take Motrin 800 mg with food and water one hour before procedure. Patient verbalizes understanding and is agreeable.  Order placed for colposcopy   Cc: Theresia Lo

## 2016-08-16 NOTE — Telephone Encounter (Signed)
Left message to call Jvion Turgeon at [phone].  

## 2016-08-22 ENCOUNTER — Encounter: Payer: Self-pay | Admitting: Interventional Cardiology

## 2016-09-02 DIAGNOSIS — I10 Essential (primary) hypertension: Secondary | ICD-10-CM | POA: Diagnosis not present

## 2016-09-02 MED FILL — LOSARTAN POTASSIUM 100 MG T: 100 | 90 days supply | Qty: 90 | Fill #0

## 2016-09-05 NOTE — Progress Notes (Signed)
Patient ID: Rebecca Terry, female   DOB: 12-28-1950, 65 y.o.   MRN: [id number]     Cardiology Office Note   Date:  09/06/2016   ID:  Rebecca Terry, DOB 04-30-1951, MRN [id number]  PCP:  Reginia Naas, MD    No chief complaint on file. f/u AI   Wt Readings from Last 3 Encounters:  09/06/16 79.1 kg (174 lb 6.4 oz)  08/09/16 78.7 kg (173 lb 9.6 oz)  11/01/15 74.8 kg (165 lb)       History of Present Illness: Rebecca Terry is a 65 y.o. female   with HTN and aortic insufficiency. SHe has been feeling very well. Had one trip to ER.  See below. Aortic Insufficiency:  Walks up to 2 miles daily but less recently. Swimming 4-5x/week during the summer, but none recently.  Denies : Chest pain.  Shortness of breath.  Palpitations.  Syncope.   Had an episode of unilateral facial swelling and went to [facility] ER.  THere was a question of angioedema.  Lisinopril was changed to losartan.  BPs up to 145.  Losartan was increased to 50 mg daily recently.  There was a question of whether this was early Bells palsy.  Sx resolved before she hd to take Prednisone.  She will be trying to wean of the PremPro for hot flashes.  She had a TKR in 2/17.  She did well with surgery.   Past Medical History:  Diagnosis Date  . Abnormal Pap smear of cervix    09/2013 Ascus:Pos HR HPV, 03/2014 Ascus:Pos HR HPV;colpo 05/2014 LGSIL of cx and VIN 1of perianal region  . Aortic valve disorders   . Arthritis   . Diverticulosis   . Elevated bilirubin 08/09/2016   level 1.4  . Essential hypertension, benign   . Fibroid    posterior  . Herpes genitalis in women   . History of MRSA infection 07/2015   culture [facility]  . Hypertension   . Primary localized osteoarthritis of left knee   . Rectocele   . Urinary incontinence     Past Surgical History:  Procedure Laterality Date  . arthroscopic knee Left 2001  . BILATERAL OOPHORECTOMY  2010   Dr. Quincy Simmonds  . CARPAL TUNNEL RELEASE  Bilateral   . COLONOSCOPY    . DIAGNOSTIC LAPAROSCOPY    . PELVIC LAPAROSCOPY     x2--ectopics  . TOTAL KNEE ARTHROPLASTY Left 12/11/2015   Procedure: LEFT TOTAL KNEE ARTHROPLASTY;  Surgeon: Elsie Saas, MD;  Location: [place];  Service: Orthopedics;  Laterality: Left;     Current Outpatient Prescriptions  Medication Sig Dispense Refill  . clobetasol cream (TEMOVATE) [id number] % Apply 1 application topically daily as needed.   1  . losartan (COZAAR) 100 MG tablet Take 100 mg by mouth daily.     No current facility-administered medications for this visit.     Allergies:   Lisinopril; Adhesive [tape]; Hibiclens [chlorhexidine gluconate]; Latex; Nickel; and Penicillins    Social History:  The patient  reports that she quit smoking about 23 years ago. Her smoking use included Cigarettes. She has a 10.00 pack-year smoking history. She has never used smokeless tobacco. She reports that she drinks about 3.0 oz of alcohol per week . She reports that she does not use drugs.   Family History:  The patient's family history includes Asthma in her mother; Congestive Heart Failure in her father; Heart attack in her father; Multiple sclerosis in her father.  ROS:  Please see the history of present illness.   Otherwise, review of systems are positive for episode of facial numbness as noted above.   All other systems are reviewed and negative.    PHYSICAL EXAM: VS:  BP (!) 160/90   Pulse 72   Ht 5' 3.5" (1.613 m)   Wt 79.1 kg (174 lb 6.4 oz)   LMP 10/21/1992   BMI 30.41 kg/m  , BMI Body mass index is 30.41 kg/m. GEN: Well nourished, well developed, in no acute distress  HEENT: normal  Neck: no JVD, carotid bruits, or masses Cardiac: RRR; 1/6 diastolic murmur, no rubs, or gallops,no edema  Respiratory:  clear to auscultation bilaterally, normal work of breathing GI: soft, nontender, nondistended, + BS MS: no deformity or atrophy  Skin: warm and dry, no rash Neuro:  Strength and sensation are  intact Psych: euthymic mood, full affect   EKG:   The ekg ordered today demonstrates NSR, nonspecific ST segment changes, no change from prior   Recent Labs: 08/09/2016: ALT 21; BUN 13; Creat 0.82; Hemoglobin 13.6; Platelets 275; Potassium 3.8; Sodium 140; TSH 1.70   Lipid Panel    Component Value Date/Time   CHOL 198 08/09/2016 1500   TRIG 111 08/09/2016 1500   HDL 65 08/09/2016 1500   CHOLHDL 3.0 08/09/2016 1500   VLDL 22 08/09/2016 1500   LDLCALC 111 08/09/2016 1500     Other studies Reviewed: Additional studies/ records that were reviewed today with results demonstrating: 2015 echo shows normal EF with mild AI.   ASSESSMENT AND PLAN:  1. Aortic insufficiency: No sx of CHF at this time. Echo from 2015 was reviewed. LV function appeared normal. No increase in her AI in 2009. COntinue BP control and risk factor modification. No need for echo at this time. Did well with surgery. 2. HTN: Continue to follow at home.  Continue losartan.  I am not convinced that what she had with lisinopril was truly angioedema. She seems to be tolerating her losartan. Will check electrolytes today given the change in medication.  Now on losartan 100 mg. Her pressures have been slightly higher, in the 140/88 range.  Start HCTZ 12.5 mg daily.  Would increase to 25 mg if BP does not come down.  BMet in a week.  We discussed using amlodipine instead, but will try diuretic option first.  3. Slightly elevated triglycerides of July 2016. She has cut back on exercise post TKR. Hopefully with exercise and better diet control, her lipids will improve.  If they stay elevated, could consider fish oil.  TG improved on last check in 10/17.   Current medicines are reviewed at length with the patient today.  The patient concerns regarding her medicines were addressed.  The following changes have been made:  No change  Labs/ tests ordered today include:   No orders of the defined types were placed in this  encounter.   Recommend 150 minutes/week of aerobic exercise Low fat, low carb, high fiber diet recommended  Disposition:   FU in 1 year   Signed, Larae Grooms, MD  09/06/2016 8:46 AM    [facility] [address], [place], [place]  [address] Phone: [phone]; Fax: [phone]

## 2016-09-06 ENCOUNTER — Encounter: Payer: Self-pay | Admitting: Interventional Cardiology

## 2016-09-06 ENCOUNTER — Ambulatory Visit (INDEPENDENT_AMBULATORY_CARE_PROVIDER_SITE_OTHER): Payer: 59 | Admitting: Interventional Cardiology

## 2016-09-06 VITALS — BP 160/90 | HR 72 | Ht 63.5 in | Wt 174.4 lb

## 2016-09-06 DIAGNOSIS — I10 Essential (primary) hypertension: Secondary | ICD-10-CM

## 2016-09-06 DIAGNOSIS — E781 Pure hyperglyceridemia: Secondary | ICD-10-CM | POA: Diagnosis not present

## 2016-09-06 DIAGNOSIS — I359 Nonrheumatic aortic valve disorder, unspecified: Secondary | ICD-10-CM

## 2016-09-06 MED ORDER — HYDROCHLOROTHIAZIDE 12.5 MG PO CAPS: 12.5000 mg | ORAL_CAPSULE | Freq: Every day | ORAL | 11 refills | Status: DC

## 2016-09-06 MED FILL — HYDROCHLOROTHIAZIDE 12.5 MG: 12.5 | 30 days supply | Qty: 30 | Fill #0

## 2016-09-06 NOTE — Patient Instructions (Signed)
Medication Instructions:  Start taking HCTZ 12.5 mg daily. All other medications remain the same.  Labwork: BMET in 1 week  Testing/Procedures: None  Follow-Up: Your physician wants you to follow-up in: 1 year. You will receive a reminder letter in the mail two months in advance. If you don't receive a letter, please call our office to schedule the follow-up appointment.     If you need a refill on your cardiac medications before your next appointment, please call your pharmacy.

## 2016-09-16 ENCOUNTER — Other Ambulatory Visit: Payer: 59 | Admitting: *Deleted

## 2016-09-16 DIAGNOSIS — I10 Essential (primary) hypertension: Secondary | ICD-10-CM | POA: Diagnosis not present

## 2016-09-16 LAB — BASIC METABOLIC PANEL
BUN: 26 mg/dL — AB (ref 7–25)
CALCIUM: 9.8 mg/dL (ref 8.6–10.4)
CO2: 26 mmol/L (ref 20–31)
CREATININE: 1.02 mg/dL — AB (ref 0.50–0.99)
Chloride: 100 mmol/L (ref 98–110)
Glucose, Bld: 85 mg/dL (ref 65–99)
Potassium: 3.9 mmol/L (ref 3.5–5.3)
Sodium: 137 mmol/L (ref 135–146)

## 2016-09-18 ENCOUNTER — Telehealth: Payer: Self-pay | Admitting: *Deleted

## 2016-09-18 MED ORDER — AMLODIPINE BESYLATE 5 MG PO TABS: 5.0000 mg | ORAL_TABLET | Freq: Every day | ORAL | 3 refills | Status: DC

## 2016-09-18 MED FILL — AMLODIPINE BESYLATE 5 MG TA: 5 | 90 days supply | Qty: 90 | Fill #0

## 2016-09-18 NOTE — Telephone Encounter (Signed)
Pt returned my call.  We went over pts lab results.  Pt has been advised to d/c HCTZ and start Amlodipine 5 mg qd.

## 2016-09-25 ENCOUNTER — Telehealth: Payer: Self-pay | Admitting: Obstetrics and Gynecology

## 2016-09-25 NOTE — Telephone Encounter (Signed)
Called patient to review benefits for procedure. Left voicemail to call back and review. °

## 2016-09-27 NOTE — Telephone Encounter (Signed)
Patient returned call. Aware and agreeable to benefit and cancellation policy. No further questions. Ok to close.

## 2016-10-02 NOTE — Progress Notes (Signed)
Subjective:     Patient ID: Rebecca Terry, female   DOB: Dec 24, 1950, 65 y.o.   MRN: [id number]  HPI  Patient here today for colposcopy. Pap smear 08-09-16 was Neg:Pos HR HPV.  Pap history: 05-08-15 normal:Pos HR HPV--colposcopy revealing ECC neg and Exocervix LGSIL. Pap 04-06-14 Ascus:Pos HR HPV with colposcopy revealing LGSIL of cervix and VIN I of perianal region.  Wants to test her LFTs and BMP today.  Now on Amlodipine for HTN.  Review of Systems  LMP: 10-21-1992 Contraception: Postmenopausal    Objective:   Physical Exam  Genitourinary:          Colposcopy of cervix, vagina and vulva. Consent for procedure.  Speculum placed and 3% acetic acid applied.  Colposcopy satisfactory.  White light and green light filter used.  Minor acetowhite change at 10:00. ECC performed and sent to path. Biopsy at 10:00 performed and sent to path. Monsel's applied.  Minimal EBL.  Speculum removed.   Acetic acid soaked gauze to the vulva.  Perineal lesion noted.  1% lidocaine injected - lot number [id number], expiration 2/21. 4 mm punch biopsy used to remove tissue and send to pathology.  AgNO3 used and then suture of 3/0 Vicryl placed.  Good hemostasis.  No complications.    Assessment:     Positive HR HPV.  Vulvar lesion. Elevated Creatinine, BUN, and LFTs.    Plan:     Biopsies to pathology: 1 - ECC, 2 - Bx 10:00 Aw change, and 3 - Biopsy 4 mm punch of perineum. Instructions/precautions given.  I anticipate cotesting in one year.  Will check CMP today.       After visit summary to patient.

## 2016-10-04 ENCOUNTER — Encounter: Payer: Self-pay | Admitting: Obstetrics and Gynecology

## 2016-10-04 ENCOUNTER — Ambulatory Visit (INDEPENDENT_AMBULATORY_CARE_PROVIDER_SITE_OTHER): Payer: 59 | Admitting: Obstetrics and Gynecology

## 2016-10-04 VITALS — BP 124/80 | HR 84 | Resp 16 | Ht 63.5 in

## 2016-10-04 DIAGNOSIS — A630 Anogenital (venereal) warts: Secondary | ICD-10-CM | POA: Diagnosis not present

## 2016-10-04 DIAGNOSIS — R87810 Cervical high risk human papillomavirus (HPV) DNA test positive: Secondary | ICD-10-CM

## 2016-10-04 DIAGNOSIS — R17 Unspecified jaundice: Secondary | ICD-10-CM

## 2016-10-04 DIAGNOSIS — R7989 Other specified abnormal findings of blood chemistry: Secondary | ICD-10-CM

## 2016-10-04 DIAGNOSIS — N9089 Other specified noninflammatory disorders of vulva and perineum: Secondary | ICD-10-CM

## 2016-10-04 DIAGNOSIS — N870 Mild cervical dysplasia: Secondary | ICD-10-CM | POA: Diagnosis not present

## 2016-10-04 LAB — COMPREHENSIVE METABOLIC PANEL
ALBUMIN: 4.4 g/dL (ref 3.6–5.1)
ALK PHOS: 81 U/L (ref 33–130)
ALT: 21 U/L (ref 6–29)
AST: 21 U/L (ref 10–35)
BILIRUBIN TOTAL: 0.9 mg/dL (ref 0.2–1.2)
BUN: 15 mg/dL (ref 7–25)
CALCIUM: 10 mg/dL (ref 8.6–10.4)
CO2: 29 mmol/L (ref 20–31)
Chloride: 102 mmol/L (ref 98–110)
Creat: 0.65 mg/dL (ref 0.50–0.99)
GLUCOSE: 80 mg/dL (ref 65–99)
POTASSIUM: 4 mmol/L (ref 3.5–5.3)
Sodium: 140 mmol/L (ref 135–146)
TOTAL PROTEIN: 7.4 g/dL (ref 6.1–8.1)

## 2016-10-04 NOTE — Patient Instructions (Addendum)
Colposcopy Post-procedure Instructions . Cramping is common.  You may take Ibuprofen, Aleve, or Tylenol for the cramping.  This should resolve within the next two to three days.   . You may have bright red spotting or blackish discharge for several days after your procedure.  The discharge occurs because of a topical solution used to stop bleeding at the biopsy site(s).  You should wear a mini pad for the next few days. . Refrain from putting anything in the vagina until the bleeding and/or discharge stops (usually less than a week). . You need to call the office if you have any pelvic pain, fever, heavy bleeding, or foul smelling vaginal discharge. . Shower or bathe as normal . You will be notified within one week of your biopsy results or we will discuss your results at your follow-up appointment if needed.  VULVAR BIOPSY POST-PROCEDURE INSTRUCTIONS  1. You may take Ibuprofen, Aleve or Tylenol for pain if needed.    2. You may have a small amount of spotting.  You should wear a mini pad for the next few days.  3. You may use some topical Neosporin ointment if you would like (over the counter is fine).  4. You need to call if you have redness around the biopsy site, if there is any unusual draining, if the bleeding is heavy, or if you are concerned.  5. Shower or bathe as normal  6. We will call you within one week with results or we will discuss the results at your follow-up appointment if needed.

## 2016-10-05 ENCOUNTER — Encounter: Payer: Self-pay | Admitting: Obstetrics and Gynecology

## 2016-10-08 LAB — IPS OTHER TISSUE BIOPSY

## 2016-10-10 ENCOUNTER — Telehealth: Payer: Self-pay

## 2016-10-10 NOTE — Telephone Encounter (Signed)
-----   Message from Nunzio Cobbs, MD sent at 10/09/2016  7:48 PM EST ----- Results to patient through My Chart. Please confirm that the patient has seen the results and understands the plan for pap and HR HPV in one year.  Recall - 08.  Hi Rebecca Terry,   I am releasing your colposcopy results directly to you for your review and records.  Your cervical biopsy confirmed CIN I, and the vulvar biopsy confirmed VIN I (condyloma).   If we decide to do a LEEP someday for persistent dysplasia, it will need to be very thoughtful, because this can cause scarring of the cervix and make future colposcopy more difficult. LEEP would not remove the HPV. I think that I may have excised the entire condyloma region with the perineal biopsy.   Please make an appointment if the suture becomes bothersome and you wish for removal.  It should just fall out.   I am recommending a follow up pap and high risk HPV testing in one year.   I will have the nurse call to be sure you were able to see these results and recommendations.   Joyful Holidays!  [place] Rebecca Terry

## 2016-10-10 NOTE — Telephone Encounter (Signed)
Spoke with patient. Patient states she has reviewed her MyChart message. Does not have any further questions. Verbalizes understanding of recommended follow up pap and high risk HPV screening in a year. Aex scheduled for 08/20/2017. 08 recall placed.  Routing to provider for final review. Patient agreeable to disposition. Will close encounter.

## 2016-10-21 DIAGNOSIS — M8580 Other specified disorders of bone density and structure, unspecified site: Secondary | ICD-10-CM

## 2016-10-21 HISTORY — DX: Other specified disorders of bone density and structure, unspecified site: M85.80

## 2016-10-22 ENCOUNTER — Other Ambulatory Visit: Payer: Self-pay | Admitting: Obstetrics and Gynecology

## 2016-10-22 DIAGNOSIS — Z1231 Encounter for screening mammogram for malignant neoplasm of breast: Secondary | ICD-10-CM

## 2016-11-15 ENCOUNTER — Ambulatory Visit
Admission: RE | Admit: 2016-11-15 | Discharge: 2016-11-15 | Disposition: A | Discharge status: Home / Self Care | Payer: 59 | Source: Ambulatory Visit | Attending: Obstetrics and Gynecology | Admitting: Obstetrics and Gynecology

## 2016-11-15 DIAGNOSIS — M65342 Trigger finger, left ring finger: Secondary | ICD-10-CM | POA: Diagnosis not present

## 2016-11-15 DIAGNOSIS — M65341 Trigger finger, right ring finger: Secondary | ICD-10-CM | POA: Diagnosis not present

## 2016-11-15 DIAGNOSIS — Z780 Asymptomatic menopausal state: Secondary | ICD-10-CM | POA: Diagnosis not present

## 2016-11-15 DIAGNOSIS — M65311 Trigger thumb, right thumb: Secondary | ICD-10-CM | POA: Diagnosis not present

## 2016-11-15 DIAGNOSIS — Z1231 Encounter for screening mammogram for malignant neoplasm of breast: Secondary | ICD-10-CM

## 2016-11-15 DIAGNOSIS — M8580 Other specified disorders of bone density and structure, unspecified site: Secondary | ICD-10-CM

## 2016-11-15 DIAGNOSIS — M79642 Pain in left hand: Secondary | ICD-10-CM | POA: Diagnosis not present

## 2016-11-15 DIAGNOSIS — M8589 Other specified disorders of bone density and structure, multiple sites: Secondary | ICD-10-CM | POA: Diagnosis not present

## 2016-11-15 DIAGNOSIS — M79641 Pain in right hand: Secondary | ICD-10-CM | POA: Diagnosis not present

## 2016-11-16 ENCOUNTER — Encounter: Payer: Self-pay | Admitting: Obstetrics and Gynecology

## 2016-11-18 ENCOUNTER — Encounter: Payer: Self-pay | Admitting: Obstetrics and Gynecology

## 2016-11-18 ENCOUNTER — Telehealth: Payer: Self-pay | Admitting: *Deleted

## 2016-11-18 ENCOUNTER — Other Ambulatory Visit: Payer: Self-pay | Admitting: Obstetrics and Gynecology

## 2016-11-18 ENCOUNTER — Ambulatory Visit (INDEPENDENT_AMBULATORY_CARE_PROVIDER_SITE_OTHER): Payer: 59 | Admitting: Obstetrics and Gynecology

## 2016-11-18 VITALS — BP 118/72 | HR 80 | Resp 16 | Ht 63.5 in | Wt 170.0 lb

## 2016-11-18 DIAGNOSIS — N644 Mastodynia: Secondary | ICD-10-CM

## 2016-11-18 NOTE — Progress Notes (Signed)
GYNECOLOGY  VISIT   HPI: 66 y.o.   Single  Caucasian  female   [phone] with Patient's last menstrual period was 10/21/1992.   here for   Right breast pain.  Did self breast exam and found tenderness on the right breast. She went for mammogram and they asked for a dx study.  We asked for her to come in for evaluation.   Pain wanders and is deep and superficial.  No mass.   No trauma.  Did in use her arm for retraction in a surgical case prior to onset of the pain.  No insect bites.  No HRT at this time.   GYNECOLOGIC HISTORY: Patient's last menstrual period was 10/21/1992. Contraception:  Post menopausal  Menopausal hormone therapy:  none Last mammogram:  11/03/15 BI-RADS Negative Last pap smear:   08-09-16 was Neg:Pos HR HPV. Colpo done 10/04/16 confirmed CIN I, and the vulvar biopsy confirmed VIN I (condyloma).         OB History    Gravida Para Term Preterm AB Living   4 1 1   2 1    SAB TAB Ectopic Multiple Live Births     1 1             Patient Active Problem List   Diagnosis Date Noted  . Primary localized osteoarthritis of left knee 11/27/2015  . Gilbert's syndrome   . Herpes genitalis in women   . Hypertriglyceridemia 08/24/2015  . History of MRSA infection 07/22/2015  . Aortic valve disorder   . Essential hypertension, benign   . 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  . Osteoarthritis of neck 10/28/2013  . Pain in joint, shoulder region 10/13/2013  . Hallux rigidus of right foot 10/13/2013  . Hammertoe 10/13/2013  . LATERAL EPICONDYLITIS, RIGHT 10/17/2008  . ARM PAIN, RIGHT 10/17/2008    Past Medical History:  Diagnosis Date  . 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 Aortic valve disorders   . Arthritis   . Diverticulosis   . Elevated bilirubin 08/09/2016   level 1.4  . Essential hypertension, benign    . Fibroid    posterior  . Herpes genitalis in women   . History of MRSA infection 07/2015   culture positive [facility]  . Hypertension   . Osteopenia 2018   hip and spine  . Primary localized osteoarthritis of left knee   . Rectocele   . Urinary incontinence     Past Surgical History:  Procedure Laterality Date  . arthroscopic knee Left 2001  . BILATERAL OOPHORECTOMY  2010   Dr. Quincy Simmonds  . CARPAL TUNNEL RELEASE Bilateral   . COLONOSCOPY    . DIAGNOSTIC LAPAROSCOPY    . PELVIC LAPAROSCOPY     x2--ectopics  . TOTAL KNEE ARTHROPLASTY Left 12/11/2015   Procedure: LEFT TOTAL KNEE ARTHROPLASTY;  Surgeon: Elsie Saas, MD;  Location: [facility];  Service: Orthopedics;  Laterality: Left;    Current Outpatient Prescriptions  Medication Sig Dispense Refill  . amLODipine (NORVASC) 5 MG tablet Take 1 tablet (5 mg total) by mouth daily. 90 tablet 3  . clobetasol cream (TEMOVATE) [id number] % Apply 1 application topically daily as needed.   1  . losartan (COZAAR) 100 MG tablet Take 100 mg by mouth daily.     No current facility-administered medications for this visit.  ALLERGIES: Lisinopril; Adhesive [tape]; Hibiclens [chlorhexidine gluconate]; Latex; Nickel; and Penicillins  Family History  Problem Relation Age of Onset  . Asthma Mother   . Congestive Heart Failure Father   . Multiple sclerosis Father   . Heart attack Father     Social History   Social History  . Marital status: Single    Spouse name: N/A  . Number of children: N/A  . Years of education: N/A   Occupational History  . Not on file.   Social History Main Topics  . Smoking status: Former Smoker    Packs/day: 0.50    Years: 20.00    Types: Cigarettes    Quit date: 04/07/1993  . Smokeless tobacco: Never Used  . Alcohol use 3.0 oz/week    5 Standard drinks or equivalent per week     Comment: 5 glasses wine/beer per week  . Drug use: No  . Sexual activity: No   Other Topics Concern  . Not on file    Social History Narrative  . No narrative on file    ROS:  Pertinent items are noted in HPI.  PHYSICAL EXAMINATION:    BP 118/72 (BP Location: Right Arm, Patient Position: Sitting, Cuff Size: Normal)   Pulse 80   Resp 16   Ht 5' 3.5" (1.613 m)   Wt 170 lb (77.1 kg)   LMP 10/21/1992   BMI 29.64 kg/m     General appearance: alert, cooperative and appears stated age   Breasts: normal appearance, no masses or tenderness of left breast.  Has tenderness or right breast at  3- 4:00 and no mass.  No nipple retraction or dimpling, No nipple discharge or bleeding, No axillary or supraclavicular adenopathy   Chaperone was present for exam.  ASSESSMENT  Right breast pain.  No mass. Fibrocystic change?  PLAN  Plan for bilateral dx mammogram and right breast ultrasound at [facility].  Ibuprofen prn breast pain.  Follow up prn.   An After Visit Summary was printed and given to the patient.  __15____ minutes face to face time of which over 50% was spent in counseling.

## 2016-11-18 NOTE — Telephone Encounter (Signed)
Left message to call Sharee Pimple at [phone].  Needs OV with Dr. Quincy Simmonds for evaluation of right breast pain.

## 2016-11-18 NOTE — Progress Notes (Signed)
Scheduled patient while in office for bilateral diagnostic mammogram with right breast ultrasound at the [facility] on 11/29/2016 at 9 am. Patient is agreeable to date and time. Placed in mammogram hold.

## 2016-11-18 NOTE — Telephone Encounter (Signed)
Spoke with patient. Advised need to schedule OV for  breast exam with Dr. Quincy Simmonds for evaluation prior to scheduling MMG. Patient scheduled for 11/18/16 at 2pm with Dr. Quincy Simmonds. Patient verbalizes understanding and is agreeable to date and time.  Routing to provider for final review. Patient is agreeable to disposition. Will close encounter.

## 2016-11-25 MED FILL — LOSARTAN POTASSIUM 100 MG T: 100 | 90 days supply | Qty: 90 | Fill #1

## 2016-11-29 ENCOUNTER — Ambulatory Visit
Admission: RE | Admit: 2016-11-29 | Discharge: 2016-11-29 | Disposition: A | Discharge status: Home / Self Care | Payer: 59 | Source: Ambulatory Visit | Attending: Obstetrics and Gynecology | Admitting: Obstetrics and Gynecology

## 2016-11-29 DIAGNOSIS — N644 Mastodynia: Secondary | ICD-10-CM | POA: Diagnosis not present

## 2016-11-29 DIAGNOSIS — R922 Inconclusive mammogram: Secondary | ICD-10-CM | POA: Diagnosis not present

## 2016-12-02 MED FILL — SULFAMETHOXAZOLE/TMP DS TAB: 800-160 | 9 days supply | Qty: 18 | Fill #0

## 2016-12-07 DIAGNOSIS — M65311 Trigger thumb, right thumb: Secondary | ICD-10-CM | POA: Diagnosis not present

## 2016-12-16 MED FILL — AMLODIPINE BESYLATE 5 MG TA: 5 | 90 days supply | Qty: 90 | Fill #1

## 2017-01-01 DIAGNOSIS — M25562 Pain in left knee: Secondary | ICD-10-CM | POA: Diagnosis not present

## 2017-01-10 DIAGNOSIS — H0289 Other specified disorders of eyelid: Secondary | ICD-10-CM | POA: Diagnosis not present

## 2017-01-10 MED FILL — NEO/POLY/DEXAMET EYE OINT: 3.5-10000-0 | 5 days supply | Qty: 4 | Fill #0

## 2017-01-27 ENCOUNTER — Telehealth: Payer: Self-pay | Admitting: Interventional Cardiology

## 2017-01-27 NOTE — Telephone Encounter (Signed)
Informed pt of recommendations per Dr. Irish Lack.  Pt verbalized understanding and was in agreement with this plan.

## 2017-01-27 NOTE — Telephone Encounter (Signed)
Can she try compression stockings when she is on her feet. Leg elevation at night will help also.   If we decrease the amlodipine, her BP may increase.  Swelling is more common at the higher dose of amlodipine (10 mg).  This could just be from her being on her feet all day.

## 2017-01-27 NOTE — Telephone Encounter (Signed)
Pt states she has been having swelling in her ankles x 2 wks.  States she really noticed it when taking her compression stockings off after work and there being an indention.  Denies SOB, lightheadedness or dizziness.  Not on a fluid pill currently.  Takes Amlodipine 5mg  QD and feels like this is likely causing swelling.  Advised this is a side effect of that particular med.  Pt states legs do not swell enough to cause any discomfort.  BP has been 130-133/70-80, HR 80.  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  Advised I will send message to Dr. Irish Lack for review and advisement.

## 2017-01-27 NOTE — Telephone Encounter (Signed)
New message    Pt c/o swelling: STAT is pt has developed SOB within 24 hours  1. How long have you been experiencing swelling? Couple of weeks  2. Where is the swelling located? Both Ankles  3.  Are you currently taking a "fluid pill"? No  4.  Are you currently SOB? No  5.  Have you traveled recently? No

## 2017-02-26 DIAGNOSIS — K648 Other hemorrhoids: Secondary | ICD-10-CM | POA: Diagnosis not present

## 2017-02-26 MED FILL — ANUSOL-HC 25 MG SUPPOSITORY: 25 | 10 days supply | Qty: 30 | Fill #0

## 2017-03-03 ENCOUNTER — Telehealth: Payer: Self-pay | Admitting: Interventional Cardiology

## 2017-03-03 NOTE — Telephone Encounter (Signed)
OK to stop amlodipine, but may need to increase losartan if BP increases.

## 2017-03-03 NOTE — Telephone Encounter (Signed)
New Message:   Please call,concerning her Amlodipine and her swelling.

## 2017-03-03 NOTE — Telephone Encounter (Signed)
Spoke with pt and she states that she is still having swelling in lower extremities.  She wears compression hose during the day.  States she feels like her hands are swelling at night.  BP today was 114/73.  About 3 weeks ago pr began cutting her Losartan in half on her own.  Pt would like to switch off of Amlodipine if Dr. Irish Lack is willing.  Advised I would send message for review and advisement.

## 2017-03-04 NOTE — Telephone Encounter (Signed)
Called patient and made her aware that Dr. Irish Lack states that she can discontinue the amlodipine. Patient advised to monitor her BP and let us know if it becomes elevated. Patient states that she will stop the amlodipine and take the losartan 100mg  QD as prescribed and will monitor her BP and let us know if it consistently elevated. Patient thanked me for the call.

## 2017-03-05 ENCOUNTER — Telehealth: Payer: Self-pay | Admitting: Interventional Cardiology

## 2017-03-05 NOTE — Telephone Encounter (Signed)
New Message:    Pt wants to know if she should wean off of her Amlodipine,if so how should she do it?

## 2017-03-05 NOTE — Telephone Encounter (Signed)
Called patient and made her aware that she can discontinue the amlodipine. Patient advised to monitor her BP and let us know if it becomes consistenly elevated. Patient verbalized understanding and thanked me for the call.

## 2017-03-07 MED FILL — LOSARTAN POTASSIUM 100 MG T: 100 | 90 days supply | Qty: 90 | Fill #2

## 2017-03-12 MED FILL — metroNIDAZOLE 500 MG TABS: 500 | 10 days supply | Qty: 30 | Fill #0

## 2017-03-12 MED FILL — CIPROFLOXACIN HCL 500 MG TA: 500 | 10 days supply | Qty: 20 | Fill #0

## 2017-03-28 DIAGNOSIS — R509 Fever, unspecified: Secondary | ICD-10-CM | POA: Diagnosis not present

## 2017-03-28 MED FILL — OSELTAMIVIR PHOSPHATE 75 MG: 75 | 5 days supply | Qty: 10 | Fill #0

## 2017-04-01 ENCOUNTER — Ambulatory Visit (INDEPENDENT_AMBULATORY_CARE_PROVIDER_SITE_OTHER): Payer: 59

## 2017-04-01 ENCOUNTER — Telehealth: Payer: Self-pay | Admitting: Obstetrics & Gynecology

## 2017-04-01 VITALS — BP 138/76 | HR 84 | Temp 98.0°F | Resp 20 | Ht 63.5 in | Wt 172.8 lb

## 2017-04-01 DIAGNOSIS — R350 Frequency of micturition: Secondary | ICD-10-CM

## 2017-04-01 LAB — POCT URINALYSIS DIPSTICK
BILIRUBIN UA: NEGATIVE
Glucose, UA: NEGATIVE
KETONES UA: NEGATIVE
Nitrite, UA: NEGATIVE
PH UA: 5 (ref 5.0–8.0)
Urobilinogen, UA: 0.2 E.U./dL

## 2017-04-01 MED ORDER — NITROFURANTOIN MONOHYD MACRO 100 MG PO CAPS: 100.0000 mg | ORAL_CAPSULE | Freq: Two times a day (BID) | ORAL | 0 refills | Status: DC

## 2017-04-01 MED FILL — NITROFURANTOIN MONO-MCR 100: 100 | 3 days supply | Qty: 6 | Fill #0

## 2017-04-01 NOTE — Telephone Encounter (Signed)
Pt communicated with me in the OR today as she is working.  Having low grade temp, increased urinary with urgency and frequency.  Cannot leave work until after 3:30pm.  Requested to come by the office and leave a urine sample.  Rx for Macrobid 100mg  bid x 3 days sent to pharmacy and urine culture and micro orders placed.  Routing to Dr. Quincy Simmonds.

## 2017-04-01 NOTE — Progress Notes (Signed)
Patient here for urinary frequency. Patient stated ran low grade fever and had chills over weekend.  Feels ok today, but still has frequency.   Per Dr. Sabra Heck, prescription for Macrobid BID for 3 days has been sent into pharmacy on file.   U/A w/reflex to Culture has been ordered and urine been sent.   Routed to provider and encounter closed.

## 2017-04-02 LAB — MICROSCOPIC EXAMINATION: Casts: NONE SEEN /lpf

## 2017-04-02 LAB — UA/M W/RFLX CULTURE, ROUTINE
Bilirubin, UA: NEGATIVE
GLUCOSE, UA: NEGATIVE
LEUKOCYTES UA: NEGATIVE
Nitrite, UA: NEGATIVE
PROTEIN UA: NEGATIVE
SPEC GRAV UA: 1.022 (ref 1.005–1.030)
Urobilinogen, Ur: 0.2 mg/dL (ref 0.2–1.0)
pH, UA: 5 (ref 5.0–7.5)

## 2017-04-03 ENCOUNTER — Telehealth: Payer: Self-pay | Admitting: Obstetrics and Gynecology

## 2017-04-03 NOTE — Telephone Encounter (Signed)
Spoke with patient. Advised of message and results as seen below from [place]. Patient states she is feeling much better today since being on Macrobid. Reports she was not feeling well yesterday, but "Today I feel like a new person." She will continue to monitor her symptoms and if symptoms return or develops new symptoms will see PCP.  Routing to provider for final review. Patient agreeable to disposition. Will close encounter.

## 2017-04-03 NOTE — Telephone Encounter (Signed)
Patient is returning a call to Kaitlyn. °

## 2017-04-03 NOTE — Addendum Note (Signed)
Addended by: Zoila Shutter D on: 04/03/2017 04:48 PM   Modules accepted: Orders

## 2017-04-03 NOTE — Telephone Encounter (Signed)
Patient is requesting lab result.

## 2017-04-03 NOTE — Telephone Encounter (Signed)
Routing to Dr.Silva for review of results from 04/01/2017.

## 2017-04-03 NOTE — Telephone Encounter (Signed)
FYI, UC was added by Dr. Sabra Heck.

## 2017-04-03 NOTE — Telephone Encounter (Signed)
The urine dip was positive for trace ketones and trace RBCs.  The urine micro was unremarkable - no increased WBCs or RBCs.  Mucous was present and few bacteria. This was ordered as reflex to culture.  Do to the negative nature of the micro, a culture was not performed.   How is she doing following the course of antibiotics?

## 2017-04-03 NOTE — Telephone Encounter (Signed)
Left message to call Kaitlyn at [phone]. 

## 2017-04-05 LAB — SPECIMEN STATUS REPORT

## 2017-04-21 LAB — URINE CULTURE

## 2017-05-12 DIAGNOSIS — M79641 Pain in right hand: Secondary | ICD-10-CM | POA: Diagnosis not present

## 2017-05-12 DIAGNOSIS — M65311 Trigger thumb, right thumb: Secondary | ICD-10-CM | POA: Diagnosis not present

## 2017-05-12 DIAGNOSIS — M65342 Trigger finger, left ring finger: Secondary | ICD-10-CM | POA: Diagnosis not present

## 2017-05-12 DIAGNOSIS — M65341 Trigger finger, right ring finger: Secondary | ICD-10-CM | POA: Diagnosis not present

## 2017-05-12 MED FILL — METHYLPREDNISOLONE 4 MG TAB: 4 | 6 days supply | Qty: 21 | Fill #0

## 2017-06-20 DIAGNOSIS — I10 Essential (primary) hypertension: Secondary | ICD-10-CM | POA: Diagnosis not present

## 2017-06-20 MED FILL — HYDROCHLOROTHIAZIDE 25 MG T: 25 | 30 days supply | Qty: 30 | Fill #0

## 2017-06-20 MED FILL — LOSARTAN POTASSIUM 100 MG T: 100 | 90 days supply | Qty: 90 | Fill #0

## 2017-07-15 MED FILL — HYDROCHLOROTHIAZIDE 25 MG T: 25 | 90 days supply | Qty: 90 | Fill #0

## 2017-08-19 ENCOUNTER — Other Ambulatory Visit (HOSPITAL_COMMUNITY): Payer: Self-pay | Admitting: Family Medicine

## 2017-08-19 DIAGNOSIS — M79605 Pain in left leg: Secondary | ICD-10-CM

## 2017-08-19 DIAGNOSIS — M7989 Other specified soft tissue disorders: Principal | ICD-10-CM

## 2017-08-20 ENCOUNTER — Ambulatory Visit (HOSPITAL_COMMUNITY)
Admission: RE | Admit: 2017-08-20 | Discharge: 2017-08-20 | Disposition: A | Discharge status: Home / Self Care | Payer: 59 | Source: Ambulatory Visit | Attending: Family Medicine | Admitting: Family Medicine

## 2017-08-20 ENCOUNTER — Ambulatory Visit: Payer: 59 | Admitting: Obstetrics and Gynecology

## 2017-08-20 DIAGNOSIS — R2242 Localized swelling, mass and lump, left lower limb: Secondary | ICD-10-CM | POA: Diagnosis not present

## 2017-08-20 DIAGNOSIS — M79605 Pain in left leg: Secondary | ICD-10-CM | POA: Diagnosis not present

## 2017-08-20 DIAGNOSIS — M7989 Other specified soft tissue disorders: Secondary | ICD-10-CM | POA: Diagnosis not present

## 2017-08-29 ENCOUNTER — Encounter: Payer: Self-pay | Admitting: Interventional Cardiology

## 2017-09-09 ENCOUNTER — Ambulatory Visit: Payer: 59 | Admitting: Interventional Cardiology

## 2017-09-09 ENCOUNTER — Encounter: Payer: Self-pay | Admitting: Interventional Cardiology

## 2017-09-09 VITALS — BP 124/76 | HR 73 | Ht 63.5 in | Wt 165.8 lb

## 2017-09-09 DIAGNOSIS — I359 Nonrheumatic aortic valve disorder, unspecified: Secondary | ICD-10-CM | POA: Diagnosis not present

## 2017-09-09 DIAGNOSIS — I10 Essential (primary) hypertension: Secondary | ICD-10-CM | POA: Diagnosis not present

## 2017-09-09 NOTE — Progress Notes (Signed)
Cardiology Office Note   Date:  09/09/2017   ID:  Rebecca Terry, DOB Sep 21, 1951, MRN [id number]  PCP:  Carol Ada, MD    No chief complaint on file. Aortic Insufficiency, HTN   Wt Readings from Last 3 Encounters:  09/09/17 165 lb 12.8 oz (75.2 kg)  04/01/17 172 lb 12.8 oz (78.4 kg)  11/18/16 170 lb (77.1 kg)       History of Present Illness: Rebecca Terry is a 66 y.o. female  with HTN and aortic insufficiency. BP has been well controlled.    Readings at home < 140/80.  Denies : Chest pain. Dizziness. Leg edema. Nitroglycerin use. Orthopnea. Palpitations. Paroxysmal nocturnal dyspnea. Shortness of breath. Syncope.   Past Medical History:  Diagnosis Date  . 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 Aortic valve disorders   . Arthritis   . Diverticulosis   . Elevated bilirubin 08/09/2016   level 1.4  . Essential hypertension, benign   . Fibroid    posterior  . Herpes genitalis in women   . History of MRSA infection 07/2015   culture positive [facility]  . Hypertension   . Osteopenia 2018   hip and spine  . Primary localized osteoarthritis of left knee   . Rectocele   . Urinary incontinence     Past Surgical History:  Procedure Laterality Date  . arthroscopic knee Left 2001  . BILATERAL OOPHORECTOMY  2010   Dr. Quincy Simmonds  . CARPAL TUNNEL RELEASE Bilateral   . COLONOSCOPY    . DIAGNOSTIC LAPAROSCOPY    . PELVIC LAPAROSCOPY     x2--ectopics  . TOTAL KNEE ARTHROPLASTY Left 12/11/2015   Procedure: LEFT TOTAL KNEE ARTHROPLASTY;  Surgeon: Elsie Saas, MD;  Location: [place];  Service: Orthopedics;  Laterality: Left;     Current Outpatient Medications  Medication Sig Dispense Refill  . clobetasol cream (TEMOVATE) 2.84 % Apply 1 application topically daily as needed.   1  . hydrochlorothiazide (HYDRODIURIL) 25 MG tablet Take  25 mg by mouth daily.  1  . losartan (COZAAR) 100 MG tablet Take 100 mg by mouth daily.     No current facility-administered medications for this visit.     Allergies:   Lisinopril; Adhesive [tape]; Hibiclens [chlorhexidine gluconate]; Latex; Nickel; and Penicillins    Social History:  The patient  reports that she quit smoking about 24 years ago. Her smoking use included cigarettes. She has a 10.00 pack-year smoking history. she has never used smokeless tobacco. She reports that she drinks about 3.0 oz of alcohol per week. She reports that she does not use drugs.   Family History:  The patient's family history includes Asthma in her mother; Congestive Heart Failure in her father; Heart attack in her father; Multiple sclerosis in her father.    ROS:  Please see the history of present illness.   Otherwise, review of systems are positive for intentional weight loss.   All other systems are reviewed and negative.    PHYSICAL EXAM: VS:  BP 124/76   Pulse 73   Ht 5' 3.5" (1.613 m)   Wt 165 lb 12.8 oz (75.2 kg)   LMP 10/21/1992   SpO2 95%   BMI 28.91 kg/m  , BMI Body mass index is 28.91 kg/m. GEN: Well nourished, well developed, in no acute distress  HEENT: normal  Neck: no JVD, carotid bruits, or masses Cardiac: RRR; soft diastolic murmur, rubs, or gallops,no edema  Respiratory:  clear to auscultation bilaterally, normal work of breathing GI: soft, nontender, nondistended, + BS MS: no deformity or atrophy  Skin: warm and dry, no rash Neuro:  Strength and sensation are intact Psych: euthymic mood, full affect   EKG:   The ekg ordered today demonstrates NSR, nonspecific ST changes   Recent Labs: 10/04/2016: ALT 21; BUN 15; Creat 0.65; Potassium 4.0; Sodium 140   Lipid Panel    Component Value Date/Time   CHOL 198 08/09/2016 1500   TRIG 111 08/09/2016 1500   HDL 65 08/09/2016 1500   CHOLHDL 3.0 08/09/2016 1500   VLDL 22 08/09/2016 1500   LDLCALC 111 08/09/2016 1500       Other studies Reviewed: Additional studies/ records that were reviewed today with results demonstrating: 2015 echo reviewed, normal LV function; mild valvular regurg.  LDL 130 in August 2018.  Creatinine 0.83, potassium 4.0   ASSESSMENT AND PLAN:  1. AI: Mild by exam.  No CHF sx. COntinue BP control.   2. HTN: Controlled.  Continue losartan and HCTZ. 3. Continue healthy diet and increase exercise to continue with intentional weight loss.   Current medicines are reviewed at length with the patient today.  The patient concerns regarding her medicines were addressed.  The following changes have been made:  No change  Labs/ tests ordered today include:  No orders of the defined types were placed in this encounter.   Recommend 150 minutes/week of aerobic exercise Low fat, low carb, high fiber diet recommended  Disposition:   FU in 1 year   Signed, Larae Grooms, MD  09/09/2017 4:01 PM    [facility] [place], [place], [place]  [address] Phone: [phone]; Fax: [phone]

## 2017-09-09 NOTE — Patient Instructions (Signed)

## 2017-09-15 MED FILL — LOSARTAN POTASSIUM 100 MG T: 100 | 90 days supply | Qty: 90 | Fill #1

## 2017-09-18 DIAGNOSIS — H2513 Age-related nuclear cataract, bilateral: Secondary | ICD-10-CM | POA: Diagnosis not present

## 2017-09-18 DIAGNOSIS — H5203 Hypermetropia, bilateral: Secondary | ICD-10-CM | POA: Diagnosis not present

## 2017-09-18 DIAGNOSIS — H04123 Dry eye syndrome of bilateral lacrimal glands: Secondary | ICD-10-CM | POA: Diagnosis not present

## 2017-09-18 DIAGNOSIS — H43813 Vitreous degeneration, bilateral: Secondary | ICD-10-CM | POA: Diagnosis not present

## 2017-10-10 MED FILL — HYDROCHLOROTHIAZIDE 25 MG T: 25 | 90 days supply | Qty: 90 | Fill #1

## 2017-10-16 ENCOUNTER — Other Ambulatory Visit: Payer: Self-pay | Admitting: Obstetrics and Gynecology

## 2017-10-16 DIAGNOSIS — Z1231 Encounter for screening mammogram for malignant neoplasm of breast: Secondary | ICD-10-CM

## 2017-11-21 ENCOUNTER — Ambulatory Visit: Payer: 59 | Admitting: Obstetrics and Gynecology

## 2017-12-08 ENCOUNTER — Telehealth: Payer: Self-pay | Admitting: *Deleted

## 2017-12-08 NOTE — Telephone Encounter (Signed)
Patient in 08 recall for 09/2017. Patient cancelled 11-21-17 AEX and rescheduled for 02-20-18. Is this date ok? Please advise.  Thanks

## 2017-12-08 NOTE — Telephone Encounter (Signed)
I am happy to work with the patient to try to see her sooner.  If she is unable to do this, then keep the May appointment! Thank you.

## 2017-12-10 NOTE — Telephone Encounter (Signed)
Patient returning call.  Ok to leave a detailed message.

## 2017-12-10 NOTE — Telephone Encounter (Signed)
Extended recall to 12/2017 -eh

## 2017-12-10 NOTE — Telephone Encounter (Signed)
Patient moved appt up to 01/05/18

## 2017-12-10 NOTE — Telephone Encounter (Signed)
Left message to call regarding R/S -eh

## 2017-12-10 NOTE — Telephone Encounter (Signed)
Left a detailed message regarding scheduling/ recall Rebecca Terry

## 2017-12-12 ENCOUNTER — Ambulatory Visit
Admission: RE | Admit: 2017-12-12 | Discharge: 2017-12-12 | Disposition: A | Discharge status: Home / Self Care | Payer: 59 | Source: Ambulatory Visit | Attending: Obstetrics and Gynecology | Admitting: Obstetrics and Gynecology

## 2017-12-12 DIAGNOSIS — Z1231 Encounter for screening mammogram for malignant neoplasm of breast: Secondary | ICD-10-CM

## 2017-12-23 DIAGNOSIS — L309 Dermatitis, unspecified: Secondary | ICD-10-CM | POA: Diagnosis not present

## 2017-12-23 DIAGNOSIS — I10 Essential (primary) hypertension: Secondary | ICD-10-CM | POA: Diagnosis not present

## 2017-12-23 MED FILL — CLOBETASOL PROPIONATE 0.05: 0.05 | 35 days supply | Qty: 30 | Fill #0

## 2017-12-23 MED FILL — LOSARTAN POTASSIUM 100 MG T: 100 | 90 days supply | Qty: 90 | Fill #0

## 2018-01-05 ENCOUNTER — Encounter: Payer: Self-pay | Admitting: Obstetrics and Gynecology

## 2018-01-05 ENCOUNTER — Ambulatory Visit (INDEPENDENT_AMBULATORY_CARE_PROVIDER_SITE_OTHER): Payer: 59 | Admitting: Obstetrics and Gynecology

## 2018-01-05 ENCOUNTER — Other Ambulatory Visit: Payer: Self-pay

## 2018-01-05 ENCOUNTER — Other Ambulatory Visit (HOSPITAL_COMMUNITY)
Admission: RE | Admit: 2018-01-05 | Discharge: 2018-01-05 | Disposition: A | Discharge status: Home / Self Care | Payer: 59 | Source: Ambulatory Visit | Attending: Obstetrics and Gynecology | Admitting: Obstetrics and Gynecology

## 2018-01-05 VITALS — BP 152/74 | HR 72 | Resp 16 | Ht 63.25 in | Wt 167.0 lb

## 2018-01-05 DIAGNOSIS — Z01419 Encounter for gynecological examination (general) (routine) without abnormal findings: Secondary | ICD-10-CM

## 2018-01-05 NOTE — Progress Notes (Signed)
67 y.o. [id number] Single Caucasian female here for annual exam.    Frequent urination.  No leakage for no reason.  Declines medication.   Loss of urine with cough/sneeze.  Known rectocele.  Magnesium helps.   No bleeding or spotting.   Planning on losing weight.   Wants routine labs.  Declines STD testing.   Does not refill of Vaginal estrogen cream.   PCP: Foye Deer, MD  Patient's last menstrual period was 10/21/1992.           Sexually active: No.  The current method of family planning is post menopausal status.    Exercising: No.  The patient does not participate in regular exercise at present. Smoker:  no  Health Maintenance: Pap:  08/09/16 Neg:Pos HR HPV; 10/04/16 cervical biopsy confirmed CIN I, and the vulvar biopsy confirmed VIN I (condyloma) History of abnormal Pap:  Yes, 06/08/15 Pap smear Exocervix bx - LGSIL, ECC negative; 05/08/15 Neg:Pos HR HPV; Pap smear 05-08-15 normal:Pos HR HPV--colposcopy revealing ECC neg and Exocervix LGSIL. History of pap 04-06-14 Ascus:Pos HR HPV with colposcopy revealing LGSIL of cervix and VIN I of perianal region MMG:  12/12/17 BIRADS 1 negative/density b Colonoscopy:  2012 diverticulosis/nl with Dr. Teena Irani. Next one due 2022 BMD:   11/15/16  Result  Osteopenia TDaP:  03/2013 Gardasil:   n/a HIV and Hep C: 08/09/16 Negative Screening Labs:  Discuss today -- patient is fasting   reports that she quit smoking about 24 years ago. Her smoking use included cigarettes. She has a 10.00 pack-year smoking history. she has never used smokeless tobacco. She reports that she drinks about 3.0 oz of alcohol per week. She reports that she does not use drugs.  Past Medical History:  Diagnosis Date  . 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 Aortic valve disorders   . Arthritis   . Diverticulosis   . Elevated bilirubin  08/09/2016   level 1.4  . Essential hypertension, benign   . Fibroid    posterior  . Herpes genitalis in women   . History of MRSA infection 07/2015   culture positive [facility]  . Hypertension   . Osteopenia 2018   hip and spine  . Primary localized osteoarthritis of left knee   . Rectocele   . Urinary incontinence     Past Surgical History:  Procedure Laterality Date  . arthroscopic knee Left 2001  . BILATERAL OOPHORECTOMY  2010   Dr. Quincy Simmonds  . CARPAL TUNNEL RELEASE Bilateral   . COLONOSCOPY    . DIAGNOSTIC LAPAROSCOPY    . PELVIC LAPAROSCOPY     x2--ectopics  . TOTAL KNEE ARTHROPLASTY Left 12/11/2015   Procedure: LEFT TOTAL KNEE ARTHROPLASTY;  Surgeon: Elsie Saas, MD;  Location: [place];  Service: Orthopedics;  Laterality: Left;    Current Outpatient Medications  Medication Sig Dispense Refill  . clobetasol cream (TEMOVATE) 6.29 % Apply 1 application topically daily as needed.   1  . hydrochlorothiazide (HYDRODIURIL) 25 MG tablet Take 25 mg by mouth daily.  1  . losartan (COZAAR) 100 MG tablet Take 100 mg by mouth daily.    . Magnesium 400 MG TABS Take 1 tablet by mouth daily.    . Multiple Vitamin (MULTIVITAMIN) tablet Take 1 tablet by mouth daily.    . TURMERIC PO Take 1 tablet by mouth daily.  No current facility-administered medications for this visit.     Family History  Problem Relation Age of Onset  . Asthma Mother   . Congestive Heart Failure Father   . Multiple sclerosis Father   . Heart attack Father     ROS:  Pertinent items are noted in HPI.  Otherwise, a comprehensive ROS was negative.  Exam:   BP (!) 152/74 (BP Location: Right Arm, Patient Position: Sitting, Cuff Size: Normal)   Pulse 72   Resp 16   Ht 5' 3.25" (1.607 m)   Wt 167 lb (75.8 kg)   LMP 10/21/1992   BMI 29.35 kg/m     General appearance: alert, cooperative and appears stated age Head: Normocephalic, without obvious abnormality, atraumatic Neck: no adenopathy, supple,  symmetrical, trachea midline and thyroid normal to inspection and palpation Lungs: clear to auscultation bilaterally Breasts: normal appearance, no masses or tenderness, No nipple retraction or dimpling, No nipple discharge or bleeding, No axillary or supraclavicular adenopathy Heart: regular rate and rhythm Abdomen: soft, non-tender; no masses, no organomegaly Extremities: extremities normal, atraumatic, no cyanosis or edema Skin: Skin color, texture, turgor normal. No rashes or lesions Lymph nodes: Cervical, supraclavicular, and axillary nodes normal. No abnormal inguinal nodes palpated Neurologic: Grossly normal  Pelvic: External genitalia:  no lesions              Urethra:  normal appearing urethra with no masses, tenderness or lesions              Bartholins and Skenes: normal                 Vagina: normal appearing vagina with normal color and discharge, no lesions.  Minimal cystocele and rectocele.              Cervix: no lesions              Pap taken: Yes.   Bimanual Exam:  Uterus:  normal size, contour, position, consistency, mobility, non-tender              Adnexa: no mass, fullness, tenderness              Rectal exam: Yes.  .  Confirms.              Anus:  normal sphincter tone, no lesions  Chaperone was present for exam.  Assessment:   Well woman visit with normal exam. Hx LGSIL. Osteopenia. Hx fibroid.  Rectocele.  Mild aortic insufficiency.   Plan: Mammogram screening discussed. Recommended self breast awareness. Pap and HR HPV as above. Guidelines for Calcium, Vitamin D, regular exercise program including cardiovascular and weight bearing exercise. We discussed LEEP for persistent LGSIL but reviewed limitations of not removing HPV and causing potential scar tissue of the cervix.  Routine labs. Follow up annually and prn.     After visit summary provided.

## 2018-01-05 NOTE — Patient Instructions (Signed)

## 2018-01-06 LAB — CBC
HEMATOCRIT: 41.6 % (ref 34.0–46.6)
Hemoglobin: 13.6 g/dL (ref 11.1–15.9)
MCH: 31.1 pg (ref 26.6–33.0)
MCHC: 32.7 g/dL (ref 31.5–35.7)
MCV: 95 fL (ref 79–97)
PLATELETS: 279 10*3/uL (ref 150–379)
RBC: 4.38 x10E6/uL (ref 3.77–5.28)
RDW: 13.5 % (ref 12.3–15.4)
WBC: 6 10*3/uL (ref 3.4–10.8)

## 2018-01-06 LAB — COMPREHENSIVE METABOLIC PANEL
ALT: 22 IU/L (ref 0–32)
AST: 23 IU/L (ref 0–40)
Albumin/Globulin Ratio: 2 (ref 1.2–2.2)
Albumin: 4.8 g/dL (ref 3.6–4.8)
Alkaline Phosphatase: 61 IU/L (ref 39–117)
BUN/Creatinine Ratio: 19 (ref 12–28)
BUN: 15 mg/dL (ref 8–27)
Bilirubin Total: 0.8 mg/dL (ref 0.0–1.2)
CALCIUM: 10.1 mg/dL (ref 8.7–10.3)
CO2: 24 mmol/L (ref 20–29)
CREATININE: 0.8 mg/dL (ref 0.57–1.00)
Chloride: 101 mmol/L (ref 96–106)
GFR calc Af Amer: 89 mL/min/{1.73_m2} (ref >59)
GFR, EST NON AFRICAN AMERICAN: 78 mL/min/{1.73_m2} (ref >59)
GLOBULIN, TOTAL: 2.4 g/dL (ref 1.5–4.5)
GLUCOSE: 93 mg/dL (ref 65–99)
Potassium: 4 mmol/L (ref 3.5–5.2)
Sodium: 143 mmol/L (ref 134–144)
Total Protein: 7.2 g/dL (ref 6.0–8.5)

## 2018-01-06 LAB — LIPID PANEL
Chol/HDL Ratio: 3.5 ratio (ref 0.0–4.4)
Cholesterol, Total: 212 mg/dL — ABNORMAL HIGH (ref 100–199)
HDL: 60 mg/dL (ref >39)
LDL Calculated: 123 mg/dL — ABNORMAL HIGH (ref 0–99)
Triglycerides: 144 mg/dL (ref 0–149)
VLDL Cholesterol Cal: 29 mg/dL (ref 5–40)

## 2018-01-06 LAB — TSH: TSH: 2.64 u[IU]/mL (ref 0.450–4.500)

## 2018-01-06 LAB — VITAMIN D 25 HYDROXY (VIT D DEFICIENCY, FRACTURES): Vit D, 25-Hydroxy: 38.8 ng/mL (ref 30.0–100.0)

## 2018-01-07 LAB — CYTOLOGY - PAP
DIAGNOSIS: NEGATIVE
HPV (WINDOPATH): DETECTED — AB

## 2018-01-09 ENCOUNTER — Telehealth: Payer: Self-pay | Admitting: Obstetrics and Gynecology

## 2018-01-09 DIAGNOSIS — B977 Papillomavirus as the cause of diseases classified elsewhere: Secondary | ICD-10-CM

## 2018-01-09 NOTE — Telephone Encounter (Signed)
Left message to call [place] at [phone].  Patient may be seen on 01/19/2018 at 2 pm with Dr.Silva.

## 2018-01-09 NOTE — Telephone Encounter (Signed)
Spoke with patient. Results given. Patient verbalizes understanding. States she will need to look at her schedule and return call to schedule colposcopy. Will keep in results.  Notes recorded by Nunzio Cobbs, MD on 01/08/2018 at 5:24 PM EDT Please inform patient of results.  Her LDL cholesterol is mildly elevated.  The ratios of the cholesterol are still good, but her cholesterol did trend upward from last year.  Watching cholesterol and saturated fats in her diet will help to reduce this.   Her thyroid, vit d, CMP, and CBC are all normal.  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  By ASCCP guidelines, she needs a colposcopy again.  Please send to precert and schedule with me.  Routing to provider for final review. Patient agreeable to disposition. Will close encounter.

## 2018-01-09 NOTE — Telephone Encounter (Signed)
Spoke with patient. Colposcopy scheduled for 01/19/2018 at 2 pm with Dr.Silva.   Instructions given. Motrin 800 mg po x , one hour before appointment with food. Make sure to eat a meal before appointment and drink plenty of fluids.  Patient agreeable and verbalized understanding of all instructions. Order placed.  Routing to provider for final review. Patient agreeable to disposition. Will close encounter.

## 2018-01-09 NOTE — Telephone Encounter (Signed)
Patient called requesting to speak with nurse Verline Lema to schedule a colposcopy. She said the best dates are: 01/19/18 or 02/02/18 in the afternoon.

## 2018-01-09 NOTE — Telephone Encounter (Signed)
Patient called to see if her lab results are back yet. She was seen on 01/05/18.

## 2018-01-19 ENCOUNTER — Encounter: Payer: Self-pay | Admitting: Obstetrics and Gynecology

## 2018-01-19 ENCOUNTER — Ambulatory Visit (INDEPENDENT_AMBULATORY_CARE_PROVIDER_SITE_OTHER): Payer: 59 | Admitting: Obstetrics and Gynecology

## 2018-01-19 ENCOUNTER — Other Ambulatory Visit: Payer: Self-pay

## 2018-01-19 VITALS — BP 124/68 | HR 72 | Resp 16 | Ht 63.25 in | Wt 167.0 lb

## 2018-01-19 DIAGNOSIS — R87810 Cervical high risk human papillomavirus (HPV) DNA test positive: Secondary | ICD-10-CM

## 2018-01-19 DIAGNOSIS — N904 Leukoplakia of vulva: Secondary | ICD-10-CM | POA: Diagnosis not present

## 2018-01-19 DIAGNOSIS — N870 Mild cervical dysplasia: Secondary | ICD-10-CM | POA: Diagnosis not present

## 2018-01-19 DIAGNOSIS — B977 Papillomavirus as the cause of diseases classified elsewhere: Secondary | ICD-10-CM

## 2018-01-19 NOTE — Patient Instructions (Signed)
Atrophic Vaginitis Atrophic vaginitis is a condition in which the tissues that line the vagina become dry and thin. This condition is most common in women who have stopped having regular menstrual periods (menopause). This usually starts when a woman is 45-66 years old. Estrogen helps to keep the vagina moist. It stimulates the vagina to produce a clear fluid that lubricates the vagina for sexual intercourse. This fluid also protects the vagina from infection. Lack of estrogen can cause the lining of the vagina to get thinner and dryer. The vagina may also shrink in size. It may become less elastic. Atrophic vaginitis tends to get worse over time as a woman's estrogen level drops. What are the causes? This condition is caused by the normal drop in estrogen that happens around the time of menopause. What increases the risk? Certain conditions or situations may lower a woman's estrogen level, which increases her risk of atrophic vaginitis. These include:  Taking medicine that blocks estrogen.  Having ovaries removed surgically.  Being treated for cancer with X-ray treatment (radiation) or medicines (chemotherapy).  Exercising very hard and often.  Having an eating disorder (anorexia).  Giving birth or breastfeeding.  Being over the age of 50.  Smoking.  What are the signs or symptoms? Symptoms of this condition include:  Pain, soreness, or bleeding during sexual intercourse (dyspareunia).  Vaginal burning, irritation, or itching.  Pain or bleeding during a vaginal examination using a speculum (pelvic exam).  Loss of interest in sexual activity.  Having burning pain when passing urine.  Vaginal discharge that is brown or yellow.  In some cases, there are no symptoms. How is this diagnosed? This condition is diagnosed with a medical history and physical exam. This will include a pelvic exam that checks whether the inside of your vagina appears pale, thin, or dry. Rarely, you may  also have other tests, including:  A urine test.  A test that checks the acid balance in your vaginal fluid (acid balance test).  How is this treated? Treatment for this condition may depend on the severity of your symptoms. Treatment may include:  Using an over-the-counter vaginal lubricant before you have sexual intercourse.  Using a long-acting vaginal moisturizer.  Using low-dose vaginal estrogen for moderate to severe symptoms that do not respond to other treatments. Options include creams, tablets, and inserts (vaginal rings). Before using vaginal estrogen, tell your health care provider if you have a history of: ? Breast cancer. ? Endometrial cancer. ? Blood clots.  Taking medicines. You may be able to take a daily pill for dyspareunia. Discuss all of the risks of this medicine with your health care provider. It is usually not recommended for women who have a family history or personal history of breast cancer.  If your symptoms are very mild and you are not sexually active, you may not need treatment. Follow these instructions at home:  Take medicines only as directed by your health care provider. Do not use herbal or alternative medicines unless your health care provider says that you can.  Use over-the-counter creams, lubricants, or moisturizers for dryness only as directed by your health care provider.  If your atrophic vaginitis is caused by menopause, discuss all of your menopausal symptoms and treatment options with your health care provider.  Do not douche.  Do not use products that can make your vagina dry. These include: ? Scented feminine sprays. ? Scented tampons. ? Scented soaps.  If it hurts to have sex, talk with your sexual   partner. Contact a health care provider if:  Your discharge looks different than normal.  Your vagina has an unusual smell.  You have new symptoms.  Your symptoms do not improve with treatment.  Your symptoms get worse. This  information is not intended to replace advice given to you by your health care provider. Make sure you discuss any questions you have with your health care provider. Document Released: 02/21/2015 Document Revised: 03/14/2016 Document Reviewed: 09/28/2014 Elsevier Interactive Patient Education  2018 [place] After This sheet gives you information about how to care for yourself after your procedure. Your health care provider may also give you more specific instructions. If you have problems or questions, contact your health care provider. What can I expect after the procedure? If you had a colposcopy without a biopsy, you can expect to feel fine right away, but you may have some spotting for a few days. You can go back to your normal activities. If you had a colposcopy with a biopsy, it is common to have:  Soreness and pain. This may last for a few days.  Light-headedness.  Mild vaginal bleeding or dark-colored, grainy discharge. This may last for a few days. The discharge may be due to a solution that was used during the procedure. You may need to wear a sanitary pad during this time.  Spotting for at least 48 hours after the procedure.  Follow these instructions at home:  Take over-the-counter and prescription medicines only as told by your health care provider. Talk with your health care provider about what type of over-the-counter pain medicine and prescription medicine you can start taking again. It is especially important to talk with your health care provider if you take blood-thinning medicine.  Do not drive or use heavy machinery while taking prescription pain medicine.  For at least 3 days after your procedure, or as long as told by your health care provider, avoid: ? Douching. ? Using tampons. ? Having sexual intercourse.  Continue to use birth control (contraception).  Limit your physical activity for the first day after the procedure as told by your  health care provider. Ask your health care provider what activities are safe for you.  It is up to you to get the results of your procedure. Ask your health care provider, or the department performing the procedure, when your results will be ready.  Keep all follow-up visits as told by your health care provider. This is important. Contact a health care provider if:  You develop a skin rash. Get help right away if:  You are bleeding heavily from your vagina or you are passing blood clots. This includes using more than one sanitary pad per hour for 2 hours in a row.  You have a fever or chills.  You have pelvic pain.  You have abnormal, yellow-colored, or bad-smelling vaginal discharge. This could be a sign of infection.  You have severe pain or cramps in your lower abdomen that do not get better with medicine.  You feel light-headed or dizzy, or you faint. Summary  If you had a colposcopy without a biopsy, you can expect to feel fine right away, but you may have some spotting for a few days. You can go back to your normal activities.  If you had a colposcopy with a biopsy, you may notice mild pain and spotting for 48 hours after the procedure.  Avoid douching, using tampons, and having sexual intercourse for 3 days after the procedure or  as long as told by your health care provider.  Contact your health care provider if you have bleeding, severe pain, or signs of infection. This information is not intended to replace advice given to you by your health care provider. Make sure you discuss any questions you have with your health care provider. Document Released: 07/28/2013 Document Revised: 05/24/2016 Document Reviewed: 05/24/2016 Elsevier Interactive Patient Education  2018 [employer].

## 2018-01-19 NOTE — Progress Notes (Signed)
Subjective:     Patient ID: Rebecca Terry, female   DOB: 02-25-1951, 67 y.o.   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  Speculum placed.  Atrophy immediately noted as petechiae of the cervix and micro lacerations from speculum contact.  3% acetic acid used vaginally and then Lugol's later placed.  Colposcopy satisfactory.  White light and green light filter used.  ECC performed and sent to pathology.  Biopsy of subtle acetowhite change at 6:00 take and sent to pathology.  Monsel's applied.   3% acetic acid soaked gauze to the perineum.  Salt and pepper type HPV lesions of the bilateral labia and perineum.  Local 1% lidocaine - lot number [id number], exp 01/23.  Biopsy of right perineum above the anal region and sent to pathology.  AgNO3 and then 3/0 vicryl suture placed.   Minimal EBL. No complications.      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  HPV changes noted today.  Atrophy noted as well.     Plan:     FU biopsies.  Instructions/precautions given.  Start using vaginal estrogen cream 1/2 gram pv at hs twice weekly.  This could be increased up to 1 gram pv twice weekly if needed.   I anticipate pap and HR HPV testing in one year.   After visit summary to patient.

## 2018-01-23 ENCOUNTER — Telehealth: Payer: Self-pay | Admitting: *Deleted

## 2018-01-23 NOTE — Telephone Encounter (Signed)
Call to patient. Per ROI, can leave message on voice mail. Left message to call back, nothing wrong, no emergency.

## 2018-01-23 NOTE — Telephone Encounter (Signed)
-----   Message from Nunzio Cobbs, MD sent at 01/23/2018  5:57 AM EDT ----- Please report results of the colpo biopsies.  Biopsy of the cervix showed mild HPV effect, CIN I. The vulvar biopsy was benign. Options for care are LEEP due to persistent dysplasia or continued observation with cotesting (pap and HR HPV) yearly.  Pap recall - 08.  The LEEP will not remove the HPV from the genital tract.

## 2018-01-26 MED FILL — HYDROCHLOROTHIAZIDE 25 MG T: 25 | 90 days supply | Qty: 90 | Fill #0

## 2018-01-26 NOTE — Telephone Encounter (Signed)
Return call from patient. Advised of results as instructed by Dr Quincy Simmonds. Patient will plan to monitor for now. AEX/pap/co-test scheduled for 02-08-19. 08 recall entered for 02-18-19.  Encounter closed.

## 2018-02-20 ENCOUNTER — Ambulatory Visit: Payer: 59 | Admitting: Obstetrics and Gynecology

## 2018-03-02 DIAGNOSIS — J029 Acute pharyngitis, unspecified: Secondary | ICD-10-CM | POA: Diagnosis not present

## 2018-03-05 MED FILL — AZITHROMYCIN 250 MG TABS: 250 | 5 days supply | Qty: 6 | Fill #0

## 2018-03-20 MED FILL — LOSARTAN POTASSIUM 100 MG T: 100 | 90 days supply | Qty: 90 | Fill #1

## 2018-04-06 DIAGNOSIS — L989 Disorder of the skin and subcutaneous tissue, unspecified: Secondary | ICD-10-CM | POA: Diagnosis not present

## 2018-05-04 MED FILL — HYDROCHLOROTHIAZIDE 25 MG T: 25 | 90 days supply | Qty: 90 | Fill #1

## 2018-06-24 MED FILL — LOSARTAN POTASSIUM 100 MG T: 100 | 90 days supply | Qty: 90 | Fill #0

## 2018-08-11 MED FILL — HYDROCHLOROTHIAZIDE 25 MG T: 25 | 90 days supply | Qty: 90 | Fill #0

## 2018-09-23 DIAGNOSIS — I10 Essential (primary) hypertension: Secondary | ICD-10-CM | POA: Diagnosis not present

## 2018-09-23 DIAGNOSIS — Z1389 Encounter for screening for other disorder: Secondary | ICD-10-CM | POA: Diagnosis not present

## 2018-09-24 MED FILL — LOSARTAN POTASSIUM 100 MG T: 100 | 30 days supply | Qty: 30 | Fill #0

## 2018-10-06 DIAGNOSIS — L821 Other seborrheic keratosis: Secondary | ICD-10-CM | POA: Diagnosis not present

## 2018-10-06 DIAGNOSIS — L570 Actinic keratosis: Secondary | ICD-10-CM | POA: Diagnosis not present

## 2018-10-06 DIAGNOSIS — D485 Neoplasm of uncertain behavior of skin: Secondary | ICD-10-CM | POA: Diagnosis not present

## 2018-10-07 DIAGNOSIS — H5203 Hypermetropia, bilateral: Secondary | ICD-10-CM | POA: Diagnosis not present

## 2018-10-30 MED FILL — LOSARTAN POTASSIUM 100 MG T: 100 | 30 days supply | Qty: 30 | Fill #1

## 2018-11-12 NOTE — Progress Notes (Signed)
Cardiology Office Note   Date:  11/17/2018   ID:  [name], DOB 29-Jul-1951, MRN [id number]  PCP:  Donald Prose, MD    No chief complaint on file.  HTN  Wt Readings from Last 3 Encounters:  11/17/18 170 lb (77.1 kg)  01/19/18 167 lb (75.8 kg)  01/05/18 167 lb (75.8 kg)       History of Present Illness: Rebecca Terry is a 68 y.o. female  with HTN and aortic insufficiency. BP has been well controlled.    Readings at home < 140/80.  Denies : Chest pain. Dizziness. Leg edema. Nitroglycerin use. Orthopnea. Palpitations. Paroxysmal nocturnal dyspnea. Shortness of breath. Syncope.   She has gained some weight.  She is trying to eat healthy.  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  She is considering starting intermittent fasting as well.  2015 echo: Left ventricle: The cavity size was normal. Wall thickness was normal. Systolic function was normal. The estimated ejection fraction was in the range of 55% to 60%. Wall motion was normal; there were no regional wall motion abnormalities. Doppler parameters are consistent with abnormal left ventricular relaxation (grade 1 diastolic dysfunction). The E/e&' ratio is between 8-15, suggesting indeterminate LV filling pressure. - Aortic valve: Structurally normal valve. Trileaflet. There was mild regurgitation. - Mitral valve: Mildly thickened leaflets . There was mild regurgitation. - Left atrium: The atrium was normal in size.  Impressions:  - Compared to the prior echo in 2012, there is persistent mild AI and mild MR. The LVEF has improved to 55-60%.    Past Medical History:  Diagnosis Date  . 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 Aortic valve disorders   . Arthritis   . Diverticulosis   . Elevated bilirubin 08/09/2016   level 1.4  . Essential hypertension,  benign   . Fibroid    posterior  . Herpes genitalis in women   . History of MRSA infection 07/2015   culture positive [facility]  . Hypertension   . Osteopenia 2018   hip and spine  . Primary localized osteoarthritis of left knee   . Rectocele   . Urinary incontinence     Past Surgical History:  Procedure Laterality Date  . arthroscopic knee Left 2001  . BILATERAL OOPHORECTOMY  2010   Dr. Quincy Simmonds  . CARPAL TUNNEL RELEASE Bilateral   . COLONOSCOPY    . DIAGNOSTIC LAPAROSCOPY    . PELVIC LAPAROSCOPY     x2--ectopics  . TOTAL KNEE ARTHROPLASTY Left 12/11/2015   Procedure: LEFT TOTAL KNEE ARTHROPLASTY;  Surgeon: Elsie Saas, MD;  Location: [facility];  Service: Orthopedics;  Laterality: Left;     Current Outpatient Medications  Medication Sig Dispense Refill  . aspirin EC 81 MG tablet Take 81 mg by mouth daily.    . clobetasol cream (TEMOVATE) 3.01 % Apply 1 application topically daily as needed.   1  . hydrochlorothiazide (HYDRODIURIL) 25 MG tablet Take 25 mg by mouth daily.  1  . losartan (COZAAR) 100 MG tablet Take 100 mg by mouth daily.    . Magnesium 400 MG TABS Take 1 tablet by mouth daily.    . Multiple Vitamin (MULTIVITAMIN) tablet Take 1 tablet by mouth daily.    . TURMERIC PO Take 1 tablet by mouth daily.     No  current facility-administered medications for this visit.     Allergies:   Lisinopril; Adhesive [tape]; Hibiclens [chlorhexidine gluconate]; Latex; Nickel; and Penicillins    Social History:  The patient  reports that she quit smoking about 25 years ago. Her smoking use included cigarettes. She has a 10.00 pack-year smoking history. She has never used smokeless tobacco. She reports current alcohol use of about 5.0 standard drinks of alcohol per week. She reports that she does not use drugs.   Family History:  The patient's family history includes Asthma in her mother; Congestive Heart Failure in her father; Heart attack in her father; Multiple sclerosis in  her father.    ROS:  Please see the history of present illness.   Otherwise, review of systems are positive for weight gain.   All other systems are reviewed and negative.    PHYSICAL EXAM: VS:  BP 136/82   Pulse 79   Ht 5' 3.25" (1.607 m)   Wt 170 lb (77.1 kg)   LMP 10/21/1992   SpO2 96%   BMI 29.88 kg/m  , BMI Body mass index is 29.88 kg/m. GEN: Well nourished, well developed, in no acute distress  HEENT: normal  Neck: no JVD, carotid bruits, or masses Cardiac: RRR; no murmurs, rubs, or gallops,no edema  Respiratory:  clear to auscultation bilaterally, normal work of breathing GI: soft, nontender, nondistended, + BS MS: no deformity or atrophy  Skin: warm and dry, no rash Neuro:  Strength and sensation are intact Psych: euthymic mood, full affect   EKG:   The ekg ordered today demonstrates NSR, nonspecific ST changes   Recent Labs: 01/05/2018: ALT 22; BUN 15; Creatinine, Ser 0.80; Hemoglobin 13.6; Platelets 279; Potassium 4.0; Sodium 143; TSH 2.640   Lipid Panel    Component Value Date/Time   CHOL 212 (H) 01/05/2018 1502   TRIG 144 01/05/2018 1502   HDL 60 01/05/2018 1502   CHOLHDL 3.5 01/05/2018 1502   CHOLHDL 3.0 08/09/2016 1500   VLDL 22 08/09/2016 1500   LDLCALC 123 (H) 01/05/2018 1502     Other studies Reviewed: Additional studies/ records that were reviewed today with results demonstrating: .   ASSESSMENT AND PLAN:  1. AI: No signs of heart failure.  Continue blood pressure management. 2. HTN: Blood pressure well controlled.  Encouraged her to try to avoid salt in her diet.  Continue regular exercise.    Current medicines are reviewed at length with the patient today.  The patient concerns regarding her medicines were addressed.  The following changes have been made:  No change  Labs/ tests ordered today include:   Orders Placed This Encounter  Procedures  . EKG 12-Lead    Recommend 150 minutes/week of aerobic exercise Low fat, low carb,  high fiber diet recommended  Disposition:   FU in 1 year   Signed, Larae Grooms, MD  11/17/2018 5:20 PM    [facility] [place], [place], [place]  [address] Phone: [phone]; Fax: [phone]

## 2018-11-17 ENCOUNTER — Ambulatory Visit: Payer: 59 | Admitting: Interventional Cardiology

## 2018-11-17 ENCOUNTER — Encounter: Payer: Self-pay | Admitting: Interventional Cardiology

## 2018-11-17 VITALS — BP 136/82 | HR 79 | Ht 63.25 in | Wt 170.0 lb

## 2018-11-17 DIAGNOSIS — I359 Nonrheumatic aortic valve disorder, unspecified: Secondary | ICD-10-CM | POA: Diagnosis not present

## 2018-11-17 DIAGNOSIS — I10 Essential (primary) hypertension: Secondary | ICD-10-CM | POA: Diagnosis not present

## 2018-11-17 NOTE — Patient Instructions (Signed)
Medication Instructions:  Your physician recommends that you continue on your current medications as directed. Please refer to the Current Medication list given to you today.  If you need a refill on your cardiac medications before your next appointment, please call your pharmacy.   Lab work: None Ordered  If you have labs (blood work) drawn today and your tests are completely normal, you will receive your results only by: Marland Kitchen MyChart Message (if you have MyChart) OR . A paper copy in the mail If you have any lab test that is abnormal or we need to change your treatment, we will call you to review the results.  Testing/Procedures: Your physician recommends that you have a Coronary Calcium Score CT.  Follow-Up: At [facility], you and your health needs are our priority.  As part of our continuing mission to provide you with exceptional heart care, we have created designated Provider Care Teams.  These Care Teams include your primary Cardiologist (physician) and Advanced Practice Providers (APPs -  Physician Assistants and Nurse Practitioners) who all work together to provide you with the care you need, when you need it. . You will need a follow up appointment in 1 year.  Please call our office 2 months in advance to schedule this appointment.  You may see Casandra Doffing, MD or one of the following Advanced Practice Providers on your designated Care Team:   . Lyda Jester, PA-C . Dayna Dunn, PA-C . Ermalinda Barrios, PA-C  Any Other Special Instructions Will Be Listed Below (If Applicable).

## 2018-11-19 NOTE — Addendum Note (Signed)
Addended by: Drue Novel I on: 11/19/2018 07:41 AM   Modules accepted: Orders

## 2018-11-27 MED FILL — HYDROCHLOROTHIAZIDE 25 MG T: 25 | 30 days supply | Qty: 30 | Fill #0

## 2018-11-27 MED FILL — LOSARTAN POTASSIUM 100 MG T: 100 | 30 days supply | Qty: 30 | Fill #2

## 2018-12-11 ENCOUNTER — Ambulatory Visit (INDEPENDENT_AMBULATORY_CARE_PROVIDER_SITE_OTHER)
Admission: RE | Admit: 2018-12-11 | Discharge: 2018-12-11 | Disposition: A | Discharge status: Home / Self Care | Payer: 59 | Source: Ambulatory Visit | Attending: Interventional Cardiology | Admitting: Interventional Cardiology

## 2018-12-11 DIAGNOSIS — I10 Essential (primary) hypertension: Secondary | ICD-10-CM

## 2018-12-11 DIAGNOSIS — I359 Nonrheumatic aortic valve disorder, unspecified: Secondary | ICD-10-CM

## 2018-12-24 MED FILL — LOSARTAN POTASSIUM 100 MG T: 100 | 30 days supply | Qty: 30 | Fill #3

## 2018-12-24 MED FILL — HYDROCHLOROTHIAZIDE 25 MG T: 25 | 30 days supply | Qty: 30 | Fill #1

## 2019-01-16 MED FILL — HYDROCHLOROTHIAZIDE 25 MG T: 25 | 30 days supply | Qty: 30 | Fill #2

## 2019-01-16 MED FILL — LOSARTAN POTASSIUM 100 MG T: 100 | 30 days supply | Qty: 30 | Fill #4

## 2019-02-05 ENCOUNTER — Ambulatory Visit: Payer: 59 | Admitting: Obstetrics and Gynecology

## 2019-02-08 ENCOUNTER — Ambulatory Visit: Payer: 59 | Admitting: Obstetrics and Gynecology

## 2019-02-15 MED FILL — LOSARTAN POTASSIUM 100 MG T: 100 | 30 days supply | Qty: 30 | Fill #5

## 2019-02-15 MED FILL — HYDROCHLOROTHIAZIDE 25 MG T: 25 | 30 days supply | Qty: 30 | Fill #3

## 2019-02-16 DIAGNOSIS — G4700 Insomnia, unspecified: Secondary | ICD-10-CM | POA: Diagnosis not present

## 2019-02-16 MED FILL — ZOLPIDEM TARTRATE 5 MG TAB: 5 | 15 days supply | Qty: 15 | Fill #0

## 2019-03-17 MED FILL — HYDROCHLOROTHIAZIDE 25 MG T: 25 | 30 days supply | Qty: 30 | Fill #4

## 2019-03-17 MED FILL — LOSARTAN POTASSIUM 100 MG T: 100 | 30 days supply | Qty: 30 | Fill #0

## 2019-03-26 DIAGNOSIS — Z1389 Encounter for screening for other disorder: Secondary | ICD-10-CM | POA: Diagnosis not present

## 2019-03-26 DIAGNOSIS — I10 Essential (primary) hypertension: Secondary | ICD-10-CM | POA: Diagnosis not present

## 2019-04-15 MED FILL — HYDROCHLOROTHIAZIDE 25 MG T: 25 | 30 days supply | Qty: 30 | Fill #5

## 2019-04-15 MED FILL — LOSARTAN POTASSIUM 100 MG T: 100 | 30 days supply | Qty: 30 | Fill #1

## 2019-05-10 MED FILL — LOSARTAN POTASSIUM 100 MG T: 100 | 30 days supply | Qty: 30 | Fill #2

## 2019-05-15 MED FILL — HYDROCHLOROTHIAZIDE 25 MG T: 25 | 90 days supply | Qty: 90 | Fill #0

## 2019-05-19 MED FILL — LOSARTAN POTASSIUM 100 MG T: 100 | 30 days supply | Qty: 30 | Fill #2

## 2019-06-01 DIAGNOSIS — L259 Unspecified contact dermatitis, unspecified cause: Secondary | ICD-10-CM | POA: Diagnosis not present

## 2019-06-01 MED FILL — predniSONE 10 MG TABS: 10 | 5 days supply | Qty: 15 | Fill #0

## 2019-06-14 ENCOUNTER — Other Ambulatory Visit: Payer: Self-pay

## 2019-06-14 MED FILL — LOSARTAN POTASSIUM 100 MG T: 100 | 30 days supply | Qty: 30 | Fill #0

## 2019-06-14 NOTE — Progress Notes (Signed)
68 y.o. [id number] Single Caucasian female here for annual exam.    Poison oak.  Took prednisone pack.   No significant urinary incontinence.  Rectocele is sometimes symptomatic.  Taking magnesium.  [id number] Sun, MD   Patient's last menstrual period was 10/21/1992.           Sexually active: No.  The current method of family planning is post menopausal status.    Exercising: Yes.    walks 3-4x/week--2 miles Smoker:  no  Health Maintenance: Pap:01-05-18 Neg:Pos HR HPV, 08-09-16 Neg:Pos HR HPV  History of abnormal Pap:  Yes, 01-05-18 Neg:Pos HR HPV, 01-19-18 colpo cx bx mild HPV, CIN I; vulvar Bx neg.  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 06/08/15 Pap smear Exocervix bx - LGSIL, ECC negative; 05/08/15 Neg:Pos HR HPV; Pap smear 05-08-15 normal:Pos HR HPV--colposcopy revealing ECC neg and Exocervix LGSIL.History of pap 04-06-14 Ascus:Pos HR HPV with colposcopy revealing LGSIL of cervix and VIN I of perianal region MMG: 12-12-17 Neg/density B/biRads1--knows to schedule Colonoscopy: 2012 diverticulosis/nl;next 2022 BMD:  11-15-16  Result :Osteopenia TDaP:  03/2013 Gardasil:   n/a HIV:08-09-16 NR Hep C: 08-09-16 Neg Screening Labs:  ----   reports that she quit smoking about 26 years ago. Her smoking use included cigarettes. She has a 10.00 pack-year smoking history. She has never used smokeless tobacco. She reports current alcohol use of about 5.0 standard drinks of alcohol per week. She reports that she does not use drugs.  Past Medical History:  Diagnosis Date  . 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 Aortic valve disorders   . Arthritis   . Diverticulosis   . Elevated bilirubin 08/09/2016   level 1.4  . Essential hypertension, benign   . Fibroid    posterior  . Herpes genitalis in  women   . History of MRSA infection 07/2015   culture positive [facility]  . Hypertension   . Osteopenia 2018   hip and spine  . Primary localized osteoarthritis of left knee   . Rectocele   . Urinary incontinence     Past Surgical History:  Procedure Laterality Date  . arthroscopic knee Left 2001  . BILATERAL OOPHORECTOMY  2010   Dr. Quincy Simmonds  . CARPAL TUNNEL RELEASE Bilateral   . COLONOSCOPY    . DIAGNOSTIC LAPAROSCOPY    . PELVIC LAPAROSCOPY     x2--ectopics  . TOTAL KNEE ARTHROPLASTY Left 12/11/2015   Procedure: LEFT TOTAL KNEE ARTHROPLASTY;  Surgeon: Elsie Saas, MD;  Location: [facility];  Service: Orthopedics;  Laterality: Left;    Current Outpatient Medications  Medication Sig Dispense Refill  . clobetasol cream (TEMOVATE) [id number] % Apply 1 application topically daily as needed.   1  . hydrochlorothiazide (HYDRODIURIL) 25 MG tablet Take 25 mg by mouth daily.  1  . losartan (COZAAR) 100 MG tablet Take 100 mg by mouth daily.    . Magnesium 400 MG TABS Take 1 tablet by mouth daily.    . Multiple Vitamin (MULTIVITAMIN) tablet Take 1 tablet by mouth daily.    . TURMERIC PO Take 1 tablet by mouth daily.    Marland Kitchen zolpidem (AMBIEN) 5 MG tablet Take 1 tablet by mouth as needed.     No current facility-administered medications for this visit.     Family  History  Problem Relation Age of Onset  . Asthma Mother   . Congestive Heart Failure Father   . Multiple sclerosis Father   . Heart attack Father     Review of Systems  Skin: Positive for rash.    Exam:   BP (!) 148/82 (Cuff Size: Large)   Pulse 76   Temp 97.8 F (36.6 C)   Resp 14   Ht 5\' 3"  (1.6 m)   Wt 170 lb 9.6 oz (77.4 kg)   LMP 10/21/1992   BMI 30.22 kg/m     General appearance: alert, cooperative and appears stated age Head: normocephalic, without obvious abnormality, atraumatic Neck: no adenopathy, supple, symmetrical, trachea midline and thyroid normal to inspection and palpation Lungs: clear to  auscultation bilaterally Breasts: normal appearance, no masses or tenderness, No nipple retraction or dimpling, No nipple discharge or bleeding, No axillary adenopathy Heart: regular rate and rhythm Abdomen: soft, non-tender; no masses, no organomegaly Extremities: extremities normal, atraumatic, no cyanosis or edema Skin: skin color, texture, turgor normal. No rashes or lesions Lymph nodes: cervical, supraclavicular, and axillary nodes normal. Neurologic: grossly normal  Pelvic: External genitalia:  no lesions              No abnormal inguinal nodes palpated.              Urethra:  normal appearing urethra with no masses, tenderness or lesions              Bartholins and Skenes: normal                 Vagina: normal appearing vagina with normal color and discharge, no lesions.  First degree rectocele.               Cervix: no lesions              Pap taken: Yes.   Bimanual Exam:  Uterus:  normal size, contour, position, consistency, mobility, non-tender              Adnexa: no mass, fullness, tenderness              Rectal exam: Yes.  .  Confirms.              Anus:  normal sphincter tone, no lesions  Chaperone was present for exam.  Assessment:   Well woman visit with normal exam. Hx LGSIL.  VIN I.  Osteopenia.  Hx fibroid.  Rectocele.  Mild aortic insufficiency.   Plan: Mammogram screening discussed. Self breast awareness reviewed. Pap and HR HPV as above. Guidelines for Calcium, Vitamin D, regular exercise program including cardiovascular and weight bearing exercise. Routine labs.  Her provider would like a copy.  Will fill prescription for Premarin cream after her mammogram is back and is normal.  BMD ordered.  Follow up annually and prn.   After visit summary provided.

## 2019-06-16 ENCOUNTER — Encounter: Payer: Self-pay | Admitting: Obstetrics and Gynecology

## 2019-06-16 ENCOUNTER — Other Ambulatory Visit (HOSPITAL_COMMUNITY)
Admission: RE | Admit: 2019-06-16 | Discharge: 2019-06-16 | Disposition: A | Discharge status: Home / Self Care | Payer: 59 | Source: Ambulatory Visit | Attending: Obstetrics and Gynecology | Admitting: Obstetrics and Gynecology

## 2019-06-16 ENCOUNTER — Other Ambulatory Visit: Payer: Self-pay

## 2019-06-16 ENCOUNTER — Ambulatory Visit: Payer: 59 | Admitting: Obstetrics and Gynecology

## 2019-06-16 VITALS — BP 148/82 | HR 76 | Temp 97.8°F | Resp 14 | Ht 63.0 in | Wt 170.6 lb

## 2019-06-16 DIAGNOSIS — Z01419 Encounter for gynecological examination (general) (routine) without abnormal findings: Secondary | ICD-10-CM

## 2019-06-16 DIAGNOSIS — E2839 Other primary ovarian failure: Secondary | ICD-10-CM

## 2019-06-16 NOTE — Patient Instructions (Signed)

## 2019-06-17 ENCOUNTER — Other Ambulatory Visit: Payer: Self-pay | Admitting: Obstetrics and Gynecology

## 2019-06-17 DIAGNOSIS — Z1231 Encounter for screening mammogram for malignant neoplasm of breast: Secondary | ICD-10-CM

## 2019-06-17 LAB — COMPREHENSIVE METABOLIC PANEL
ALT: 17 IU/L (ref 0–32)
AST: 22 IU/L (ref 0–40)
Albumin/Globulin Ratio: 1.6 (ref 1.2–2.2)
Albumin: 4.4 g/dL (ref 3.8–4.8)
Alkaline Phosphatase: 68 IU/L (ref 39–117)
BUN/Creatinine Ratio: 16 (ref 12–28)
BUN: 13 mg/dL (ref 8–27)
Bilirubin Total: 1.1 mg/dL (ref 0.0–1.2)
CO2: 22 mmol/L (ref 20–29)
Calcium: 10.2 mg/dL (ref 8.7–10.3)
Chloride: 99 mmol/L (ref 96–106)
Creatinine, Ser: 0.83 mg/dL (ref 0.57–1.00)
GFR calc Af Amer: 84 mL/min/{1.73_m2} (ref >59)
GFR calc non Af Amer: 73 mL/min/{1.73_m2} (ref >59)
Globulin, Total: 2.7 g/dL (ref 1.5–4.5)
Glucose: 87 mg/dL (ref 65–99)
Potassium: 3.9 mmol/L (ref 3.5–5.2)
Sodium: 139 mmol/L (ref 134–144)
Total Protein: 7.1 g/dL (ref 6.0–8.5)

## 2019-06-17 LAB — CBC
Hematocrit: 39.1 % (ref 34.0–46.6)
Hemoglobin: 13.5 g/dL (ref 11.1–15.9)
MCH: 32.1 pg (ref 26.6–33.0)
MCHC: 34.5 g/dL (ref 31.5–35.7)
MCV: 93 fL (ref 79–97)
Platelets: 262 10*3/uL (ref 150–450)
RBC: 4.21 x10E6/uL (ref 3.77–5.28)
RDW: 12.4 % (ref 11.7–15.4)
WBC: 7.8 10*3/uL (ref 3.4–10.8)

## 2019-06-17 LAB — LIPID PANEL
Chol/HDL Ratio: 3.4 ratio (ref 0.0–4.4)
Cholesterol, Total: 213 mg/dL — ABNORMAL HIGH (ref 100–199)
HDL: 63 mg/dL (ref >39)
LDL Calculated: 123 mg/dL — ABNORMAL HIGH (ref 0–99)
Triglycerides: 137 mg/dL (ref 0–149)
VLDL Cholesterol Cal: 27 mg/dL (ref 5–40)

## 2019-06-18 ENCOUNTER — Telehealth: Payer: Self-pay | Admitting: Obstetrics and Gynecology

## 2019-06-18 LAB — CYTOLOGY - PAP
Diagnosis: NEGATIVE
HPV: NOT DETECTED

## 2019-06-18 NOTE — Telephone Encounter (Signed)
Spoke with patient, requesting 06/16/19 lab results. Advised patient waiting for pap results, will notify once completed. Advised CBC and CMP normal. Reviewed lipid panel with patient, advised of total cholesterol of 213 and LDL 123. Advised Dr. Quincy Simmonds still needs to review and make final recommendations, will notify once completed. Patient thankful for f/u.   Routing to Dr. Quincy Simmonds

## 2019-06-18 NOTE — Telephone Encounter (Signed)
Patient said she received a mychart notification that labs were back.

## 2019-06-21 NOTE — Telephone Encounter (Signed)
All results released through My Chart.  Encounter closed.

## 2019-07-05 MED FILL — LOSARTAN POTASSIUM 100 MG T: 100 | 30 days supply | Qty: 30 | Fill #0

## 2019-07-06 MED FILL — HYDROCHLOROTHIAZIDE 25 MG T: 25 | 90 days supply | Qty: 90 | Fill #0

## 2019-07-29 MED FILL — LOSARTAN POTASSIUM 100 MG T: 100 | 30 days supply | Qty: 30 | Fill #1

## 2019-08-10 ENCOUNTER — Other Ambulatory Visit: Payer: Self-pay

## 2019-08-10 DIAGNOSIS — Z20822 Contact with and (suspected) exposure to covid-19: Secondary | ICD-10-CM

## 2019-08-11 LAB — NOVEL CORONAVIRUS, NAA: SARS-CoV-2, NAA: NOT DETECTED

## 2019-08-11 MED FILL — CLOBETASOL PROPIONATE 0.05: 0.05 | 15 days supply | Qty: 15 | Fill #0

## 2019-08-19 ENCOUNTER — Ambulatory Visit
Admission: RE | Admit: 2019-08-19 | Discharge: 2019-08-19 | Disposition: A | Discharge status: Home / Self Care | Payer: 59 | Source: Ambulatory Visit | Attending: Obstetrics and Gynecology | Admitting: Obstetrics and Gynecology

## 2019-08-19 ENCOUNTER — Other Ambulatory Visit: Payer: Self-pay

## 2019-08-19 DIAGNOSIS — E2839 Other primary ovarian failure: Secondary | ICD-10-CM

## 2019-08-19 DIAGNOSIS — Z1231 Encounter for screening mammogram for malignant neoplasm of breast: Secondary | ICD-10-CM | POA: Diagnosis not present

## 2019-08-19 DIAGNOSIS — M8589 Other specified disorders of bone density and structure, multiple sites: Secondary | ICD-10-CM | POA: Diagnosis not present

## 2019-08-19 DIAGNOSIS — Z780 Asymptomatic menopausal state: Secondary | ICD-10-CM | POA: Diagnosis not present

## 2019-08-19 IMAGING — MG DIGITAL SCREENING BILATERAL MAMMOGRAM WITH TOMO AND CAD
8 series · 8 of 24 positions shown · non-contrast
Comparison: Previous exam(s).

CLINICAL DATA: Screening.

EXAM:
DIGITAL SCREENING BILATERAL MAMMOGRAM WITH TOMO AND CAD

[L CC synth-2D]
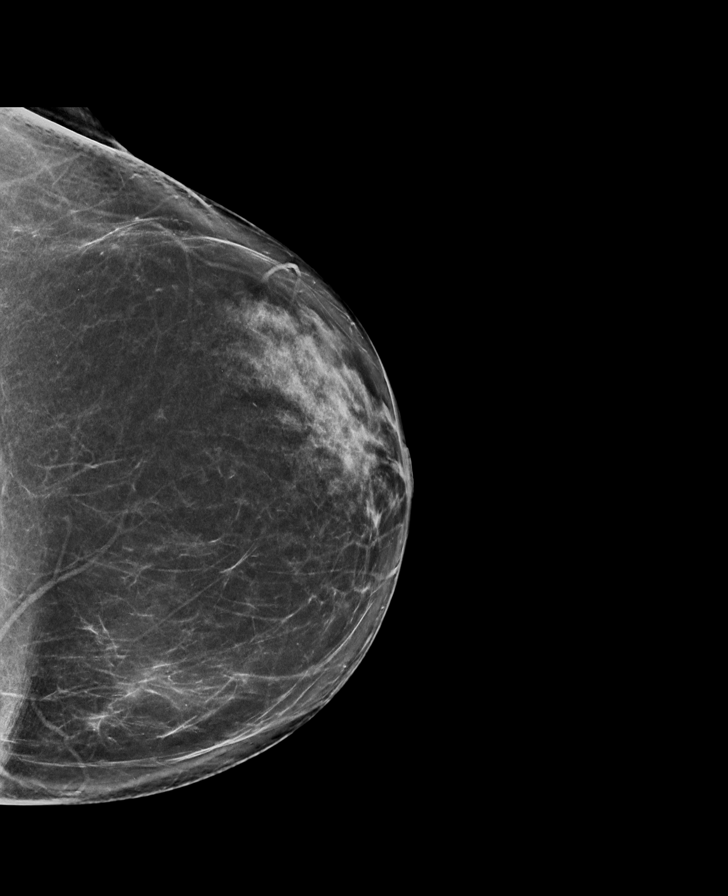

[R CC synth-2D]
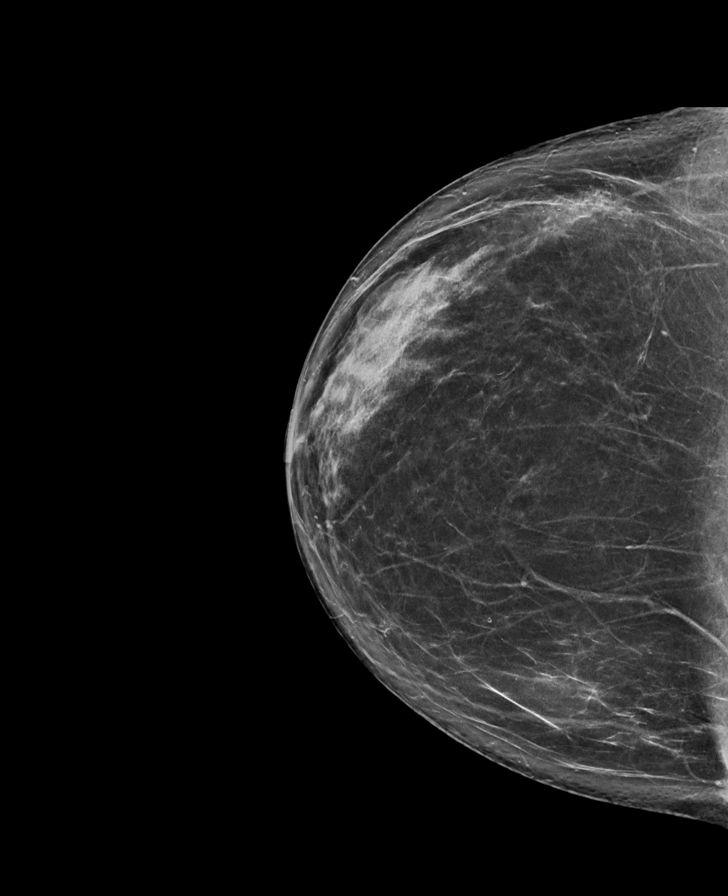

[R MLO synth-2D]
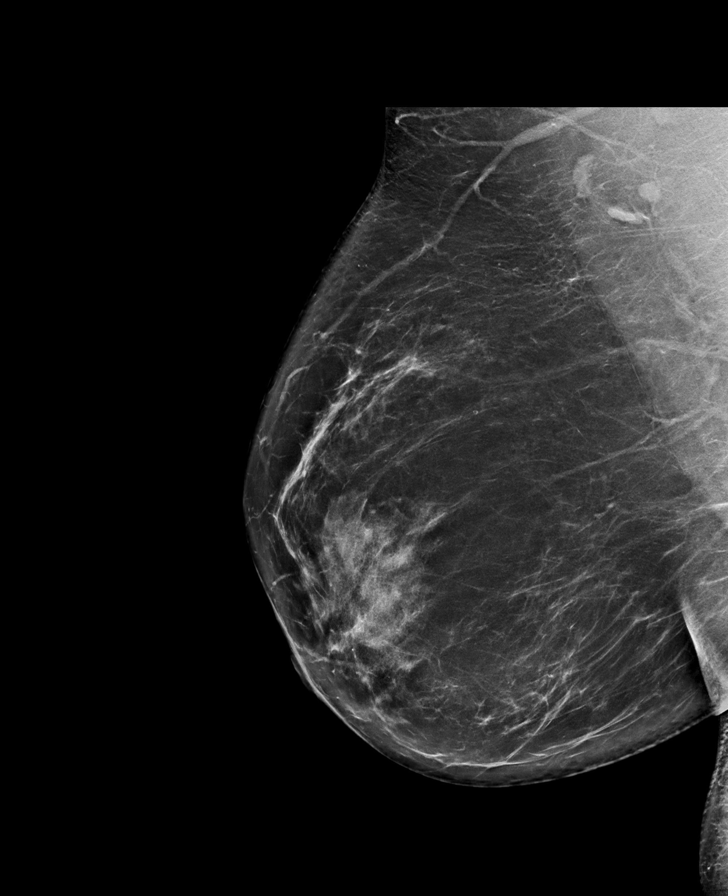

[L MLO synth-2D]
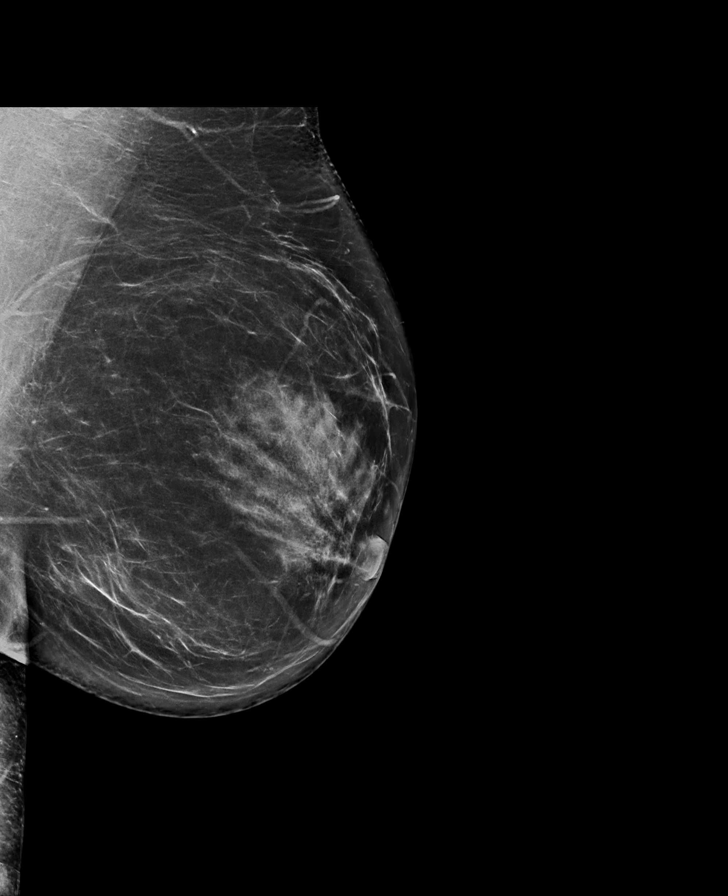

[L MLO tomo · tomo slice 45/89.0]
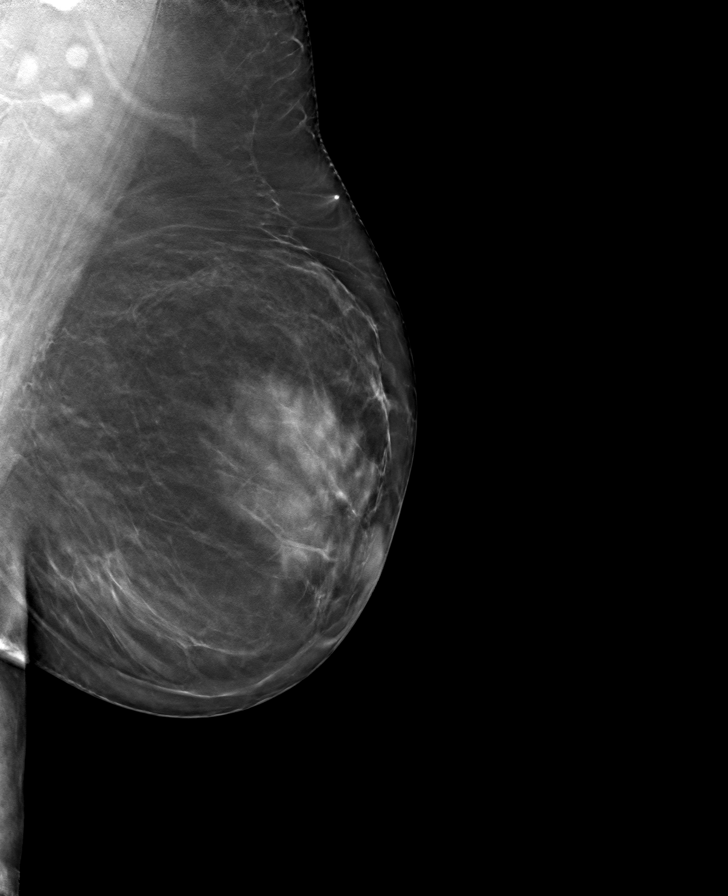

[L CC tomo · tomo slice 43/86.0]
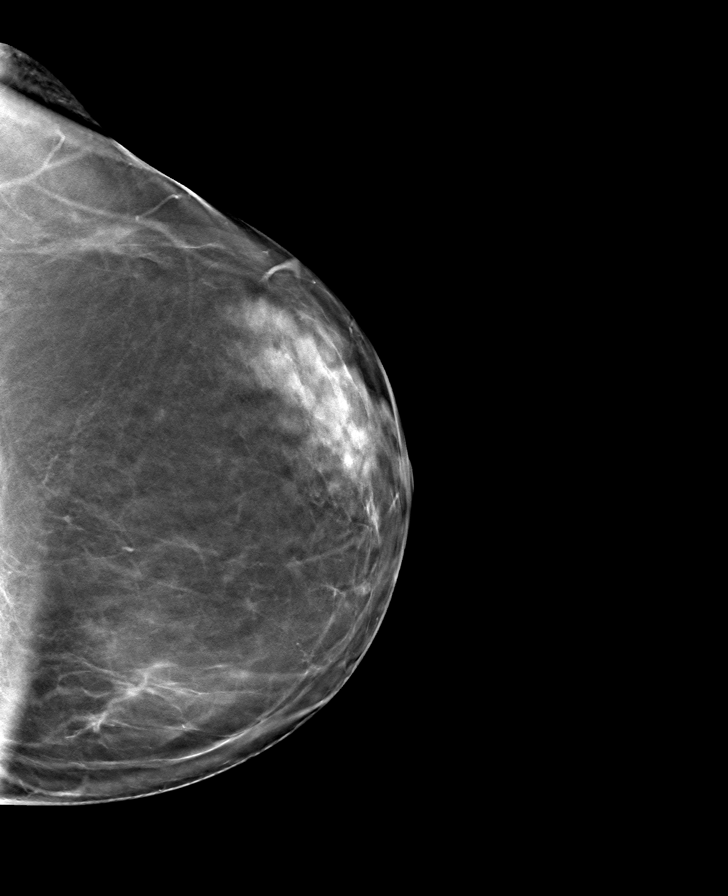

[R CC tomo · tomo slice 43/84.0]
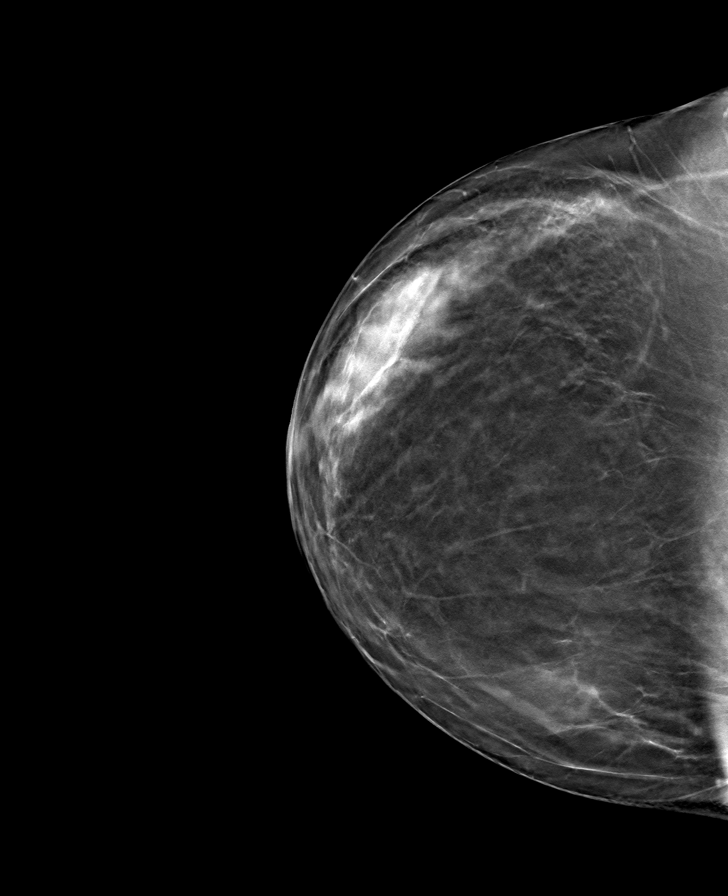

[R MLO tomo · tomo slice 47/94.0]
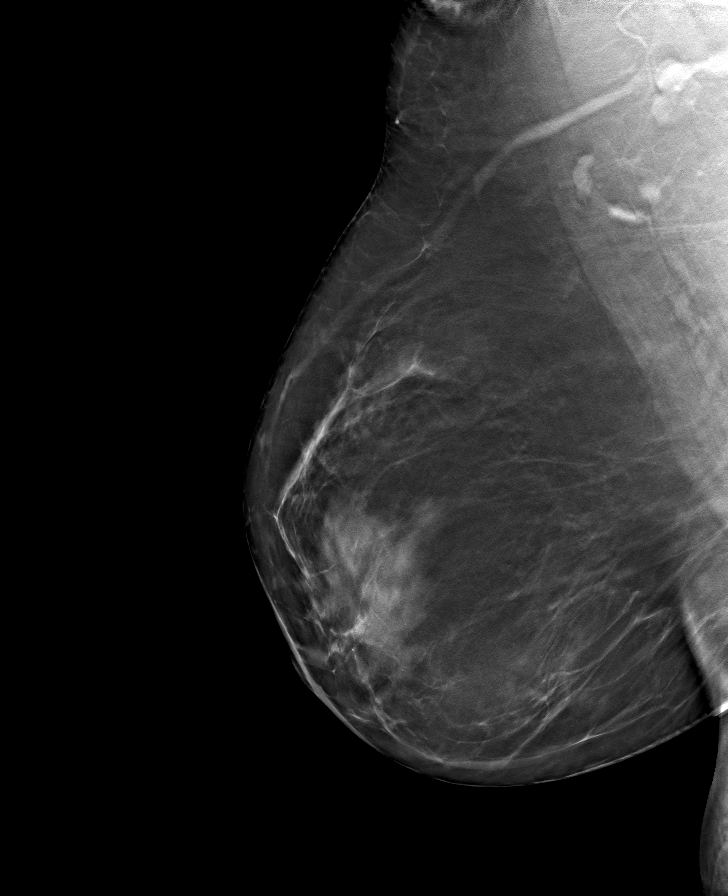

[8 of 24 positions shown; findings below may reference images not displayed]

ACR Breast Density Category b: There are scattered areas of
fibroglandular density.
FINDINGS: There are no findings suspicious for malignancy. Images were
processed with CAD.
IMPRESSION: No mammographic evidence of malignancy. A result letter of this
screening mammogram will be mailed directly to the patient.

RECOMMENDATION:
Screening mammogram in one year. (Code:[id number])

BI-RADS CATEGORY  1: Negative.

## 2019-08-26 ENCOUNTER — Telehealth: Payer: Self-pay | Admitting: Obstetrics and Gynecology

## 2019-08-26 MED FILL — LOSARTAN POTASSIUM 100 MG T: 100 | 30 days supply | Qty: 30 | Fill #2

## 2019-08-26 NOTE — Telephone Encounter (Signed)
Patient is calling to request a prescription for Ambien 5MG . Patient stated that she has experienced occasional insomnia.

## 2019-08-26 NOTE — Telephone Encounter (Signed)
Spoke with pt. Pt states having insomnia in the last couple of weeks with terrible nightmares and wanted to know if she could get a refill on Ambien 5mg . Pt gets PCP( Dr Nancy Fetter)  to prescribe Rx. Instructed pt to call PCP for refill. Pt agreeable.  Routing to provider for final review. Patient is agreeable to disposition. Will close encounter.

## 2019-08-26 NOTE — Telephone Encounter (Signed)
Patient returning call to [place], states she spoke with the pharmacy and would like to discuss.

## 2019-08-27 MED FILL — ZOLPIDEM TARTRATE 5 MG TAB: 5 | 15 days supply | Qty: 15 | Fill #0

## 2019-09-22 MED FILL — LOSARTAN POTASSIUM 100 MG T: 100 | 30 days supply | Qty: 30 | Fill #3

## 2019-09-28 MED FILL — HYDROCHLOROTHIAZIDE 25 MG T: 25 | 90 days supply | Qty: 90 | Fill #0

## 2019-10-04 DIAGNOSIS — H43812 Vitreous degeneration, left eye: Secondary | ICD-10-CM | POA: Diagnosis not present

## 2019-10-08 ENCOUNTER — Ambulatory Visit: Payer: 59 | Attending: Internal Medicine

## 2019-10-08 DIAGNOSIS — Z20828 Contact with and (suspected) exposure to other viral communicable diseases: Secondary | ICD-10-CM | POA: Diagnosis not present

## 2019-10-08 DIAGNOSIS — Z20822 Contact with and (suspected) exposure to covid-19: Secondary | ICD-10-CM

## 2019-10-09 LAB — NOVEL CORONAVIRUS, NAA: SARS-CoV-2, NAA: NOT DETECTED

## 2019-10-18 MED FILL — LOSARTAN POTASSIUM 100 MG T: 100 | 30 days supply | Qty: 30 | Fill #4

## 2019-11-17 MED FILL — LOSARTAN POTASSIUM 100 MG T: 100 | 30 days supply | Qty: 30 | Fill #5

## 2019-11-24 NOTE — Progress Notes (Signed)
Cardiology Office Note   Date:  11/26/2019   ID:  Rebecca Terry, DOB 10-30-1950, MRN [id number]  PCP:  Donald Prose, MD    No chief complaint on file.  Aortic insufficiency/hypertension  Wt Readings from Last 3 Encounters:  11/26/19 173 lb (78.5 kg)  06/16/19 170 lb 9.6 oz (77.4 kg)  11/17/18 170 lb (77.1 kg)       History of Present Illness: Rebecca Terry is a 69 y.o. female  with HTN and aortic insufficiency.  She works in the operating room.  She was having trouble losing weight and was considering intermittent fasting at the visit in 2020.  2015 echo: Left ventricle: The cavity size was normal. Wall thickness was normal. Systolic function was normal. The estimated ejection fraction was in the range of 55% to 60%. Wall motion was normal; there were no regional wall motion abnormalities. Doppler parameters are consistent with abnormal left ventricular relaxation (grade 1 diastolic dysfunction). The E/e&' ratio is between 8-15, suggesting indeterminate LV filling pressure. - Aortic valve: Structurally normal valve. Trileaflet. There was mild regurgitation. - Mitral valve: Mildly thickened leaflets . There was mild regurgitation. - Left atrium: The atrium was normal in size.  Impressions:  - Compared to the prior echo in 2012, there is persistent mild AI and mild MR. The LVEF has improved to 55-60%.  Since the last visit.  She has gone to part time at work.  She has been vaccinated.    Denies : Chest pain. Dizziness. Leg edema. Nitroglycerin use. Orthopnea. Palpitations. Paroxysmal nocturnal dyspnea. Shortness of breath. Syncope.   She had been walking regularly up until a weeks ago when the weather got cold.   BP at home is typically controlled. Usually < 140/90.  Past Medical History:  Diagnosis Date  . Abnormal Pap smear of cervix    09/2013 Ascus:Pos HR HPV, 03/2014 Ascus:Pos HR HPV;colpo 05/2014 LGSIL of cx and VIN 1of perianal  region, 08-09-16 neg HPV HR +  . Aortic valve disorders   . Arthritis   . Diverticulosis   . Elevated bilirubin 08/09/2016   level 1.4  . Essential hypertension, benign   . Fibroid    posterior  . Herpes genitalis in women   . History of MRSA infection 07/2015   culture positive [facility]  . Hypertension   . Osteopenia 2018   hip and spine  . Primary localized osteoarthritis of left knee   . Rectocele   . Urinary incontinence     Past Surgical History:  Procedure Laterality Date  . arthroscopic knee Left 2001  . BILATERAL OOPHORECTOMY  2010   Dr. Quincy Simmonds  . CARPAL TUNNEL RELEASE Bilateral   . COLONOSCOPY    . DIAGNOSTIC LAPAROSCOPY    . PELVIC LAPAROSCOPY     x2--ectopics  . TOTAL KNEE ARTHROPLASTY Left 12/11/2015   Procedure: LEFT TOTAL KNEE ARTHROPLASTY;  Surgeon: Elsie Saas, MD;  Location: [facility];  Service: Orthopedics;  Laterality: Left;     Current Outpatient Medications  Medication Sig Dispense Refill  . clobetasol cream (TEMOVATE) [id number] % Apply 1 application topically daily as needed.   1  . hydrochlorothiazide (HYDRODIURIL) 25 MG tablet Take 25 mg by mouth daily.  1  . losartan (COZAAR) 100 MG tablet Take 100 mg by mouth daily.    . Magnesium 400 MG TABS Take 1 tablet by mouth daily.    . Multiple Vitamin (MULTIVITAMIN) tablet Take 1 tablet by mouth daily.    Marland Kitchen  TURMERIC PO Take 1 tablet by mouth daily.    Marland Kitchen zolpidem (AMBIEN) 5 MG tablet Take 1 tablet by mouth as needed.     No current facility-administered medications for this visit.    Allergies:   Lisinopril, Adhesive [tape], Hibiclens [chlorhexidine gluconate], Latex, Nickel, and Penicillins    Social History:  The patient  reports that she quit smoking about 26 years ago. Her smoking use included cigarettes. She has a 10.00 pack-year smoking history. She has never used smokeless tobacco. She reports current alcohol use of about 5.0 standard drinks of alcohol per week. She reports that she does not use  drugs.   Family History:  The patient's family history includes Asthma in her mother; Congestive Heart Failure in her father; Heart attack in her father; Multiple sclerosis in her father.    ROS:  Please see the history of present illness.   Otherwise, review of systems are positive for difficulty losing weight.   All other systems are reviewed and negative.    PHYSICAL EXAM: VS:  BP (!) 144/80   Pulse 66   Ht 5\' 3"  (1.6 m)   Wt 173 lb (78.5 kg)   LMP 10/21/1992   SpO2 98%   BMI 30.65 kg/m  , BMI Body mass index is 30.65 kg/m. GEN: Well nourished, well developed, in no acute distress  HEENT: normal  Neck: no JVD, carotid bruits, or masses Cardiac: RRR; no murmurs, rubs, or gallops,no edema  Respiratory:  clear to auscultation bilaterally, normal work of breathing GI: soft, nontender, nondistended, + BS MS: small prominence in left AC area  Skin: warm and dry, no rash Neuro:  Strength and sensation are intact Psych: euthymic mood, full affect   EKG:   The ekg ordered today demonstrates NSR, nonspecific ST changes   Recent Labs: 06/16/2019: ALT 17; BUN 13; Creatinine, Ser 0.83; Hemoglobin 13.5; Platelets 262; Potassium 3.9; Sodium 139   Lipid Panel    Component Value Date/Time   CHOL 213 (H) 06/16/2019 1352   TRIG 137 06/16/2019 1352   HDL 63 06/16/2019 1352   CHOLHDL 3.4 06/16/2019 1352   CHOLHDL 3.0 08/09/2016 1500   VLDL 22 08/09/2016 1500   LDLCALC 123 (H) 06/16/2019 1352     Other studies Reviewed: Additional studies/ records that were reviewed today with results demonstrating: Echo as noted above.  Lipids as noted above   ASSESSMENT AND PLAN:  1. Aortic insufficiency: Continue blood pressure management to minimize valvular regurgitation.  No signs of heart failure.  Will check echo given that it has been > 5 years.  2. Hypertension: Continue to minimize salt in her diet.  Whole food, plant-based diet recommended.  Restart regular exercise. Borderline today.   She will continue to check.  If BP increases, would add amlodipine.  3. Obesity: We spoke about whole food, plant based diet. BMI just over 30.  Should improve as she gets back to regular exercise.  She eats a healthy diet.    Current medicines are reviewed at length with the patient today.  The patient concerns regarding her medicines were addressed.  The following changes have been made:  No change  Labs/ tests ordered today include: echo No orders of the defined types were placed in this encounter.   Recommend 150 minutes/week of aerobic exercise Low fat, low carb, high fiber diet recommended  Disposition:   FU in 1 year   Signed, Larae Grooms, MD  11/26/2019 8:39 AM    [facility] [address], [place]  [address] Phone: [phone]; Fax: [phone]

## 2019-11-26 ENCOUNTER — Encounter: Payer: Self-pay | Admitting: Interventional Cardiology

## 2019-11-26 ENCOUNTER — Other Ambulatory Visit: Payer: Self-pay

## 2019-11-26 ENCOUNTER — Ambulatory Visit: Payer: PPO | Admitting: Interventional Cardiology

## 2019-11-26 VITALS — BP 144/80 | HR 66 | Ht 63.0 in | Wt 173.0 lb

## 2019-11-26 DIAGNOSIS — I10 Essential (primary) hypertension: Secondary | ICD-10-CM

## 2019-11-26 DIAGNOSIS — I359 Nonrheumatic aortic valve disorder, unspecified: Secondary | ICD-10-CM

## 2019-11-26 DIAGNOSIS — E669 Obesity, unspecified: Secondary | ICD-10-CM | POA: Diagnosis not present

## 2019-11-26 NOTE — Patient Instructions (Addendum)
Medication Instructions:  Your physician recommends that you continue on your current medications as directed. Please refer to the Current Medication list given to you today.  *If you need a refill on your cardiac medications before your next appointment, please call your pharmacy*  Lab Work: None ordered  If you have labs (blood work) drawn today and your tests are completely normal, you will receive your results only by: Marland Kitchen MyChart Message (if you have MyChart) OR . A paper copy in the mail If you have any lab test that is abnormal or we need to change your treatment, we will call you to review the results.  Testing/Procedures: Your physician has requested that you have an echocardiogram. Echocardiography is a painless test that uses sound waves to create images of your heart. It provides your doctor with information about the size and shape of your heart and how well your heart's chambers and valves are working. This procedure takes approximately one hour. There are no restrictions for this procedure.   Follow-Up: At [facility], you and your health needs are our priority.  As part of our continuing mission to provide you with exceptional heart care, we have created designated Provider Care Teams.  These Care Teams include your primary Cardiologist (physician) and Advanced Practice Providers (APPs -  Physician Assistants and Nurse Practitioners) who all work together to provide you with the care you need, when you need it.  Your next appointment:   12 month(s)  The format for your next appointment:   In Person  Provider:   You may see Larae Grooms, MD or one of the following Advanced Practice Providers on your designated Care Team:    Melina Copa, PA-C  Ermalinda Barrios, PA-C   Other Instructions

## 2019-12-10 ENCOUNTER — Other Ambulatory Visit (HOSPITAL_COMMUNITY): Payer: PPO

## 2019-12-10 ENCOUNTER — Encounter (HOSPITAL_COMMUNITY): Payer: Self-pay

## 2019-12-17 MED FILL — LOSARTAN POTASSIUM 100 MG T: 100 | 90 days supply | Qty: 90 | Fill #0

## 2019-12-21 MED FILL — HYDROCHLOROTHIAZIDE 25 MG T: 25 | 90 days supply | Qty: 90 | Fill #0

## 2019-12-30 ENCOUNTER — Ambulatory Visit (HOSPITAL_COMMUNITY): Payer: PPO | Attending: Internal Medicine

## 2019-12-30 ENCOUNTER — Other Ambulatory Visit: Payer: Self-pay

## 2019-12-30 DIAGNOSIS — I10 Essential (primary) hypertension: Secondary | ICD-10-CM | POA: Insufficient documentation

## 2019-12-30 DIAGNOSIS — I359 Nonrheumatic aortic valve disorder, unspecified: Secondary | ICD-10-CM | POA: Diagnosis not present

## 2019-12-31 ENCOUNTER — Telehealth: Payer: Self-pay | Admitting: Interventional Cardiology

## 2019-12-31 NOTE — Telephone Encounter (Signed)
  Pt is calling in regards with her echo results  Please call

## 2019-12-31 NOTE — Telephone Encounter (Signed)
Drue Novel I, RN  12/31/2019 1:49 PM EST    The patient has been notified of the result and verbalized understanding. All questions (if any) were answered. Cleon Gustin, RN 12/31/2019 1:49 PM    Jettie Booze, MD  12/31/2019 1:39 PM EST    Low normal LV function. Normal LV size. Mild AI. Continue BP control.

## 2020-02-11 DIAGNOSIS — H2513 Age-related nuclear cataract, bilateral: Secondary | ICD-10-CM | POA: Diagnosis not present

## 2020-02-11 DIAGNOSIS — H524 Presbyopia: Secondary | ICD-10-CM | POA: Diagnosis not present

## 2020-02-16 ENCOUNTER — Telehealth: Payer: Self-pay | Admitting: Obstetrics and Gynecology

## 2020-02-16 ENCOUNTER — Ambulatory Visit: Payer: PPO | Admitting: Obstetrics and Gynecology

## 2020-02-16 ENCOUNTER — Other Ambulatory Visit: Payer: Self-pay

## 2020-02-16 ENCOUNTER — Encounter: Payer: Self-pay | Admitting: Obstetrics and Gynecology

## 2020-02-16 VITALS — BP 130/82 | HR 76 | Temp 97.6°F | Ht 63.0 in | Wt 166.0 lb

## 2020-02-16 DIAGNOSIS — R8290 Unspecified abnormal findings in urine: Secondary | ICD-10-CM | POA: Diagnosis not present

## 2020-02-16 DIAGNOSIS — N3090 Cystitis, unspecified without hematuria: Secondary | ICD-10-CM

## 2020-02-16 DIAGNOSIS — R350 Frequency of micturition: Secondary | ICD-10-CM | POA: Diagnosis not present

## 2020-02-16 LAB — POCT URINALYSIS DIPSTICK
Bilirubin, UA: NEGATIVE
Glucose, UA: NEGATIVE
Ketones, UA: NEGATIVE
Nitrite, UA: POSITIVE
Protein, UA: NEGATIVE
Urobilinogen, UA: 0.2 E.U./dL
pH, UA: 7 (ref 5.0–8.0)

## 2020-02-16 MED ORDER — SULFAMETHOXAZOLE-TRIMETHOPRIM 800-160 MG PO TABS
1.0000 | ORAL_TABLET | Freq: Two times a day (BID) | ORAL | 0 refills | Status: DC
Start: 2020-02-16 — End: 2020-10-09

## 2020-02-16 MED FILL — SULFAMETHOXAZOLE-TMP DS TAB: 800-160 | 3 days supply | Qty: 6 | Fill #0

## 2020-02-16 NOTE — Telephone Encounter (Signed)
Reviewed with Dr Quincy Simmonds. Pt ok to come for work-in visit today with Dr Quincy Simmonds to give urine sample. Pt agreeable and verbalized understanding. Pt has work-in OV today at 3:30 pm. CPS Neg.   Routing to Dr Quincy Simmonds for review.  Encounter closed.

## 2020-02-16 NOTE — Patient Instructions (Signed)
Urinary Tract Infection, Adult A urinary tract infection (UTI) is an infection of any part of the urinary tract. The urinary tract includes:  The kidneys.  The ureters.  The bladder.  The urethra. These organs make, store, and get rid of pee (urine) in the body. What are the causes? This is caused by germs (bacteria) in your genital area. These germs grow and cause swelling (inflammation) of your urinary tract. What increases the risk? You are more likely to develop this condition if:  You have a small, thin tube (catheter) to drain pee.  You cannot control when you pee or poop (incontinence).  You are female, and: ? You use these methods to prevent pregnancy:  A medicine that kills sperm (spermicide).  A device that blocks sperm (diaphragm). ? You have low levels of a female hormone (estrogen). ? You are pregnant.  You have genes that add to your risk.  You are sexually active.  You take antibiotic medicines.  You have trouble peeing because of: ? A prostate that is bigger than normal, if you are female. ? A blockage in the part of your body that drains pee from the bladder (urethra). ? A kidney stone. ? A nerve condition that affects your bladder (neurogenic bladder). ? Not getting enough to drink. ? Not peeing often enough.  You have other conditions, such as: ? Diabetes. ? A weak disease-fighting system (immune system). ? Sickle cell disease. ? Gout. ? Injury of the spine. What are the signs or symptoms? Symptoms of this condition include:  Needing to pee right away (urgently).  Peeing often.  Peeing small amounts often.  Pain or burning when peeing.  Blood in the pee.  Pee that smells bad or not like normal.  Trouble peeing.  Pee that is cloudy.  Fluid coming from the vagina, if you are female.  Pain in the belly or lower back. Other symptoms include:  Throwing up (vomiting).  No urge to eat.  Feeling mixed up (confused).  Being tired  and grouchy (irritable).  A fever.  Watery poop (diarrhea). How is this treated? This condition may be treated with:  Antibiotic medicine.  Other medicines.  Drinking enough water. Follow these instructions at home:  Medicines  Take over-the-counter and prescription medicines only as told by your doctor.  If you were prescribed an antibiotic medicine, take it as told by your doctor. Do not stop taking it even if you start to feel better. General instructions  Make sure you: ? Pee until your bladder is empty. ? Do not hold pee for a long time. ? Empty your bladder after sex. ? Wipe from front to back after pooping if you are a female. Use each tissue one time when you wipe.  Drink enough fluid to keep your pee pale yellow.  Keep all follow-up visits as told by your doctor. This is important. Contact a doctor if:  You do not get better after 1-2 days.  Your symptoms go away and then come back. Get help right away if:  You have very bad back pain.  You have very bad pain in your lower belly.  You have a fever.  You are sick to your stomach (nauseous).  You are throwing up. Summary  A urinary tract infection (UTI) is an infection of any part of the urinary tract.  This condition is caused by germs in your genital area.  There are many risk factors for a UTI. These include having a small, thin   tube to drain pee and not being able to control when you pee or poop.  Treatment includes antibiotic medicines for germs.  Drink enough fluid to keep your pee pale yellow. This information is not intended to replace advice given to you by your health care provider. Make sure you discuss any questions you have with your health care provider. Document Revised: 09/24/2018 Document Reviewed: 04/16/2018 Elsevier Patient Education  2020 [employer]  

## 2020-02-16 NOTE — Telephone Encounter (Signed)
Patient is having uti symptoms. Request that something be called in because she is going out of town tomorrow. She is aware office does not treat over phone.

## 2020-02-16 NOTE — Progress Notes (Signed)
GYNECOLOGY  VISIT   HPI: 69 y.o.   Single  Caucasian  female   [phone] with Patient's last menstrual period was 10/21/1992.   here for urinary frequency/urgency and only voiding small amounts.    No fever, shakes or chills.  No back pain.   Urine Dip: Mod.WBCs, Mod RBCs, Pos.Nitrites  GYNECOLOGIC HISTORY: Patient's last menstrual period was 10/21/1992. Contraception: PMP Menopausal hormone therapy: none Last mammogram: 08-19-19 3D/Neg/density B/BiRads1 Last pap smear: 06-16-19 Neg:Neg HR HPV, 01-05-18 Neg:Pos HR HPV, 08-09-16 Neg:Pos HR HPV         OB History    Gravida  4   Para  1   Term  1   Preterm      AB  2   Living  1     SAB      TAB  1   Ectopic  1   Multiple      Live Births                 Patient Active Problem List   Diagnosis Date Noted  . Primary localized osteoarthritis of left knee 11/27/2015  . Gilbert's syndrome   . Herpes genitalis in women   . Hypertriglyceridemia 08/24/2015  . History of MRSA infection 07/22/2015  . Aortic valve disorder   . Essential hypertension, benign   . 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  . Osteoarthritis of neck 10/28/2013  . Pain in joint, shoulder region 10/13/2013  . Hallux rigidus of right foot 10/13/2013  . Hammertoe 10/13/2013  . LATERAL EPICONDYLITIS, RIGHT 10/17/2008  . ARM PAIN, RIGHT 10/17/2008    Past Medical History:  Diagnosis Date  . 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 Aortic valve disorders   . Arthritis   . Diverticulosis   . Elevated bilirubin 08/09/2016   level 1.4  . Essential hypertension, benign   . Fibroid    posterior  . Herpes genitalis in women   . History of MRSA infection 07/2015   culture positive [facility]  . Hypertension   . Osteopenia 2018   hip and spine  . Primary localized osteoarthritis of left knee    . Rectocele   . Urinary incontinence     Past Surgical History:  Procedure Laterality Date  . arthroscopic knee Left 2001  . BILATERAL OOPHORECTOMY  2010   Dr. Quincy Simmonds  . CARPAL TUNNEL RELEASE Bilateral   . COLONOSCOPY    . DIAGNOSTIC LAPAROSCOPY    . PELVIC LAPAROSCOPY     x2--ectopics  . TOTAL KNEE ARTHROPLASTY Left 12/11/2015   Procedure: LEFT TOTAL KNEE ARTHROPLASTY;  Surgeon: Elsie Saas, MD;  Location: [facility];  Service: Orthopedics;  Laterality: Left;    Current Outpatient Medications  Medication Sig Dispense Refill  . clobetasol cream (TEMOVATE) [id number] % Apply 1 application topically daily as needed.   1  . hydrochlorothiazide (HYDRODIURIL) 25 MG tablet Take 25 mg by mouth daily.  1  . losartan (COZAAR) 100 MG tablet Take 100 mg by mouth daily.    . Magnesium 400 MG TABS Take 1 tablet by mouth daily.    . Multiple Vitamin (MULTIVITAMIN) tablet Take 1 tablet by mouth daily.    . TURMERIC PO Take 1 tablet by mouth daily.    Marland Kitchen zolpidem (AMBIEN) 5 MG tablet Take 1 tablet by mouth as  needed.    . sulfamethoxazole-trimethoprim (BACTRIM DS) 800-160 MG tablet Take 1 tablet by mouth 2 (two) times daily. One PO BID x 3 days 6 tablet 0   No current facility-administered medications for this visit.     ALLERGIES: Lisinopril, Adhesive [tape], Hibiclens [chlorhexidine gluconate], Latex, Nickel, and Penicillins  Family History  Problem Relation Age of Onset  . Asthma Mother   . Congestive Heart Failure Father   . Multiple sclerosis Father   . Heart attack Father     Social History   Socioeconomic History  . Marital status: Single    Spouse name: Not on file  . Number of children: Not on file  . Years of education: Not on file  . Highest education level: Not on file  Occupational History  . Not on file  Tobacco Use  . Smoking status: Former Smoker    Packs/day: 0.50    Years: 20.00    Pack years: 10.00    Types: Cigarettes    Quit date: 04/07/1993    Years since  quitting: 26.8  . Smokeless tobacco: Never Used  Substance and Sexual Activity  . Alcohol use: Yes    Alcohol/week: 5.0 standard drinks    Types: 5 Standard drinks or equivalent per week    Comment: 5 glasses wine/beer per week  . Drug use: No  . Sexual activity: Not Currently    Partners: Male    Birth control/protection: Post-menopausal  Other Topics Concern  . Not on file  Social History Narrative  . Not on file   Social Determinants of Health   Financial Resource Strain:   . Difficulty of Paying Living Expenses:   Food Insecurity:   . Worried About Charity fundraiser in the Last Year:   . Arboriculturist in the Last Year:   Transportation Needs:   . Film/video editor (Medical):   Marland Kitchen Lack of Transportation (Non-Medical):   Physical Activity:   . Days of Exercise per Week:   . Minutes of Exercise per Session:   Stress:   . Feeling of Stress :   Social Connections:   . Frequency of Communication with Friends and Family:   . Frequency of Social Gatherings with Friends and Family:   . Attends Religious Services:   . Active Member of Clubs or Organizations:   . Attends Archivist Meetings:   Marland Kitchen Marital Status:   Intimate Partner Violence:   . Fear of Current or Ex-Partner:   . Emotionally Abused:   Marland Kitchen Physically Abused:   . Sexually Abused:     Review of Systems  See HPI.  PHYSICAL EXAMINATION:    BP 130/82 (Cuff Size: Large)   Pulse 76 Comment: irregular--slight skip  Temp 97.6 F (36.4 C) (Temporal)   Ht 5\' 3"  (1.6 m)   Wt 166 lb (75.3 kg)   LMP 10/21/1992   BMI 29.41 kg/m     General appearance: alert, cooperative and appears stated age  ASSESSMENT  Cystitis.    PLAN  Urine micro and culture.  Bactrim DS po bid x 3 days.  AZO 100 - 200 mg po tid prn.  Hydrate well.   An After Visit Summary was printed and given to the patient.  __15____ minutes face to face time of which over 50% was spent in counseling.

## 2020-02-16 NOTE — Telephone Encounter (Signed)
AEX 12/2017 Last OV 06/16/19 for estrogen deficiency. On Rx Premarin cream   Spoke with pt. Pt states having frequency, urgency with twinges of pain when voiding, only voiding small amounts that started today. Pt denies fever, chills, back pain or vaginal discharge or odor.   Pt requesting Rx to be called in because leaving for out of town tomorrow am. Pt offered OV today as work-in for further evaluation. Pt declines.  Pt wanting to know if can drop off urine for micro and culture and not be seen.  Will review with Dr Quincy Simmonds and return call to pt. Pt agreeable.   Routing to Dr Quincy Simmonds

## 2020-02-17 LAB — URINALYSIS, MICROSCOPIC ONLY
Casts: NONE SEEN /lpf
Epithelial Cells (non renal): NONE SEEN /hpf (ref 0–10)
RBC, Urine: >30 /hpf — AB (ref 0–2)
WBC, UA: >30 /hpf — AB (ref 0–5)

## 2020-02-18 LAB — URINE CULTURE

## 2020-02-29 DIAGNOSIS — Z1159 Encounter for screening for other viral diseases: Secondary | ICD-10-CM | POA: Diagnosis not present

## 2020-03-03 DIAGNOSIS — Z1211 Encounter for screening for malignant neoplasm of colon: Secondary | ICD-10-CM | POA: Diagnosis not present

## 2020-03-03 DIAGNOSIS — K648 Other hemorrhoids: Secondary | ICD-10-CM | POA: Diagnosis not present

## 2020-03-03 DIAGNOSIS — K5730 Diverticulosis of large intestine without perforation or abscess without bleeding: Secondary | ICD-10-CM | POA: Diagnosis not present

## 2020-03-10 MED FILL — LOSARTAN POTASSIUM 100 MG T: 100 | 90 days supply | Qty: 90 | Fill #0

## 2020-05-05 MED FILL — ZOLPIDEM TARTRATE 5 MG TAB: 5 | 15 days supply | Qty: 15 | Fill #0

## 2020-06-02 ENCOUNTER — Other Ambulatory Visit (HOSPITAL_COMMUNITY): Payer: Self-pay | Admitting: Family Medicine

## 2020-06-03 MED FILL — LOSARTAN POTASSIUM 100 MG T: 100 | 90 days supply | Qty: 90 | Fill #0

## 2020-06-24 ENCOUNTER — Telehealth: Payer: PPO | Admitting: Family

## 2020-06-24 DIAGNOSIS — N390 Urinary tract infection, site not specified: Secondary | ICD-10-CM

## 2020-06-24 DIAGNOSIS — A499 Bacterial infection, unspecified: Secondary | ICD-10-CM

## 2020-06-24 MED ORDER — NITROFURANTOIN MONOHYD MACRO 100 MG PO CAPS
100.0000 mg | ORAL_CAPSULE | Freq: Two times a day (BID) | ORAL | 0 refills | Status: DC
Start: 2020-06-24 — End: 2020-10-09

## 2020-06-24 MED FILL — NITROFURANTOIN MONO-MCR 100: 100 | 5 days supply | Qty: 10 | Fill #0

## 2020-06-24 NOTE — Progress Notes (Signed)

## 2020-06-27 ENCOUNTER — Other Ambulatory Visit (HOSPITAL_COMMUNITY): Payer: Self-pay | Admitting: Family Medicine

## 2020-06-27 DIAGNOSIS — K219 Gastro-esophageal reflux disease without esophagitis: Secondary | ICD-10-CM | POA: Diagnosis not present

## 2020-06-27 DIAGNOSIS — I10 Essential (primary) hypertension: Secondary | ICD-10-CM | POA: Diagnosis not present

## 2020-06-27 DIAGNOSIS — Z1389 Encounter for screening for other disorder: Secondary | ICD-10-CM | POA: Diagnosis not present

## 2020-06-27 MED FILL — HYDROCHLOROTHIAZIDE 25 MG T: 25 | 90 days supply | Qty: 90 | Fill #0

## 2020-06-28 ENCOUNTER — Other Ambulatory Visit: Payer: Self-pay | Admitting: Family

## 2020-07-20 ENCOUNTER — Other Ambulatory Visit (HOSPITAL_COMMUNITY): Payer: Self-pay | Admitting: Physician Assistant

## 2020-07-20 DIAGNOSIS — K920 Hematemesis: Secondary | ICD-10-CM | POA: Diagnosis not present

## 2020-07-20 MED FILL — PANTOPRAZOLE SOD DR 40 MG T: 40 | 30 days supply | Qty: 60 | Fill #0

## 2020-07-26 ENCOUNTER — Other Ambulatory Visit: Payer: Self-pay | Admitting: Obstetrics and Gynecology

## 2020-07-26 DIAGNOSIS — Z1231 Encounter for screening mammogram for malignant neoplasm of breast: Secondary | ICD-10-CM

## 2020-08-02 ENCOUNTER — Ambulatory Visit: Payer: 59 | Admitting: Obstetrics and Gynecology

## 2020-08-07 ENCOUNTER — Other Ambulatory Visit: Payer: Self-pay | Admitting: Family Medicine

## 2020-08-07 ENCOUNTER — Ambulatory Visit
Admission: RE | Admit: 2020-08-07 | Discharge: 2020-08-07 | Disposition: A | Discharge status: Home / Self Care | Payer: PPO | Source: Ambulatory Visit | Attending: Family Medicine | Admitting: Family Medicine

## 2020-08-07 ENCOUNTER — Other Ambulatory Visit (HOSPITAL_COMMUNITY): Payer: Self-pay | Admitting: Family Medicine

## 2020-08-07 DIAGNOSIS — R109 Unspecified abdominal pain: Secondary | ICD-10-CM | POA: Diagnosis not present

## 2020-08-07 DIAGNOSIS — Z8719 Personal history of other diseases of the digestive system: Secondary | ICD-10-CM

## 2020-08-07 DIAGNOSIS — M5136 Other intervertebral disc degeneration, lumbar region: Secondary | ICD-10-CM | POA: Diagnosis not present

## 2020-08-07 DIAGNOSIS — R1030 Lower abdominal pain, unspecified: Secondary | ICD-10-CM | POA: Diagnosis not present

## 2020-08-07 DIAGNOSIS — I700 Atherosclerosis of aorta: Secondary | ICD-10-CM | POA: Diagnosis not present

## 2020-08-07 DIAGNOSIS — M5137 Other intervertebral disc degeneration, lumbosacral region: Secondary | ICD-10-CM | POA: Diagnosis not present

## 2020-08-07 MED ORDER — IOPAMIDOL (ISOVUE-300) INJECTION 61%
100.0000 mL | Freq: Once | INTRAVENOUS | Status: AC | PRN
  Administered 2020-08-07: 100 mL via INTRAVENOUS

## 2020-08-07 MED FILL — SULFAMETHOXAZOLE-TMP DS TAB: 800-160 | 10 days supply | Qty: 20 | Fill #0

## 2020-08-07 MED FILL — METRONIDAZOLE 500 MG TABS: 500 | 10 days supply | Qty: 30 | Fill #0

## 2020-08-08 ENCOUNTER — Other Ambulatory Visit: Payer: Self-pay | Admitting: Family Medicine

## 2020-08-08 DIAGNOSIS — R1030 Lower abdominal pain, unspecified: Secondary | ICD-10-CM

## 2020-08-14 MED FILL — PANTOPRAZOLE SOD DR 40 MG T: 40 | 30 days supply | Qty: 60 | Fill #1

## 2020-08-22 ENCOUNTER — Other Ambulatory Visit: Payer: Self-pay

## 2020-08-22 ENCOUNTER — Ambulatory Visit
Admission: RE | Admit: 2020-08-22 | Discharge: 2020-08-22 | Disposition: A | Discharge status: Home / Self Care | Payer: PPO | Source: Ambulatory Visit

## 2020-08-22 DIAGNOSIS — Z1231 Encounter for screening mammogram for malignant neoplasm of breast: Secondary | ICD-10-CM | POA: Diagnosis not present

## 2020-08-23 MED FILL — LOSARTAN POTASSIUM 100 MG T: 100 | 90 days supply | Qty: 90 | Fill #1

## 2020-08-28 ENCOUNTER — Other Ambulatory Visit (HOSPITAL_COMMUNITY): Payer: Self-pay | Admitting: Orthopedic Surgery

## 2020-08-28 DIAGNOSIS — M25512 Pain in left shoulder: Secondary | ICD-10-CM | POA: Diagnosis not present

## 2020-08-28 DIAGNOSIS — M542 Cervicalgia: Secondary | ICD-10-CM | POA: Diagnosis not present

## 2020-08-28 MED FILL — predniSONE 10 MG TABS: 10 | 10 days supply | Qty: 21 | Fill #0

## 2020-08-30 DIAGNOSIS — Z1159 Encounter for screening for other viral diseases: Secondary | ICD-10-CM | POA: Diagnosis not present

## 2020-09-04 DIAGNOSIS — R12 Heartburn: Secondary | ICD-10-CM | POA: Diagnosis not present

## 2020-09-04 DIAGNOSIS — K920 Hematemesis: Secondary | ICD-10-CM | POA: Diagnosis not present

## 2020-09-04 DIAGNOSIS — K3189 Other diseases of stomach and duodenum: Secondary | ICD-10-CM | POA: Diagnosis not present

## 2020-09-04 DIAGNOSIS — K2930 Chronic superficial gastritis without bleeding: Secondary | ICD-10-CM | POA: Diagnosis not present

## 2020-09-07 DIAGNOSIS — K2930 Chronic superficial gastritis without bleeding: Secondary | ICD-10-CM | POA: Diagnosis not present

## 2020-09-11 MED FILL — PANTOPRAZOLE SOD DR 40 MG T: 40 | 30 days supply | Qty: 60 | Fill #1

## 2020-09-20 MED FILL — HYDROCHLOROTHIAZIDE 25 MG T: 25 | 90 days supply | Qty: 90 | Fill #1

## 2020-10-09 ENCOUNTER — Encounter: Payer: Self-pay | Admitting: Obstetrics and Gynecology

## 2020-10-09 ENCOUNTER — Ambulatory Visit: Payer: PPO | Admitting: Obstetrics and Gynecology

## 2020-10-09 ENCOUNTER — Other Ambulatory Visit (HOSPITAL_COMMUNITY)
Admission: RE | Admit: 2020-10-09 | Discharge: 2020-10-09 | Disposition: A | Discharge status: Home / Self Care | Payer: PPO | Source: Ambulatory Visit | Attending: Obstetrics and Gynecology | Admitting: Obstetrics and Gynecology

## 2020-10-09 ENCOUNTER — Other Ambulatory Visit: Payer: Self-pay

## 2020-10-09 VITALS — BP 152/80 | HR 70 | Resp 14 | Ht 63.0 in | Wt 164.0 lb

## 2020-10-09 DIAGNOSIS — E7800 Pure hypercholesterolemia, unspecified: Secondary | ICD-10-CM | POA: Diagnosis not present

## 2020-10-09 DIAGNOSIS — R87610 Atypical squamous cells of undetermined significance on cytologic smear of cervix (ASC-US): Secondary | ICD-10-CM | POA: Insufficient documentation

## 2020-10-09 DIAGNOSIS — Z87410 Personal history of cervical dysplasia: Secondary | ICD-10-CM

## 2020-10-09 DIAGNOSIS — Z124 Encounter for screening for malignant neoplasm of cervix: Secondary | ICD-10-CM

## 2020-10-09 DIAGNOSIS — Z8619 Personal history of other infectious and parasitic diseases: Secondary | ICD-10-CM | POA: Diagnosis not present

## 2020-10-09 DIAGNOSIS — N816 Rectocele: Secondary | ICD-10-CM

## 2020-10-09 DIAGNOSIS — N952 Postmenopausal atrophic vaginitis: Secondary | ICD-10-CM

## 2020-10-09 DIAGNOSIS — R87810 Cervical high risk human papillomavirus (HPV) DNA test positive: Secondary | ICD-10-CM | POA: Diagnosis not present

## 2020-10-09 DIAGNOSIS — Z1151 Encounter for screening for human papillomavirus (HPV): Secondary | ICD-10-CM | POA: Insufficient documentation

## 2020-10-09 MED ORDER — ESTRADIOL 0.1 MG/GM VA CREA: TOPICAL_CREAM | VAGINAL | 1 refills | Status: DC

## 2020-10-09 MED FILL — ESTRADIOL 0.1 MG/GM CREA: 0.1 | 90 days supply | Qty: 43 | Fill #0

## 2020-10-09 NOTE — Progress Notes (Signed)
GYNECOLOGY  VISIT   HPI: 69 y.o.   Single  Caucasian  female   [phone] with Patient's last menstrual period was 10/21/1992.   here for a problem visit and for pelvic exam, pap and breast check.  Patient is followed for multiple issues including abnormal paps, osteopenia, rectocele, and vaginal atrophy.  Patient has history of positive HR HPV and LGSIL of the cervix.   Had some gastritis this summer. Had an upper GI with [facility].  Now resolved.   Had constipation with Pantoprazole.  Had to do splinting.  Bladder control is stable.  No urinary incontinence.  Up twice a night to void.   Stopped using vaginal estrogen cream regularly.  Needs refills to treat atrophy.    Sees cardiology in February.  Wants a cholesterol check, CMP, and CBC drawn today. Hx elevated LDL cholesterol.  Joined [employer].   GYNECOLOGIC HISTORY: Patient's last menstrual period was 10/21/1992. Contraception:  PMP Menopausal hormone therapy: estrogen cream Last mammogram: 08-22-20 3D/Neg/density C/BiRads1 Last pap smear:  06-16-19 Neg:Neg HR HPV, 01-05-18 Neg:Pos HR HPV, 08-09-16 Neg:Pos HR HPV        OB History    Gravida  4   Para  1   Term  1   Preterm      AB  2   Living  1     SAB      IAB  1   Ectopic  1   Multiple      Live Births                 Patient Active Problem List   Diagnosis Date Noted  . Primary localized osteoarthritis of left knee 11/27/2015  . Gilbert's syndrome   . Herpes genitalis in women   . Hypertriglyceridemia 08/24/2015  . History of MRSA infection 07/22/2015  . Aortic valve disorder   . Essential hypertension, benign   . 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  . Osteoarthritis of neck 10/28/2013  . Pain in joint, shoulder region 10/13/2013  . Hallux rigidus of right foot 10/13/2013  . Hammertoe 10/13/2013  . LATERAL EPICONDYLITIS, RIGHT 10/17/2008  . ARM PAIN, RIGHT 10/17/2008    Past Medical History:   Diagnosis Date  . 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 Aortic valve disorders   . Arthritis   . Diverticulosis   . Elevated bilirubin 08/09/2016   level 1.4  . Essential hypertension, benign   . Fibroid    posterior  . Herpes genitalis in women   . History of MRSA infection 07/2015   culture positive [facility]  . Hypertension   . Osteopenia 2018   hip and spine  . Primary localized osteoarthritis of left knee   . Rectocele   . Urinary incontinence     Past Surgical History:  Procedure Laterality Date  . arthroscopic knee Left 2001  . BILATERAL OOPHORECTOMY  2010   Dr. Quincy Simmonds  . CARPAL TUNNEL RELEASE Bilateral   . COLONOSCOPY    . DIAGNOSTIC LAPAROSCOPY    . PELVIC LAPAROSCOPY     x2--ectopics  . TOTAL KNEE ARTHROPLASTY Left 12/11/2015   Procedure: LEFT TOTAL KNEE ARTHROPLASTY;  Surgeon: Elsie Saas, MD;  Location: [facility];  Service: Orthopedics;  Laterality: Left;    Current Outpatient Medications  Medication Sig Dispense Refill  . clobetasol  cream (TEMOVATE) 1.01 % Apply 1 application topically daily as needed.   1  . estradiol (ESTRACE) 0.1 MG/GM vaginal cream See admin instructions.    . hydrochlorothiazide (HYDRODIURIL) 25 MG tablet Take 25 mg by mouth daily.  1  . losartan (COZAAR) 100 MG tablet Take 100 mg by mouth daily.    . Magnesium 400 MG TABS Take 1 tablet by mouth daily.    . Multiple Vitamin (MULTIVITAMIN) tablet Take 1 tablet by mouth daily.    . pantoprazole (PROTONIX) 40 MG tablet Take 40 mg by mouth as needed.    . TURMERIC PO Take 1 tablet by mouth daily.    Marland Kitchen zolpidem (AMBIEN) 5 MG tablet Take 1 tablet by mouth as needed.     No current facility-administered medications for this visit.     ALLERGIES: Lisinopril, Povidone iodine, Adhesive [tape], Hibiclens [chlorhexidine gluconate], Latex, Nickel, and  Penicillins  Family History  Problem Relation Age of Onset  . Asthma Mother   . Congestive Heart Failure Father   . Multiple sclerosis Father   . Heart attack Father     Social History   Socioeconomic History  . Marital status: Single    Spouse name: Not on file  . Number of children: Not on file  . Years of education: Not on file  . Highest education level: Not on file  Occupational History  . Not on file  Tobacco Use  . Smoking status: Former Smoker    Packs/day: 0.50    Years: 20.00    Pack years: 10.00    Types: Cigarettes    Quit date: 04/07/1993    Years since quitting: 27.5  . Smokeless tobacco: Never Used  Vaping Use  . Vaping Use: Never used  Substance and Sexual Activity  . Alcohol use: Yes    Alcohol/week: 5.0 standard drinks    Types: 5 Standard drinks or equivalent per week    Comment: 5 glasses wine/beer per week  . Drug use: No  . Sexual activity: Not Currently    Partners: Male    Birth control/protection: Post-menopausal  Other Topics Concern  . Not on file  Social History Narrative  . Not on file   Social Determinants of Health   Financial Resource Strain: Not on file  Food Insecurity: Not on file  Transportation Needs: Not on file  Physical Activity: Not on file  Stress: Not on file  Social Connections: Not on file  Intimate Partner Violence: Not on file    Review of Systems  All other systems reviewed and are negative.   PHYSICAL EXAMINATION:    BP (!) 152/80   Pulse 70   Resp 14   Ht 5\' 3"  (1.6 m)   Wt 164 lb (74.4 kg)   LMP 10/21/1992   BMI 29.05 kg/m     General appearance: alert, cooperative and appears stated age Head: Normocephalic, without obvious abnormality, atraumatic Neck: no adenopathy, supple, symmetrical, trachea midline and thyroid normal to inspection and palpation Lungs: clear to auscultation bilaterally Breasts: normal appearance, no masses or tenderness, No nipple retraction or dimpling, No nipple  discharge or bleeding, No axillary or supraclavicular adenopathy Heart: regular rate and rhythm Abdomen: soft, non-tender, no masses,  no organomegaly Extremities: extremities normal, atraumatic, no cyanosis or edema Skin: Skin color, texture, turgor normal. No rashes or lesions Lymph nodes: Cervical, supraclavicular, and axillary nodes normal. No abnormal inguinal nodes palpated Neurologic: Grossly normal  Pelvic: External genitalia:  no lesions  Urethra:  normal appearing urethra with no masses, tenderness or lesions              Bartholins and Skenes: normal                 Vagina: normal appearing vagina with normal color and discharge, no lesions.  First degree rectocele.  Good bladder support.               Cervix: no lesions.  Bleeds with cytobrush.                 Bimanual Exam:  Uterus:  normal size, contour, position, consistency, mobility, non-tender              Adnexa: no mass, fullness, tenderness              Rectal exam: Yes.  .  Confirms.              Anus:  normal sphincter tone, no lesions  Chaperone was present for exam.  ASSESSMENT  Hx LGSIL.  VIN I.  Osteopenia.  Hx fibroid.  Rectocele.  Stable. Mild aortic insufficiency.  Elevated LDL cholesterol. Gilbert's disease. Vaginal atrophy.   PLAN  Vaginal Estrace refill.  Yearly mammogram recommended. Pap and high risk HPV. Cholesterol, CMP, CBC.  BMD in 2022. Continue MVI.  Exercise plan supported. Fu yearly and prn.

## 2020-10-09 NOTE — Patient Instructions (Signed)

## 2020-10-10 DIAGNOSIS — R87610 Atypical squamous cells of undetermined significance on cytologic smear of cervix (ASC-US): Secondary | ICD-10-CM | POA: Diagnosis not present

## 2020-10-10 LAB — CBC
Hematocrit: 40.9 % (ref 34.0–46.6)
Hemoglobin: 13.4 g/dL (ref 11.1–15.9)
MCH: 31.2 pg (ref 26.6–33.0)
MCHC: 32.8 g/dL (ref 31.5–35.7)
MCV: 95 fL (ref 79–97)
Platelets: 302 10*3/uL (ref 150–450)
RBC: 4.3 x10E6/uL (ref 3.77–5.28)
RDW: 13.1 % (ref 11.7–15.4)
WBC: 8.1 10*3/uL (ref 3.4–10.8)

## 2020-10-10 LAB — COMPREHENSIVE METABOLIC PANEL
ALT: 18 IU/L (ref 0–32)
AST: 17 IU/L (ref 0–40)
Albumin/Globulin Ratio: 1.6 (ref 1.2–2.2)
Albumin: 4.4 g/dL (ref 3.8–4.8)
Alkaline Phosphatase: 91 IU/L (ref 44–121)
BUN/Creatinine Ratio: 18 (ref 12–28)
BUN: 14 mg/dL (ref 8–27)
Bilirubin Total: 0.9 mg/dL (ref 0.0–1.2)
CO2: 27 mmol/L (ref 20–29)
Calcium: 10 mg/dL (ref 8.7–10.3)
Chloride: 99 mmol/L (ref 96–106)
Creatinine, Ser: 0.78 mg/dL (ref 0.57–1.00)
GFR calc Af Amer: 90 mL/min/{1.73_m2} (ref >59)
GFR calc non Af Amer: 78 mL/min/{1.73_m2} (ref >59)
Globulin, Total: 2.8 g/dL (ref 1.5–4.5)
Glucose: 80 mg/dL (ref 65–99)
Potassium: 3.7 mmol/L (ref 3.5–5.2)
Sodium: 139 mmol/L (ref 134–144)
Total Protein: 7.2 g/dL (ref 6.0–8.5)

## 2020-10-11 LAB — CYTOLOGY - PAP
Comment: NEGATIVE
Diagnosis: UNDETERMINED — AB
High risk HPV: POSITIVE — AB

## 2020-10-11 LAB — LIPID PANEL W/O CHOL/HDL RATIO
Cholesterol, Total: 205 mg/dL — ABNORMAL HIGH (ref 100–199)
HDL: 66 mg/dL (ref >39)
LDL Chol Calc (NIH): 115 mg/dL — ABNORMAL HIGH (ref 0–99)
Triglycerides: 138 mg/dL (ref 0–149)
VLDL Cholesterol Cal: 24 mg/dL (ref 5–40)

## 2020-10-11 LAB — SPECIMEN STATUS REPORT

## 2020-10-17 ENCOUNTER — Other Ambulatory Visit: Payer: Self-pay | Admitting: *Deleted

## 2020-10-17 ENCOUNTER — Telehealth: Payer: Self-pay | Admitting: *Deleted

## 2020-10-17 DIAGNOSIS — R87610 Atypical squamous cells of undetermined significance on cytologic smear of cervix (ASC-US): Secondary | ICD-10-CM

## 2020-10-17 DIAGNOSIS — R87810 Cervical high risk human papillomavirus (HPV) DNA test positive: Secondary | ICD-10-CM

## 2020-10-17 MED FILL — ZOLPIDEM TARTRATE 5 MG TABS: 5 | 15 days supply | Qty: 15 | Fill #0

## 2020-10-17 NOTE — Telephone Encounter (Signed)
-----   Message from Patton Salles, MD sent at 10/15/2020  8:00 AM EST ----- Results to patient through My Chart. Please schedule colposcopy with me.  Hi Rebecca Terry,  I did review your pap showing atypical cells and positive high risk HPV.  I will have the office call you to schedule a colposcopy with me.  Your cholesterol panel looks stable with slightly elevated total and LDL cholesterol.   I hope you are having a good Christmas weekend!  [employer]

## 2020-10-17 NOTE — Telephone Encounter (Signed)
Leda Min, RN  10/17/2020 11:09 AM EST Back to Top     Left message to call Noreene Larsson, RN at [facility] [phone].   Order placed for colposcopy for precert.   Seen by patient Rebecca Terry on 10/15/2020 8:01 AM

## 2020-10-17 NOTE — Telephone Encounter (Signed)
Spoke with patient.  Patient is familiar with colpo procedure.  Colpo scheduled for 11/01/19 at 11am w/ Dr. Edward Jolly.  Advised to take Motrin 800 mg with food and water one hour before procedure. Patient verbalizes understanding and is agreeable.   Routing to [employer].

## 2020-10-31 ENCOUNTER — Encounter: Payer: Self-pay | Admitting: Obstetrics and Gynecology

## 2020-10-31 ENCOUNTER — Ambulatory Visit: Payer: PPO | Admitting: Obstetrics and Gynecology

## 2020-10-31 ENCOUNTER — Other Ambulatory Visit: Payer: Self-pay

## 2020-10-31 ENCOUNTER — Other Ambulatory Visit (HOSPITAL_COMMUNITY)
Admission: RE | Admit: 2020-10-31 | Discharge: 2020-10-31 | Disposition: A | Discharge status: Home / Self Care | Payer: PPO | Source: Ambulatory Visit | Attending: Obstetrics and Gynecology | Admitting: Obstetrics and Gynecology

## 2020-10-31 DIAGNOSIS — R87810 Cervical high risk human papillomavirus (HPV) DNA test positive: Secondary | ICD-10-CM | POA: Diagnosis not present

## 2020-10-31 DIAGNOSIS — R87610 Atypical squamous cells of undetermined significance on cytologic smear of cervix (ASC-US): Secondary | ICD-10-CM | POA: Diagnosis not present

## 2020-10-31 DIAGNOSIS — N870 Mild cervical dysplasia: Secondary | ICD-10-CM | POA: Diagnosis not present

## 2020-10-31 DIAGNOSIS — N952 Postmenopausal atrophic vaginitis: Secondary | ICD-10-CM

## 2020-10-31 DIAGNOSIS — R87612 Low grade squamous intraepithelial lesion on cytologic smear of cervix (LGSIL): Secondary | ICD-10-CM | POA: Insufficient documentation

## 2020-10-31 DIAGNOSIS — B079 Viral wart, unspecified: Secondary | ICD-10-CM | POA: Diagnosis not present

## 2020-10-31 MED ORDER — ESTRADIOL 0.1 MG/GM VA CREA: TOPICAL_CREAM | VAGINAL | 1 refills | Status: DC

## 2020-10-31 NOTE — Progress Notes (Unsigned)
  Subjective:     Patient ID: Rebecca Terry, female   DOB: [birth year], 70 y.o.   MRN: [id number]  HPI   Patient here today for colposcopy with pap 10-09-20 ASCUS:Pos HR HPV.  Pap history: 10-09-20 ASCUS:Pos HR HPV 06-16-19 Neg:Neg HR HPV,01-05-18 Neg:Pos HR HPV, 08-09-16 Neg:Pos HR HPV. 01-05-18 Neg:Pos HR HPV, 01-19-18 colpo cx bx mild HPV, CIN I; vulvar Bx neg.08/09/16 Neg:Pos HR HPV; 12/15/17cervical biopsy confirmed CIN I, and the vulvar biopsy confirmed VIN I (condyloma).06/08/15 Pap smearExocervix bx - LGSIL, ECC negative; 05/08/15 Neg:Pos HR HPV;Pap smear 05-08-15 normal:Pos HR HPV--colposcopy revealing ECC neg and Exocervix LGSIL.History of pap 04-06-14 Ascus:Pos HR HPV with colposcopy revealing LGSIL of cervix and VIN I of perianal region   Review of Systems  All other systems reviewed and are negative.    LMP:PMP Contraception: PMP     Objective:   Physical Exam Genitourinary:       Colposcopy - cervix, vagina, and vulva.  Consent for procedure.  3% acetic acid used in vagina and on vulva. White light and green light filter used.  Colposcopy satisfactory:  Yes   _x___ with use of endocervical speculum          No    _____ Findings:   Significant atrophy noted of the cervix and vagina.  Cervix:  Thickened acetowhite patch at 8:00.  Biopsy taken. Vagina:  No acetowhite lesions.  Vulva:   Thin acetowhite epithelium of the perineum. Biopsies:   ECC, 8:00 on cervix, and perineum Perineal biopsy performed after sterile prep with betadine and infiltration of local 1% lidocaine, [id number], exp 7/25. 3 mm punch biopsy used.  3/0 single suture to the perineal biopsy site. Monsel's placed on cervix.  Minimal EBL. No complications.       Assessment:     ASCUS pap and positive HR HPV.  Hx LGSIL.  Atrophy noted today with colposcopy procedure.     Plan:     Fu biopsy results.  Reassurance given regarding her prior dx of LGSIL.  Will continue with vaginal  estradiol cream for use internally and externally.

## 2020-10-31 NOTE — Patient Instructions (Signed)
Colposcopy, Care After This sheet gives you information about how to care for yourself after your procedure. Your health care provider may also give you more specific instructions. If you have problems or questions, contact your health care provider. What can I expect after the procedure? If you had a colposcopy without a biopsy, you can expect to feel fine right away after your procedure. However, you may have some spotting of blood for a few days. You can return to your normal activities. If you had a colposcopy with a biopsy, it is common after the procedure to have:  Soreness and mild pain. These may last for a few days.  Light-headedness.  Mild vaginal bleeding or discharge that is dark-colored and grainy. This may last for a few days. The discharge may be caused by a liquid (solution) that was used during the procedure. You may need to wear a sanitary pad during this time.  Spotting of blood for at least 48 hours after the procedure. Follow these instructions at home: Medicines  Take over-the-counter and prescription medicines only as told by your health care provider.  Talk with your health care provider about what type of over-the-counter pain medicine and prescription medicine you can start to take again. It is especially important to talk with your health care provider if you take blood thinners. Activity  Limit your physical activity for the first day after your procedure as told by your health care provider.  Avoid using douche products, using tampons, or having sex for at least 3 days after the procedure or for as long as told.  Return to your normal activities as told by your health care provider. Ask your health care provider what activities are safe for you. General instructions  Drink enough fluid to keep your urine pale yellow.  Ask your health care provider if you may take baths, swim, or use a hot tub. You may take showers.  If you use birth control  (contraception), continue to use it.  Keep all follow-up visits as told by your health care provider. This is important.   Contact a health care provider if:  You develop a skin rash. Get help right away if:  You bleed a lot from your vagina or pass blood clots. This includes using more than one sanitary pad each hour for 2 hours in a row.  You have a fever or chills.  You have vaginal discharge that is abnormal, is yellow in color, or smells bad. This could be a sign of infection.  You have severe pain or cramps in your lower abdomen that do not go away with medicine.  You faint. Summary  If you had a colposcopy without a biopsy, you can expect to feel fine right away, but you may have some spotting of blood for a few days. You can return to your normal activities.  If you had a colposcopy with a biopsy, it is common to have mild pain for a few days and spotting for 48 hours after the procedure.  Avoid using douche products, using tampons, and having sex for at least 3 days after the procedure or for as long as told by your health care provider.  Get help right away if you have heavy bleeding, severe pain, or signs of infection. This information is not intended to replace advice given to you by your health care provider. Make sure you discuss any questions you have with your health care provider. Document Revised: 10/06/2019 Document Reviewed: 10/06/2019 Elsevier   Elsevier Patient Education  2021 [employer]   

## 2020-11-07 LAB — SURGICAL PATHOLOGY

## 2020-12-04 NOTE — Progress Notes (Signed)
Cardiology Office Note   Date:  12/05/2020   ID:  Rebecca Terry, DOB 11/29/1950, MRN [id number]  PCP:  Donald Prose, MD    No chief complaint on file.  HTN  Wt Readings from Last 3 Encounters:  12/05/20 168 lb 9.6 oz (76.5 kg)  10/31/20 164 lb (74.4 kg)  10/09/20 164 lb (74.4 kg)       History of Present Illness: Rebecca Terry is a 70 y.o. female   with HTN and aortic insufficiency.  Parents died at young age; father with MI (age 61).  Mother (asthma, age 12).  She works in the operating room.  She was having trouble losing weight and was considering intermittent fasting at the visit in 2020.  2015 echo: Left ventricle: The cavity size was normal. Wall thickness was normal. Systolic function was normal. The estimated ejection fraction was in the range of 55% to 60%. Wall motion was normal; there were no regional wall motion abnormalities. Doppler parameters are consistent with abnormal left ventricular relaxation (grade 1 diastolic dysfunction). The E/e&' ratio is between 8-15, suggesting indeterminate LV filling pressure. - Aortic valve: Structurally normal valve. Trileaflet. There was mild regurgitation. - Mitral valve: Mildly thickened leaflets . There was mild regurgitation. - Left atrium: The atrium was normal in size.  Impressions:  - Compared to the prior echo in 2012, there is persistent mild AI and mild MR. The LVEF has improved to 55-60%.   She has gone to part time at work.  She has been vaccinated and avoided COVID thus far.  Denies : Chest pain. Dizziness. Leg edema. Nitroglycerin use. Orthopnea. Palpitations. Paroxysmal nocturnal dyspnea. Shortness of breath. Syncope.   2021 echo showed: "Left ventricular ejection fraction, by estimation, is 50%. The left  ventricle has low normal function. The left ventricle has no regional wall  motion abnormalities. There is mild left ventricular hypertrophy. Left  ventricular  diastolic parameters are  consistent with Grade II diastolic dysfunction (pseudonormalization).  2. Right ventricular systolic function is normal. The right ventricular  size is normal. Tricuspid regurgitation signal is inadequate for assessing  PA pressure.  3. The mitral valve is normal in structure. Mild mitral valve  regurgitation. No evidence of mitral stenosis.  4. The aortic valve is tricuspid. Aortic valve regurgitation is mild. No  aortic stenosis is present.  5. The inferior vena cava is normal in size with greater than 50%  respiratory variability, suggesting right atrial pressure of 3 mmHg. "  Wants to increase exercise.  WOuld lke to see if her dietary changes have improved her cholesterol.  Noted to have some aortic atherosclerosis on abdominal CT, and atherosclerosis on shoulder xray.  Had coronary calcium score of 0 in 2020.    Past Medical History:  Diagnosis Date  . 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 Aortic valve disorders   . Arthritis   . Diverticulosis   . Elevated bilirubin 08/09/2016   level 1.4  . Essential hypertension, benign   . Fibroid    posterior  . Herpes genitalis in women   . History of MRSA infection 07/2015   culture positive [facility]  . Hypertension   . Osteopenia 2018   hip and spine  . Primary localized osteoarthritis of left knee   . Rectocele   . Urinary incontinence  Past Surgical History:  Procedure Laterality Date  . arthroscopic knee Left 2001  . BILATERAL OOPHORECTOMY  2010   Dr. Quincy Simmonds  . CARPAL TUNNEL RELEASE Bilateral   . COLONOSCOPY    . DIAGNOSTIC LAPAROSCOPY    . PELVIC LAPAROSCOPY     x2--ectopics  . TOTAL KNEE ARTHROPLASTY Left 12/11/2015   Procedure: LEFT TOTAL KNEE ARTHROPLASTY;  Surgeon: Elsie Saas, MD;  Location: [place];  Service: Orthopedics;  Laterality: Left;      Current Outpatient Medications  Medication Sig Dispense Refill  . clobetasol cream (TEMOVATE) 0.63 % Apply 1 application topically daily as needed.   1  . estradiol (ESTRACE) 0.1 MG/GM vaginal cream Place 1/2 gram in the vaginal two to three times weekly. 42.5 g 1  . hydrochlorothiazide (HYDRODIURIL) 25 MG tablet Take 25 mg by mouth daily.  1  . losartan (COZAAR) 100 MG tablet Take 100 mg by mouth daily.    . Magnesium 400 MG TABS Take 1 tablet by mouth daily.    . Multiple Vitamin (MULTIVITAMIN) tablet Take 1 tablet by mouth daily.    . pantoprazole (PROTONIX) 40 MG tablet Take 40 mg by mouth as needed.    . TURMERIC PO Take 1 tablet by mouth daily.    Marland Kitchen zolpidem (AMBIEN) 5 MG tablet Take 1 tablet by mouth as needed.     No current facility-administered medications for this visit.    Allergies:   Lisinopril, Povidone iodine, Adhesive [tape], Hibiclens [chlorhexidine gluconate], Latex, Nickel, and Penicillins    Social History:  The patient  reports that she quit smoking about 27 years ago. Her smoking use included cigarettes. She has a 10.00 pack-year smoking history. She has never used smokeless tobacco. She reports current alcohol use of about 5.0 standard drinks of alcohol per week. She reports that she does not use drugs.   Family History:  The patient's family history includes Asthma in her mother; Congestive Heart Failure in her father; Heart attack in her father; Multiple sclerosis in her father.    ROS:  Please see the history of present illness.   Otherwise, review of systems are positive for difficulty losing weight.   All other systems are reviewed and negative.    PHYSICAL EXAM: VS:  BP 130/70   Pulse 61   Ht 5\' 3"  (1.6 m)   Wt 168 lb 9.6 oz (76.5 kg)   LMP 10/21/1992   SpO2 98%   BMI 29.87 kg/m  , BMI Body mass index is 29.87 kg/m. GEN: Well nourished, well developed, in no acute distress  HEENT: normal  Neck: no JVD, carotid bruits, or masses Cardiac:  RRR; 1/6 diastolic murmur, no rubs, or gallops,no edema  Respiratory:  clear to auscultation bilaterally, normal work of breathing GI: soft, nontender, nondistended, + BS MS: no deformity or atrophy  Skin: warm and dry, no rash Neuro:  Strength and sensation are intact Psych: euthymic mood, full affect   EKG:   The ekg ordered today demonstrates NSR, nonspecific ST changes   Recent Labs: 10/09/2020: ALT 18; BUN 14; Creatinine, Ser 0.78; Hemoglobin 13.4; Platelets 302; Potassium 3.7; Sodium 139   Lipid Panel    Component Value Date/Time   CHOL 205 (H) 10/09/2020 1356   TRIG 138 10/09/2020 1356   HDL 66 10/09/2020 1356   CHOLHDL 3.4 06/16/2019 1352   CHOLHDL 3.0 08/09/2016 1500   VLDL 22 08/09/2016 1500   LDLCALC 115 (H) 10/09/2020 1356     Other studies Reviewed: Additional  studies/ records that were reviewed today with results demonstrating: LDL 115, HDL 66, labs reviewed.   ASSESSMENT AND PLAN:  1. Aortic insufficiency: No CHF sx.  Normal EF by most recent echo.  2. HTN: The current medical regimen is effective;  continue present plan and medications. Rare readings of 159 systolic. 3. Overweight: Trying to exercise more.  Avoiding processed foods.  Increased oatmeal.  Gave up wine due to diverticulitis.   4. Aortic atherosclerosis: COntinue preventive therapy.  Healthy diet and regular exercise.  No AAA.  Increasing fiber in her diet for diverticulitis.  WOuld have to consider statin if LDL increases on next check that we will do in a few months.    Current medicines are reviewed at length with the patient today.  The patient concerns regarding her medicines were addressed.  The following changes have been made:  No change  Labs/ tests ordered today include:  No orders of the defined types were placed in this encounter.   Recommend 150 minutes/week of aerobic exercise Low fat, low carb, high fiber diet recommended  Disposition:   FU in 1 year   Signed, Larae Grooms, MD  12/05/2020 8:42 AM    [facility] [address], [place], [place]  [address] Phone: [phone]; Fax: [phone]

## 2020-12-05 ENCOUNTER — Other Ambulatory Visit: Payer: Self-pay

## 2020-12-05 ENCOUNTER — Encounter: Payer: Self-pay | Admitting: Interventional Cardiology

## 2020-12-05 ENCOUNTER — Ambulatory Visit: Payer: PPO | Admitting: Interventional Cardiology

## 2020-12-05 VITALS — BP 130/70 | HR 61 | Ht 63.0 in | Wt 168.6 lb

## 2020-12-05 DIAGNOSIS — E669 Obesity, unspecified: Secondary | ICD-10-CM

## 2020-12-05 DIAGNOSIS — I700 Atherosclerosis of aorta: Secondary | ICD-10-CM | POA: Diagnosis not present

## 2020-12-05 DIAGNOSIS — I359 Nonrheumatic aortic valve disorder, unspecified: Secondary | ICD-10-CM | POA: Diagnosis not present

## 2020-12-05 DIAGNOSIS — I10 Essential (primary) hypertension: Secondary | ICD-10-CM | POA: Diagnosis not present

## 2020-12-05 NOTE — Patient Instructions (Signed)
Medication Instructions:  Your physician recommends that you continue on your current medications as directed. Please refer to the Current Medication list given to you today.  *If you need a refill on your cardiac medications before your next appointment, please call your pharmacy*   Lab Work: Your physician recommends that you return for lab work on June 20,2022.  This will be fasting--Lipid profile.  The lab opens at 7:30 AM  If you have labs (blood work) drawn today and your tests are completely normal, you will receive your results only by:  [place] (if you have MyChart) OR  A paper copy in the mail If you have any lab test that is abnormal or we need to change your treatment, we will call you to review the results.   Testing/Procedures: none   Follow-Up: At [facility], you and your health needs are our priority.  As part of our continuing mission to provide you with exceptional heart care, we have created designated Provider Care Teams.  These Care Teams include your primary Cardiologist (physician) and Advanced Practice Providers (APPs -  Physician Assistants and Nurse Practitioners) who all work together to provide you with the care you need, when you need it.  We recommend signing up for the patient portal called "MyChart".  Sign up information is provided on this After Visit Summary.  MyChart is used to connect with patients for Virtual Visits (Telemedicine).  Patients are able to view lab/test results, encounter notes, upcoming appointments, etc.  Non-urgent messages can be sent to your provider as well.   To learn more about what you can do with MyChart, go to [email or web address].    Your next appointment:   12 month(s)  The format for your next appointment:   In Person  Provider:   You may see Larae Grooms, MD or one of the following Advanced Practice Providers on your designated Care Team:    Melina Copa, PA-C  Ermalinda Barrios, PA-C    Other  Instructions

## 2020-12-20 DIAGNOSIS — K5792 Diverticulitis of intestine, part unspecified, without perforation or abscess without bleeding: Secondary | ICD-10-CM | POA: Diagnosis not present

## 2020-12-26 DIAGNOSIS — Z1389 Encounter for screening for other disorder: Secondary | ICD-10-CM | POA: Diagnosis not present

## 2020-12-26 DIAGNOSIS — L309 Dermatitis, unspecified: Secondary | ICD-10-CM | POA: Diagnosis not present

## 2020-12-26 DIAGNOSIS — F5101 Primary insomnia: Secondary | ICD-10-CM | POA: Diagnosis not present

## 2020-12-26 DIAGNOSIS — I10 Essential (primary) hypertension: Secondary | ICD-10-CM | POA: Diagnosis not present

## 2020-12-26 DIAGNOSIS — I700 Atherosclerosis of aorta: Secondary | ICD-10-CM | POA: Diagnosis not present

## 2020-12-26 DIAGNOSIS — Z23 Encounter for immunization: Secondary | ICD-10-CM | POA: Diagnosis not present

## 2021-01-01 ENCOUNTER — Other Ambulatory Visit (HOSPITAL_COMMUNITY): Payer: Self-pay | Admitting: Family Medicine

## 2021-01-01 DIAGNOSIS — I10 Essential (primary) hypertension: Secondary | ICD-10-CM | POA: Diagnosis not present

## 2021-01-25 ENCOUNTER — Ambulatory Visit: Payer: PPO | Admitting: Obstetrics and Gynecology

## 2021-01-30 ENCOUNTER — Other Ambulatory Visit (HOSPITAL_COMMUNITY): Payer: Self-pay

## 2021-01-30 MED ORDER — LOSARTAN POTASSIUM 100 MG PO TABS
ORAL_TABLET | ORAL | 1 refills | Status: DC
Start: 2021-01-30 — End: 2021-10-31
  Filled 2021-01-30: qty 90, 90d supply, fill #0

## 2021-02-15 ENCOUNTER — Other Ambulatory Visit (HOSPITAL_COMMUNITY): Payer: Self-pay

## 2021-03-06 ENCOUNTER — Other Ambulatory Visit (HOSPITAL_COMMUNITY): Payer: Self-pay

## 2021-03-06 MED ORDER — CIPROFLOXACIN HCL 500 MG PO TABS
ORAL_TABLET | ORAL | 0 refills | Status: DC
Start: 2021-03-06 — End: 2021-10-31
  Filled 2021-03-06: qty 20, 10d supply, fill #0

## 2021-03-06 MED ORDER — ZOLPIDEM TARTRATE 5 MG PO TABS
ORAL_TABLET | ORAL | 0 refills | Status: DC
Start: 2021-03-06 — End: 2021-10-31
  Filled 2021-03-06: qty 15, 15d supply, fill #0

## 2021-04-09 ENCOUNTER — Other Ambulatory Visit: Payer: PPO

## 2021-04-09 ENCOUNTER — Other Ambulatory Visit: Payer: Self-pay

## 2021-04-09 DIAGNOSIS — I10 Essential (primary) hypertension: Secondary | ICD-10-CM

## 2021-04-09 DIAGNOSIS — E669 Obesity, unspecified: Secondary | ICD-10-CM | POA: Diagnosis not present

## 2021-04-09 DIAGNOSIS — I359 Nonrheumatic aortic valve disorder, unspecified: Secondary | ICD-10-CM | POA: Diagnosis not present

## 2021-04-09 LAB — LIPID PANEL
Chol/HDL Ratio: 3.8 ratio (ref 0.0–4.4)
Cholesterol, Total: 202 mg/dL — ABNORMAL HIGH (ref 100–199)
HDL: 53 mg/dL (ref >39)
LDL Chol Calc (NIH): 109 mg/dL — ABNORMAL HIGH (ref 0–99)
Triglycerides: 233 mg/dL — ABNORMAL HIGH (ref 0–149)
VLDL Cholesterol Cal: 40 mg/dL (ref 5–40)

## 2021-04-17 ENCOUNTER — Other Ambulatory Visit (HOSPITAL_COMMUNITY): Payer: Self-pay

## 2021-04-17 MED ORDER — LOSARTAN POTASSIUM 100 MG PO TABS
ORAL_TABLET | Freq: Every day | ORAL | 1 refills | Status: DC
  Filled 2021-04-17: qty 90, 90d supply, fill #0

## 2021-04-17 MED ORDER — LOSARTAN POTASSIUM 100 MG PO TABS
ORAL_TABLET | ORAL | 0 refills | Status: DC
Start: 2021-04-17 — End: 2021-10-31
  Filled 2021-04-17: qty 90, 90d supply, fill #0

## 2021-04-17 MED FILL — Hydrochlorothiazide Tab 25 MG: ORAL | 90 days supply | Qty: 90 | Fill #0 | Status: AC

## 2021-04-19 ENCOUNTER — Other Ambulatory Visit (HOSPITAL_COMMUNITY): Payer: Self-pay

## 2021-04-19 ENCOUNTER — Telehealth: Payer: Self-pay | Admitting: *Deleted

## 2021-04-19 DIAGNOSIS — I700 Atherosclerosis of aorta: Secondary | ICD-10-CM

## 2021-04-19 DIAGNOSIS — Z79899 Other long term (current) drug therapy: Secondary | ICD-10-CM

## 2021-04-19 MED ORDER — ROSUVASTATIN CALCIUM 10 MG PO TABS
10.0000 mg | ORAL_TABLET | Freq: Every day | ORAL | 11 refills | Status: DC
  Filled 2021-04-19: qty 30, 30d supply, fill #0
  Filled 2021-05-22: qty 30, 30d supply, fill #1
  Filled 2021-06-19: qty 30, 30d supply, fill #2
  Filled 2021-07-19: qty 30, 30d supply, fill #3
  Filled 2021-10-01: qty 30, 30d supply, fill #4
  Filled 2021-11-22: qty 30, 30d supply, fill #5
  Filled 2022-01-31: qty 30, 30d supply, fill #6
  Filled 2022-04-08: qty 30, 30d supply, fill #7

## 2021-04-19 NOTE — Telephone Encounter (Signed)
-----   Message from Jettie Booze, MD sent at 04/14/2021 11:52 PM EDT ----- LDL still above 100.  TG elevated.  WOuld recommend rosuvastatin 10 mg daily and repeat liver and lipid check in 3 months given aortic atherosclerosis.

## 2021-04-19 NOTE — Telephone Encounter (Signed)
Patient notified.  Prescription sent to [facility] patient [facility].  She will come in for fasting lab work on 07/17/21

## 2021-05-03 DIAGNOSIS — L820 Inflamed seborrheic keratosis: Secondary | ICD-10-CM | POA: Diagnosis not present

## 2021-05-03 DIAGNOSIS — L814 Other melanin hyperpigmentation: Secondary | ICD-10-CM | POA: Diagnosis not present

## 2021-05-03 DIAGNOSIS — L301 Dyshidrosis [pompholyx]: Secondary | ICD-10-CM | POA: Diagnosis not present

## 2021-05-03 DIAGNOSIS — L570 Actinic keratosis: Secondary | ICD-10-CM | POA: Diagnosis not present

## 2021-05-08 DIAGNOSIS — R0681 Apnea, not elsewhere classified: Secondary | ICD-10-CM | POA: Diagnosis not present

## 2021-05-08 DIAGNOSIS — R0683 Snoring: Secondary | ICD-10-CM | POA: Diagnosis not present

## 2021-05-22 ENCOUNTER — Other Ambulatory Visit (HOSPITAL_COMMUNITY): Payer: Self-pay

## 2021-05-22 DIAGNOSIS — I10 Essential (primary) hypertension: Secondary | ICD-10-CM | POA: Diagnosis not present

## 2021-05-22 DIAGNOSIS — G4733 Obstructive sleep apnea (adult) (pediatric): Secondary | ICD-10-CM | POA: Diagnosis not present

## 2021-05-22 DIAGNOSIS — G4719 Other hypersomnia: Secondary | ICD-10-CM | POA: Diagnosis not present

## 2021-05-22 DIAGNOSIS — E785 Hyperlipidemia, unspecified: Secondary | ICD-10-CM | POA: Diagnosis not present

## 2021-05-31 DIAGNOSIS — E785 Hyperlipidemia, unspecified: Secondary | ICD-10-CM | POA: Diagnosis not present

## 2021-05-31 DIAGNOSIS — G4719 Other hypersomnia: Secondary | ICD-10-CM | POA: Diagnosis not present

## 2021-05-31 DIAGNOSIS — I10 Essential (primary) hypertension: Secondary | ICD-10-CM | POA: Diagnosis not present

## 2021-06-19 ENCOUNTER — Other Ambulatory Visit (HOSPITAL_COMMUNITY): Payer: Self-pay

## 2021-06-26 ENCOUNTER — Encounter (HOSPITAL_BASED_OUTPATIENT_CLINIC_OR_DEPARTMENT_OTHER): Payer: Self-pay

## 2021-06-26 DIAGNOSIS — G4709 Other insomnia: Secondary | ICD-10-CM

## 2021-06-26 DIAGNOSIS — R0683 Snoring: Secondary | ICD-10-CM

## 2021-06-26 DIAGNOSIS — G4710 Hypersomnia, unspecified: Secondary | ICD-10-CM

## 2021-06-26 DIAGNOSIS — G4733 Obstructive sleep apnea (adult) (pediatric): Secondary | ICD-10-CM

## 2021-07-03 ENCOUNTER — Other Ambulatory Visit (HOSPITAL_COMMUNITY): Payer: Self-pay

## 2021-07-03 DIAGNOSIS — Z23 Encounter for immunization: Secondary | ICD-10-CM | POA: Diagnosis not present

## 2021-07-03 DIAGNOSIS — I10 Essential (primary) hypertension: Secondary | ICD-10-CM | POA: Diagnosis not present

## 2021-07-03 DIAGNOSIS — F5101 Primary insomnia: Secondary | ICD-10-CM | POA: Diagnosis not present

## 2021-07-03 DIAGNOSIS — L309 Dermatitis, unspecified: Secondary | ICD-10-CM | POA: Diagnosis not present

## 2021-07-03 MED ORDER — HYDROCHLOROTHIAZIDE 25 MG PO TABS
ORAL_TABLET | ORAL | 1 refills | Status: DC
Start: 2021-07-03 — End: 2021-12-06
  Filled 2021-07-03: qty 90, 90d supply, fill #0
  Filled 2021-10-01: qty 90, 90d supply, fill #1

## 2021-07-03 MED ORDER — ZOLPIDEM TARTRATE 5 MG PO TABS
ORAL_TABLET | ORAL | 0 refills | Status: DC
Start: 2021-07-03 — End: 2021-10-31
  Filled 2021-07-03: qty 15, 15d supply, fill #0

## 2021-07-03 MED ORDER — LOSARTAN POTASSIUM 100 MG PO TABS
ORAL_TABLET | ORAL | 1 refills | Status: DC
Start: 2021-07-03 — End: 2022-06-13
  Filled 2021-07-03: qty 90, 90d supply, fill #0
  Filled 2021-10-01: qty 90, 90d supply, fill #1

## 2021-07-17 ENCOUNTER — Other Ambulatory Visit: Payer: Self-pay

## 2021-07-17 ENCOUNTER — Other Ambulatory Visit: Payer: PPO

## 2021-07-17 DIAGNOSIS — Z79899 Other long term (current) drug therapy: Secondary | ICD-10-CM | POA: Diagnosis not present

## 2021-07-17 DIAGNOSIS — I700 Atherosclerosis of aorta: Secondary | ICD-10-CM

## 2021-07-17 LAB — HEPATIC FUNCTION PANEL
ALT: 26 IU/L (ref 0–32)
AST: 23 IU/L (ref 0–40)
Albumin: 4.7 g/dL (ref 3.8–4.8)
Alkaline Phosphatase: 76 IU/L (ref 44–121)
Bilirubin Total: 1.1 mg/dL (ref 0.0–1.2)
Bilirubin, Direct: 0.26 mg/dL (ref 0.00–0.40)
Total Protein: 7.2 g/dL (ref 6.0–8.5)

## 2021-07-17 LAB — LIPID PANEL
Chol/HDL Ratio: 2.4 ratio (ref 0.0–4.4)
Cholesterol, Total: 141 mg/dL (ref 100–199)
HDL: 58 mg/dL (ref >39)
LDL Chol Calc (NIH): 61 mg/dL (ref 0–99)
Triglycerides: 128 mg/dL (ref 0–149)
VLDL Cholesterol Cal: 22 mg/dL (ref 5–40)

## 2021-07-19 ENCOUNTER — Other Ambulatory Visit (HOSPITAL_COMMUNITY): Payer: Self-pay

## 2021-07-19 ENCOUNTER — Other Ambulatory Visit: Payer: Self-pay | Admitting: Family Medicine

## 2021-07-19 DIAGNOSIS — Z1231 Encounter for screening mammogram for malignant neoplasm of breast: Secondary | ICD-10-CM

## 2021-07-19 MED FILL — Estradiol Vaginal Cream 0.1 MG/GM: VAGINAL | 90 days supply | Qty: 42.5 | Fill #0 | Status: AC

## 2021-08-06 DIAGNOSIS — H2513 Age-related nuclear cataract, bilateral: Secondary | ICD-10-CM | POA: Diagnosis not present

## 2021-08-06 DIAGNOSIS — H5203 Hypermetropia, bilateral: Secondary | ICD-10-CM | POA: Diagnosis not present

## 2021-08-15 ENCOUNTER — Other Ambulatory Visit: Payer: Self-pay

## 2021-08-15 ENCOUNTER — Ambulatory Visit (HOSPITAL_BASED_OUTPATIENT_CLINIC_OR_DEPARTMENT_OTHER): Payer: PPO | Attending: Sleep Medicine | Admitting: Sleep Medicine

## 2021-08-15 DIAGNOSIS — G4700 Insomnia, unspecified: Secondary | ICD-10-CM | POA: Insufficient documentation

## 2021-08-15 DIAGNOSIS — G4733 Obstructive sleep apnea (adult) (pediatric): Secondary | ICD-10-CM | POA: Diagnosis not present

## 2021-08-15 DIAGNOSIS — R0683 Snoring: Secondary | ICD-10-CM | POA: Insufficient documentation

## 2021-08-15 DIAGNOSIS — G4710 Hypersomnia, unspecified: Secondary | ICD-10-CM

## 2021-08-15 DIAGNOSIS — G4709 Other insomnia: Secondary | ICD-10-CM

## 2021-08-27 ENCOUNTER — Ambulatory Visit
Admission: RE | Admit: 2021-08-27 | Discharge: 2021-08-27 | Disposition: A | Discharge status: Home / Self Care | Payer: PPO | Source: Ambulatory Visit | Attending: Family Medicine | Admitting: Family Medicine

## 2021-08-27 ENCOUNTER — Other Ambulatory Visit: Payer: Self-pay

## 2021-08-27 DIAGNOSIS — Z1231 Encounter for screening mammogram for malignant neoplasm of breast: Secondary | ICD-10-CM | POA: Diagnosis not present

## 2021-08-30 DIAGNOSIS — G4733 Obstructive sleep apnea (adult) (pediatric): Secondary | ICD-10-CM | POA: Diagnosis not present

## 2021-09-04 ENCOUNTER — Telehealth: Payer: Self-pay | Admitting: Interventional Cardiology

## 2021-09-04 NOTE — Telephone Encounter (Signed)
Pt c/o medication issue:  1. Name of Medication: rosuvastatin (CRESTOR) 10 MG tablet  2. How are you currently taking this medication (dosage and times per day)? 1 tablet every other day  3. Are you having a reaction (difficulty breathing--STAT)? no  4. What is your medication issue? Patient states she has concerns about the side effects of the medication. She says she ahs been having increased headaches, general fatigue, and muscle cramps. She says she cut back to 1 tablet every other day and it did not help. She would like to know if she can cut back to twice a week or try another medication.

## 2021-09-04 NOTE — Telephone Encounter (Signed)
I spoke to patient.  She reports off and on cramps for several weeks.  Recent headaches and fatigue. No other issues or recent medication changes.  She has decreased Rosuvastatin to every other day but has not noticed any change in her symptoms. She would like to stop Rosuvastatin for a couple of months and then recheck labs.  Is asking if this would be OK with Dr Irish Lack.

## 2021-09-05 NOTE — Telephone Encounter (Signed)
Patient returning call. She says she can be called any time today.

## 2021-09-05 NOTE — Telephone Encounter (Signed)
OK to hold Crestor.

## 2021-09-05 NOTE — Telephone Encounter (Signed)
LMTCB

## 2021-09-05 NOTE — Telephone Encounter (Signed)
Pt advised that she can hold the Crestor for at least 2 weeks and will let us know how she is doing and if she plans to either go back on or ask for something else to try.

## 2021-09-06 NOTE — Procedures (Signed)
NAME: Rebecca Terry DATE OF BIRTH:  June 16, 1951 MEDICAL RECORD NUMBER [id number]  LOCATION: [facility]  PHYSICIAN:  D   DATE OF STUDY: 08/15/2021  SLEEP STUDY TYPE: Positive Airway Pressure Titration               REFERRING PHYSICIAN: Elmarie Mainland, MD  CLINICAL INFORMATION Rebecca Terry is a 70 year old Female and was referred to the sleep center for evaluation of obstructive sleep apnea.  MEDICATIONS Patient self administered medications include: Melatonin and Ambien. Sleep medications administered during study include - Ambien 5 mg at 09:45:00 PM,Melatonin 10 mg at 11:53:00 PM  SLEEP STUDY TECHNIQUE The patient underwent an attended overnight level one polysomnography titration to assess the effects of CPAP therapy. The following variables were monitored: EEG (C4-A1, C3-A2, O1-A2, O2-A1), EOG, submental and leg EMG, ECG, oxyhemoglobin saturation by pulse oximetry, thoracic and abdominal respiratory effort belts, nasal/oral airflow by pressure sensor, body position sensor and snoring sensor. CPAP pressure was titrated to eliminate apneas, hypopneas and oxygen desaturation. Hypopneas were scored per AASM definition IB (4% desaturation)  The NPSG portion of the study ended at 1:27:45 AM . The CPAP titration was initiated at 1:29:32 AM AM with the CPAP portion of the study ending at 4:45:08 AM.  TECHNICAL COMMENTS Comments added by Technician: Patient was ordered as a split night study. Comments added by Scorer: N/A SLEEP ARCHITECTURE The recording time for the entire night was 397.5 minutes. The diagnostic portion was initiated at 10:07:40 PM and terminated at 1:27:45 AM. The time in bed was 200.1 minutes. EEG confirmed total sleep time was 125.4 minutes yielding a sleep efficiency of 62.7%. Sleep onset after lights out was 18.2 minutes with a REM latency of N/A minutes. The patient spent 25.9% of the night in stage N1 sleep, 74.1% in stage N2  sleep, 0.0% in stage N3 and 0% in REM. The Arousal Index was 105.3/hour.  The titration portion was initiated at 1:29:32 AM and terminated at 4:45:08 AM. The time in bed was 195.6 minutes. EEG confirmed total sleep time was 176.0 minutes yielding a sleep efficiency of 90.0%. Sleep onset after CPAP initiation was 4.3 minutes with a REM latency of 18.0 minutes. The patient spent 15.9% of the night in stage N1 sleep, 60.8% in stage N2 sleep, 0.0% in stage N3 and 23.3% in REM. The Arousal Index was 28.0/hour. RESPIRATORY PARAMETERS During the diagnostic portion, there were a total of 206 respiratory disturbances recorded; 1 apneas ( 0 obstructive, 0 mixed, 1 central), 38 hypopneas and 167 RERAs. The apnea/hypopnea index 18.7 was events/hour and the RDI was 98.6 events/hour. The central sleep apnea index was 0.5 events/hour. The NREM AHI was 18.7/h. The REM RDI was 0.0 /h and NREM RDI was 98.6 /h. The supine AHI was 67.8/h, and the non supine AHI was 7.6/h; supine during 18.3% of sleep. The supine RDI was 122.6/h, and the non supine RDI was 95.52/h. Respiratory disturbances were associated with oxygen desaturation down to a nadir of 82.0% during sleep. The mean oxygen saturation during the study was 93.5%. The cumulative time under 88% oxygen saturation was 3.9 minutes.    During the titration portion, the apnea/hypopnea index (AHI) was 2.4 events/hour and the RDI was 6.8 events/hour. The central sleep apnea index was events/hour. The most appropriate setting of CPAP was 16 cm H2O. At this setting, the sleep efficiency was 77% and the patient was supine for 28%. The AHI was 0 events per hour (  with 0 central events). Oxygen nadir was 93%.  LEG MOVEMENT DATA The periodic limb movement index was 0.0/hour with an associated arousal index of /hour. CARDIAC DATA The underlying cardiac rhythm was most consistent with sinus rhythm. Mean heart rate was 68.4 during diagnostic portion and 63.8 during titration portion  of study. Additional rhythm abnormalities include PVCs. IMPRESSIONS - Moderate to Severe Obstructive Sleep apnea (OSA), optimal pressure attained. DIAGNOSIS - Obstructive Sleep Apnea (G47.33) RECOMMENDATIONS - Trial of CPAP therapy on 16 cm H2O with a Small size Resmed Full Face Mask AirFit F10 mask and heated humidification. - Avoid alcohol, sedatives and other CNS depressants that may worsen sleep apnea and disrupt normal sleep architecture. - Sleep hygiene should be reviewed to assess factors that may improve sleep quality. - Weight management and regular exercise should be initiated or continued.   D  Diplomate, American Board of Internal Medicine  ELECTRONICALLY SIGNED ON:  09/06/2021, 1:13 PM [place] PH: [phone]   FX: [phone] [place]

## 2021-10-01 ENCOUNTER — Other Ambulatory Visit (HOSPITAL_COMMUNITY): Payer: Self-pay

## 2021-10-05 ENCOUNTER — Other Ambulatory Visit (HOSPITAL_COMMUNITY): Payer: Self-pay

## 2021-10-05 DIAGNOSIS — Z23 Encounter for immunization: Secondary | ICD-10-CM | POA: Diagnosis not present

## 2021-10-05 DIAGNOSIS — K5792 Diverticulitis of intestine, part unspecified, without perforation or abscess without bleeding: Secondary | ICD-10-CM | POA: Diagnosis not present

## 2021-10-05 MED ORDER — AMOXICILLIN-POT CLAVULANATE 875-125 MG PO TABS
ORAL_TABLET | ORAL | 0 refills | Status: DC
  Filled 2021-10-05: qty 20, 10d supply, fill #0

## 2021-10-25 DIAGNOSIS — G4733 Obstructive sleep apnea (adult) (pediatric): Secondary | ICD-10-CM | POA: Diagnosis not present

## 2021-10-30 NOTE — Progress Notes (Signed)
71 y.o. Rebecca Terry Single Caucasian female here for annual exam.    Patient is followed for multiple issues including abnormal paps, osteopenia, rectocele, and vaginal atrophy. Some splinting to have BMs.  No fecal incontinence.  No urinary incontinence unless jumping or laughing really hard.   Using Estrace twice a week.    Started CPAP recently.   PCP:  Donald Prose, MD  Patient's last menstrual period was 10/21/1992.           Sexually active: No.  The current method of family planning is post menopausal status.    Exercising: No.   walking Smoker:  no  Health Maintenance: Pap:  08-22-20 3D/Neg/density C/BiRads1 Last pap smear:  10-09-20 ASCUS:Pos HR HPV, 06-16-19 Neg:Neg HR HPV, 01-05-18 Neg:Pos HR HPV History of abnormal Pap:  Yes, 10-31-20 colpo revealed ECC LSIL, perineal bx verruca vulgaris. 10-09-20 ASCUS:Pos HR HPV, 01-05-18 Neg:Pos HR HPV, 01-19-18 colpo cx bx mild HPV, CIN I; vulvar Bx neg.  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 06/08/15 Pap smear Exocervix bx - LGSIL, ECC negative; 05/08/15 Neg:Pos HR HPV; Pap smear 05-08-15 normal:Pos HR HPV--colposcopy revealing LGSIL of cervix and VIN I of perianal region MMG:  08-27-21 Neg/BiRads1 Colonoscopy:  2022 normal;next 5 years BMD: 08-19-19  Result :Osteopenia TDaP:  03/2013 Gardasil:   no HIV:08-09-16 NR Hep C: 08-09-16 Neg Screening Labs: PCP   reports that she quit smoking about 28 years ago. Her smoking use included cigarettes. She has a 10.00 pack-year smoking history. She has never used smokeless tobacco. She reports current alcohol use of about 5.0 standard drinks per week. She reports that she does not use drugs.  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 Quincy Simmonds   CARPAL TUNNEL RELEASE Bilateral    COLONOSCOPY     DIAGNOSTIC LAPAROSCOPY     PELVIC LAPAROSCOPY     x2--ectopics   TOTAL KNEE ARTHROPLASTY Left 12/11/2015   Procedure: LEFT TOTAL KNEE ARTHROPLASTY;  Surgeon: Elsie Saas, MD;  Location: [facility];  Service: Orthopedics;  Laterality: Left;    Current Outpatient Medications  Medication Sig Dispense Refill   clobetasol cream (TEMOVATE) 9.56 % Apply 1 application topically daily as needed.   1   estradiol (ESTRACE) 0.1 MG/GM vaginal cream PLACE 1/2 GRAM VAGINALLY TWO TO THREE TIMES WEEKLY. 42.5 g 1   hydrochlorothiazide (HYDRODIURIL) 25 MG tablet Take 1 tablet by mouth once a day 90 tablet 1   losartan (COZAAR) 100 MG tablet Take 1 tablet by mouth once a day 90 tablet 1   Magnesium 400 MG TABS Take 1  tablet by mouth daily.     Multiple Vitamin (MULTIVITAMIN) tablet Take 1 tablet by mouth daily.     rosuvastatin (CRESTOR) 10 MG tablet Take 1 tablet (10 mg total) by mouth daily. 30 tablet 11   TURMERIC PO Take 1 tablet by mouth daily.     zolpidem (AMBIEN) 5 MG tablet 1-2 tablets at bedtime as needed for insomnia     No current facility-administered medications for this visit.    Family History  Problem Relation Age of Onset   Asthma Mother    Congestive Heart Failure Father    Multiple sclerosis Father    Heart attack Father     Review of Systems  All other systems reviewed and are negative.  Exam:   BP 138/82    Pulse 75     Ht 5\' 3"  (1.6 m)    Wt 168 lb (76.2 kg)    LMP 10/21/1992    SpO2 97%    BMI 29.76 kg/m     General appearance: alert, cooperative and appears stated age Head: normocephalic, without obvious abnormality, atraumatic Neck: no adenopathy, supple, symmetrical, trachea midline and thyroid normal to inspection and palpation Lungs: clear to auscultation bilaterally Breasts: normal appearance, no masses or tenderness, No nipple retraction or dimpling, No nipple discharge or bleeding, No axillary adenopathy Heart: regular rate and rhythm Abdomen: soft, non-tender; no masses, no organomegaly Extremities: extremities normal, atraumatic, no cyanosis or edema Skin: skin color, texture, turgor normal. No rashes or lesions Lymph nodes: cervical, supraclavicular, and axillary nodes normal. Neurologic: grossly normal  Pelvic: External genitalia:  mild white flat epithelial change of the perineum.               No abnormal inguinal nodes palpated.              Urethra:  normal appearing urethra with no masses, tenderness or lesions              Bartholins and Skenes: normal                 Vagina: normal appearing vagina with normal color and discharge, no lesions.  First degree rectocele.               Cervix: no lesions              Pap taken: yes Bimanual Exam:  Uterus:  normal size, contour, position, consistency, mobility, non-tender              Adnexa: no mass, fullness, tenderness              Rectal exam: yes.  Confirms.              Anus:  normal sphincter tone, no lesions  Chaperone was present for exam:  Estill Bamberg, CMA  Assessment:   Well woman visit with gynecologic exam. High risk Medicare GYN exam. Hx LGSIL and VIN I. Hx HSV. Minimal stress incontinence. Rectocele.  Vaginal atrophy. Doing well with vaginal estrogen cream. Osteopenia. Aortic insufficiency.  HTN.   Plan: Mammogram screening discussed. Self breast awareness reviewed. Pap and HR HPV collected. Guidelines for  Calcium, Vitamin D, regular exercise program including cardiovascular and weight bearing exercise. Refill of vaginal estrogen cream.   BMD ordered. Follow up annually and prn.   After visit summary provided.   20 min  total time was spent for this patient encounter, including preparation, face-to-face counseling with the patient, coordination of care, and documentation of the encounter.

## 2021-10-31 ENCOUNTER — Encounter: Payer: Self-pay | Admitting: Obstetrics and Gynecology

## 2021-10-31 ENCOUNTER — Other Ambulatory Visit: Payer: Self-pay

## 2021-10-31 ENCOUNTER — Other Ambulatory Visit (HOSPITAL_COMMUNITY): Payer: Self-pay

## 2021-10-31 ENCOUNTER — Other Ambulatory Visit (HOSPITAL_COMMUNITY)
Admission: RE | Admit: 2021-10-31 | Discharge: 2021-10-31 | Disposition: A | Discharge status: Home / Self Care | Payer: PPO | Source: Ambulatory Visit | Attending: Obstetrics and Gynecology | Admitting: Obstetrics and Gynecology

## 2021-10-31 ENCOUNTER — Ambulatory Visit: Payer: PPO | Admitting: Obstetrics and Gynecology

## 2021-10-31 VITALS — BP 138/82 | HR 75 | Ht 63.0 in | Wt 168.0 lb

## 2021-10-31 DIAGNOSIS — Z124 Encounter for screening for malignant neoplasm of cervix: Secondary | ICD-10-CM | POA: Insufficient documentation

## 2021-10-31 DIAGNOSIS — M8589 Other specified disorders of bone density and structure, multiple sites: Secondary | ICD-10-CM

## 2021-10-31 DIAGNOSIS — Z87410 Personal history of cervical dysplasia: Secondary | ICD-10-CM | POA: Insufficient documentation

## 2021-10-31 DIAGNOSIS — B009 Herpesviral infection, unspecified: Secondary | ICD-10-CM

## 2021-10-31 DIAGNOSIS — Z01419 Encounter for gynecological examination (general) (routine) without abnormal findings: Secondary | ICD-10-CM | POA: Diagnosis not present

## 2021-10-31 DIAGNOSIS — Z9189 Other specified personal risk factors, not elsewhere classified: Secondary | ICD-10-CM

## 2021-10-31 DIAGNOSIS — Z1151 Encounter for screening for human papillomavirus (HPV): Secondary | ICD-10-CM | POA: Diagnosis not present

## 2021-10-31 DIAGNOSIS — R87810 Cervical high risk human papillomavirus (HPV) DNA test positive: Secondary | ICD-10-CM | POA: Insufficient documentation

## 2021-10-31 DIAGNOSIS — N952 Postmenopausal atrophic vaginitis: Secondary | ICD-10-CM | POA: Diagnosis not present

## 2021-10-31 DIAGNOSIS — Z780 Asymptomatic menopausal state: Secondary | ICD-10-CM

## 2021-10-31 MED ORDER — ESTRADIOL 0.1 MG/GM VA CREA
TOPICAL_CREAM | VAGINAL | 1 refills | Status: DC
  Filled 2021-10-31: qty 42.5, 90d supply, fill #0
  Filled 2022-04-08: qty 42.5, 90d supply, fill #1

## 2021-10-31 NOTE — Patient Instructions (Signed)

## 2021-11-01 ENCOUNTER — Other Ambulatory Visit (HOSPITAL_COMMUNITY): Payer: Self-pay

## 2021-11-05 ENCOUNTER — Other Ambulatory Visit: Payer: Self-pay

## 2021-11-05 DIAGNOSIS — R87610 Atypical squamous cells of undetermined significance on cytologic smear of cervix (ASC-US): Secondary | ICD-10-CM

## 2021-11-05 DIAGNOSIS — R87810 Cervical high risk human papillomavirus (HPV) DNA test positive: Secondary | ICD-10-CM

## 2021-11-05 LAB — CYTOLOGY - PAP
Comment: NEGATIVE
Diagnosis: UNDETERMINED — AB
High risk HPV: POSITIVE — AB

## 2021-11-06 DIAGNOSIS — G4733 Obstructive sleep apnea (adult) (pediatric): Secondary | ICD-10-CM | POA: Diagnosis not present

## 2021-11-08 DIAGNOSIS — D225 Melanocytic nevi of trunk: Secondary | ICD-10-CM | POA: Diagnosis not present

## 2021-11-08 DIAGNOSIS — L821 Other seborrheic keratosis: Secondary | ICD-10-CM | POA: Diagnosis not present

## 2021-11-08 DIAGNOSIS — L570 Actinic keratosis: Secondary | ICD-10-CM | POA: Diagnosis not present

## 2021-11-08 DIAGNOSIS — L578 Other skin changes due to chronic exposure to nonionizing radiation: Secondary | ICD-10-CM | POA: Diagnosis not present

## 2021-11-08 DIAGNOSIS — Z23 Encounter for immunization: Secondary | ICD-10-CM | POA: Diagnosis not present

## 2021-11-08 DIAGNOSIS — L720 Epidermal cyst: Secondary | ICD-10-CM | POA: Diagnosis not present

## 2021-11-08 DIAGNOSIS — L814 Other melanin hyperpigmentation: Secondary | ICD-10-CM | POA: Diagnosis not present

## 2021-11-08 DIAGNOSIS — L301 Dyshidrosis [pompholyx]: Secondary | ICD-10-CM | POA: Diagnosis not present

## 2021-11-23 ENCOUNTER — Other Ambulatory Visit (HOSPITAL_COMMUNITY): Payer: Self-pay

## 2021-11-25 DIAGNOSIS — G4733 Obstructive sleep apnea (adult) (pediatric): Secondary | ICD-10-CM | POA: Diagnosis not present

## 2021-12-06 ENCOUNTER — Ambulatory Visit: Payer: PPO | Admitting: Obstetrics and Gynecology

## 2021-12-06 ENCOUNTER — Other Ambulatory Visit (HOSPITAL_COMMUNITY)
Admission: RE | Admit: 2021-12-06 | Discharge: 2021-12-06 | Disposition: A | Discharge status: Home / Self Care | Payer: PPO | Source: Ambulatory Visit | Attending: Obstetrics and Gynecology | Admitting: Obstetrics and Gynecology

## 2021-12-06 ENCOUNTER — Encounter: Payer: Self-pay | Admitting: Obstetrics and Gynecology

## 2021-12-06 ENCOUNTER — Other Ambulatory Visit: Payer: Self-pay

## 2021-12-06 VITALS — BP 124/76 | HR 84 | Ht 63.0 in | Wt 168.0 lb

## 2021-12-06 DIAGNOSIS — R87610 Atypical squamous cells of undetermined significance on cytologic smear of cervix (ASC-US): Secondary | ICD-10-CM

## 2021-12-06 DIAGNOSIS — N952 Postmenopausal atrophic vaginitis: Secondary | ICD-10-CM

## 2021-12-06 DIAGNOSIS — N72 Inflammatory disease of cervix uteri: Secondary | ICD-10-CM | POA: Diagnosis not present

## 2021-12-06 DIAGNOSIS — R87810 Cervical high risk human papillomavirus (HPV) DNA test positive: Secondary | ICD-10-CM

## 2021-12-06 DIAGNOSIS — N870 Mild cervical dysplasia: Secondary | ICD-10-CM | POA: Diagnosis not present

## 2021-12-06 NOTE — Patient Instructions (Signed)
Colposcopy, Care After ?The following information offers guidance on how to care for yourself after your procedure. Your health care provider may also give you more specific instructions. If you have problems or questions, contact your health care provider. ?What can I expect after the procedure? ?If you had a colposcopy without a biopsy, you can expect to feel fine right away after your procedure. However, you may have some spotting of blood for a few days. You can return to your normal activities. ?If you had a colposcopy with a biopsy, it is common after the procedure to have: ?Soreness and mild pain. These may last for a few days. ?Mild vaginal bleeding or discharge that is dark-colored and grainy. This may last for a few days. The discharge may be caused by a liquid (solution) that was used during the procedure. You may need to wear a sanitary pad during this time. ?Spotting of blood for at least 48 hours after the procedure. ?Follow these instructions at home: ?Medicines ?Take over-the-counter and prescription medicines only as told by your health care provider. ?Talk with your health care provider about what type of over-the-counter pain medicines and prescription medicines you can start to take again. It is especially important to talk with your health care provider if you take blood thinners. ?Activity ?Avoid using douche products, using tampons, and having sex for at least 3 days after the procedure or for as long as told by your health care provider. ?Return to your normal activities as told by your health care provider. Ask your health care provider what activities are safe for you. ?General instructions ?Ask your health care provider if you may take baths, swim, or use a hot tub. You may take showers. ?If you use birth control (contraception), continue to use it. ?Keep all follow-up visits. This is important. ?Contact a health care provider if: ?You have a fever or chills. ?You faint or feel  light-headed. ?Get help right away if: ?You have heavy bleeding from your vagina or pass blood clots. Heavy bleeding is bleeding that soaks through a sanitary pad in less than 1 hour. ?You have vaginal discharge that is abnormal, is yellow in color, or smells bad. This could be a sign of infection. ?You have severe pain or cramps in your lower abdomen that do not go away with medicine. ?Summary ?If you had a colposcopy without a biopsy, you can expect to feel fine right away, but you may have some spotting of blood for a few days. You can return to your normal activities. ?If you had a colposcopy with a biopsy, it is common to have mild pain for a few days and spotting for 48 hours after the procedure. ?Avoid using douche products, using tampons, and having sex for at least 3 days after the procedure or for as long as told by your health care provider. ?Get help right away if you have heavy bleeding, severe pain, or signs of infection. ?This information is not intended to replace advice given to you by your health care provider. Make sure you discuss any questions you have with your health care provider. ?Document Revised: 03/04/2021 Document Reviewed: 03/04/2021 ?Elsevier Patient Education ? 2022 [employer]vier Inc. ? ?

## 2021-12-06 NOTE — Progress Notes (Signed)
°  Subjective:     Patient ID: Rebecca Terry, female   DOB: 1950/11/16, 71 y.o.   MRN: [id number]  HPI Patient here today for colposcopy with pap 10-31-21 ASCUS:Pos HR HPV. Using estrogen cream externally but not inside the vaginal canal.  PAP HISTORY: 10-31-21 ASCUS:Pos HR HPV 10-31-20 colposcopy exocx bx LGSIL, perineal bx showed verruca vulgaris. 10-09-20 ASCUS:Pos HR HPV, 06-16-19 Neg:Neg HR HPV, 01-05-18 Neg:Pos HR HPV History of abnormal Pap:  Yes, 10-31-20 colpo revealed ECC LSIL, perineal bx verruca vulgaris. 10-09-20 ASCUS:Pos HR HPV, 01-05-18 Neg:Pos HR HPV, 01-19-18 colpo cx bx mild HPV, CIN I; vulvar Bx neg.  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 06/08/15 Pap smear Exocervix bx - LGSIL, ECC negative; 05/08/15 Neg:Pos HR --colposcopy revealing LGSIL of cervix and VIN I of perianal region   Review of Systems  All other systems reviewed and are negative. LMP: PMP     Objective:   Physical Exam Genitourinary:       Colposcopy  Consent for procedure.  3% acetic acid used in vagina and on vulva.  Atrophy of the cervix noted.  Acetowhite change at mildline posterior vaginal cuff.   ECC and biopsy of the posterior midline cuff, each sent to pathology separately.  Monsel's placed on the vaginal cuff bx site.  Scatter minor 1 - 2 mm areas of flat acetowhite change of the bilateral labia minora and coalescing area of flat acetowhite change of the perineum.  No raised lesions. No vulvar biopsies taken.   No complications.  Minimal EBL. Assessment:     ASCUS pap.  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 Atrophy noted.     Plan:     Fu biopsies.  I encouraged use of the vaginal estrogen cream internally as well as externally.  We discussed that surgical procedures such as LEEP or cold knife conization can remove the abnormal cells but can contribute to scarring and difficulty with further evaluation  of the cervix.  They also do not remove HPV.

## 2021-12-10 LAB — SURGICAL PATHOLOGY

## 2021-12-18 ENCOUNTER — Ambulatory Visit
Admission: RE | Admit: 2021-12-18 | Discharge: 2021-12-18 | Disposition: A | Discharge status: Home / Self Care | Payer: PPO | Source: Ambulatory Visit | Attending: Obstetrics and Gynecology | Admitting: Obstetrics and Gynecology

## 2021-12-18 DIAGNOSIS — M8589 Other specified disorders of bone density and structure, multiple sites: Secondary | ICD-10-CM

## 2021-12-18 DIAGNOSIS — Z780 Asymptomatic menopausal state: Secondary | ICD-10-CM | POA: Diagnosis not present

## 2021-12-18 DIAGNOSIS — M85851 Other specified disorders of bone density and structure, right thigh: Secondary | ICD-10-CM | POA: Diagnosis not present

## 2021-12-23 DIAGNOSIS — G4733 Obstructive sleep apnea (adult) (pediatric): Secondary | ICD-10-CM | POA: Diagnosis not present

## 2021-12-31 DIAGNOSIS — M67961 Unspecified disorder of synovium and tendon, right lower leg: Secondary | ICD-10-CM | POA: Diagnosis not present

## 2021-12-31 DIAGNOSIS — M76821 Posterior tibial tendinitis, right leg: Secondary | ICD-10-CM | POA: Insufficient documentation

## 2021-12-31 DIAGNOSIS — M79671 Pain in right foot: Secondary | ICD-10-CM | POA: Diagnosis not present

## 2021-12-31 DIAGNOSIS — M25571 Pain in right ankle and joints of right foot: Secondary | ICD-10-CM | POA: Diagnosis not present

## 2022-01-05 NOTE — Progress Notes (Signed)
?Cardiology Office Note:   ? ?Date:  01/07/2022  ? ?ID:  [name], DOB [birth year], MRN [id number] ? ?PCP:  Donald Prose, MD ?  ?[facility] Providers ?Cardiologist:  Larae Grooms, MD    ? ?Referring MD: Donald Prose, MD  ? ?Chief Complaint: annual follow-up aortic valve insufficiency, hyperlipidemia ? ?History of Present Illness:   ? ?Rebecca Terry is a 71 y.o. female with a hx of aortic valve insufficiency, HTN, aortic atherosclerosis, diverticulosis, hyperlipidemia, and arthritis. Strong family history of CAD. ? ?She established care prior to 2012 for aortic insufficiency. She has maintained consistent follow up. She had a normal Cardiolite stress test in 2007 following an abnormal exercise stress test. Calcium score of zero 11/2018. Echo 12/2019 revealed LVEF 50%, no rwma, mild LVH, G2DD, normal RV, mild MR, mild AI, no dilatation of aorta. Aortic valve is tri-leaflet.  ? ?She was last seen in the office by Dr. Irish Lack on 12/05/20 at which time no changes were made to her medical therapy and one year follow-up was recommended. She called in November and was advised she could hold Crestor to see if it helped with cramping.  ? ?Today, she is here alone for annual follow-up. Reports she is feeling well overall. Had a sleep study and is now on CPAP, feels better since starting this therapy. Reports that her muscle pain has improved since initially holding rosuvastatin and then restarting at 2 days a week. Has questions about variations in blood pressure.  ? ?Muscle pain was improved off  ?3x now 2x ?Now on CPAP - feels better overall ?She will resume asa ?Checking lipids/cmet - continue rosuvastatin 2 x per week now ?1 yr f/u ?BP monitoring ? ?Past Medical History:  ?Diagnosis Date  ? Abnormal Pap smear of cervix   ? 09/2013 Ascus:Pos HR HPV, 03/2014 Ascus:Pos HR HPV;colpo 05/2014 LGSIL of cx and VIN 1of perianal region, 08-09-16 neg HPV HR +  ? Aortic valve disorders   ? Arthritis   ? Diverticulosis    ? Elevated bilirubin 08/09/2016  ? level 1.4  ? Essential hypertension, benign   ? Fibroid   ? posterior  ? Herpes genitalis in women   ? History of MRSA infection 07/2015  ? culture positive [facility]  ? Hyperlipidemia   ? Hypertension   ? Osteopenia 2018  ? hip and spine  ? Primary localized osteoarthritis of left knee   ? Rectocele   ? Urinary incontinence   ? ? ?Past Surgical History:  ?Procedure Laterality Date  ? arthroscopic knee Left 2001  ? BILATERAL OOPHORECTOMY  2010  ? Dr. Quincy Simmonds  ? CARPAL TUNNEL RELEASE Bilateral   ? COLONOSCOPY    ? DIAGNOSTIC LAPAROSCOPY    ? PELVIC LAPAROSCOPY    ? x2--ectopics  ? TOTAL KNEE ARTHROPLASTY Left 12/11/2015  ? Procedure: LEFT TOTAL KNEE ARTHROPLASTY;  Surgeon: Elsie Saas, MD;  Location: [facility];  Service: Orthopedics;  Laterality: Left;  ? ? ?Current Medications: ?Current Meds  ?Medication Sig  ? clobetasol cream (TEMOVATE) 7.10 % 1 application to affected area  ? estradiol (ESTRACE) 0.1 MG/GM vaginal cream PLACE 1/2 GRAM VAGINALLY 2 - 3 TIMES WEEKLY AS DIRECTED  ? hydrochlorothiazide (HYDRODIURIL) 25 MG tablet 1 tablet in the morning  ? losartan (COZAAR) 100 MG tablet Take 1 tablet by mouth once a day  ? Magnesium 400 MG TABS Take 1 tablet by mouth daily.  ? Multiple Vitamin (MULTIVITAMIN) tablet Take 1 tablet by mouth daily.  ?  rosuvastatin (CRESTOR) 10 MG tablet Take 1 tablet (10 mg total) by mouth daily. (Patient taking differently: Take 10 mg by mouth daily. Taking 2 a week)  ? TURMERIC PO Take 1 tablet by mouth daily.  ? zolpidem (AMBIEN) 5 MG tablet 1-2 tablets at bedtime as needed for insomnia  ?  ? ?Allergies:   Lisinopril, Povidone iodine, Hibiclens [chlorhexidine gluconate], Latex, Nickel, and Penicillins  ? ?Social History  ? ?Socioeconomic History  ? Marital status: Single  ?  Spouse name: Not on file  ? Number of children: Not on file  ? Years of education: Not on file  ? Highest education level: Not on file  ?Occupational History  ? Not on file   ?Tobacco Use  ? Smoking status: Former  ?  Packs/day: 0.50  ?  Years: 20.00  ?  Pack years: 10.00  ?  Types: Cigarettes  ?  Quit date: 04/07/1993  ?  Years since quitting: 28.7  ? Smokeless tobacco: Never  ?Vaping Use  ? Vaping Use: Never used  ?Substance and Sexual Activity  ? Alcohol use: Yes  ?  Alcohol/week: 5.0 standard drinks  ?  Types: 5 Standard drinks or equivalent per week  ?  Comment: 5 glasses wine/beer per week  ? Drug use: No  ? Sexual activity: Not Currently  ?  Partners: Male  ?  Birth control/protection: Post-menopausal  ?Other Topics Concern  ? Not on file  ?Social History Narrative  ? Not on file  ? ?Social Determinants of Health  ? ?Financial Resource Strain: Not on file  ?Food Insecurity: Not on file  ?Transportation Needs: Not on file  ?Physical Activity: Not on file  ?Stress: Not on file  ?Social Connections: Not on file  ?  ? ?Family History: ?The patient's family history includes Asthma in her mother; Congestive Heart Failure in her father; Heart attack in her father; Multiple sclerosis in her father. ? ?ROS:   ?Please see the history of present illness.  All other systems reviewed and are negative. ? ?Labs/Other Studies Reviewed:   ? ?The following studies were reviewed today: ? ?CT Calcium Score 11/2018 ? ?Non-cardiac: See separate report from [facility]. ?  ?Ascending aorta: Normal diameter 3.3 cm ?  ?Pericardium: Normal ?  ?Coronary arteries: No calcium noted ?  ?IMPRESSION: ?Coronary calcium score of 0. ? ?Echo 12/2019 ? ?Left Ventricle: Left ventricular ejection fraction, by estimation, is  ?50%. The left ventricle has low normal function. The left ventricle has no  ?regional wall motion abnormalities. The left ventricular internal cavity  ?size was normal in size. There is  ?mild left ventricular hypertrophy. Left ventricular diastolic parameters  ?are consistent with Grade II diastolic dysfunction (pseudonormalization).  ?Indeterminate filling pressures.  ?Right Ventricle:  The right ventricular size is normal. No increase in  ?right ventricular wall thickness. Right ventricular systolic function is  ?normal. Tricuspid regurgitation signal is inadequate for assessing PA  ?pressure.  ?Left Atrium: Left atrial size was normal in size.  ?Right Atrium: Right atrial size was normal in size.  ?Pericardium: There is no evidence of pericardial effusion.  ?Mitral Valve: The mitral valve is normal in structure. Normal mobility of  ?the mitral valve leaflets. Mild mitral valve regurgitation. No evidence of  ?mitral valve stenosis.  ?Tricuspid Valve: The tricuspid valve is normal in structure. Tricuspid  ?valve regurgitation is trivial. No evidence of tricuspid stenosis.  ?Aortic Valve: Vena contracta 0.2 cm, LVESD 43m. The aortic valve is  ?tricuspid. Aortic valve  regurgitation is mild. Aortic regurgitation PHT  ?measures 775 msec. No aortic stenosis is present.  ?Pulmonic Valve: The pulmonic valve was normal in structure. Pulmonic valve  ?regurgitation is mild. No evidence of pulmonic stenosis.  ?Aorta: The aortic root and ascending aorta are structurally normal, with  ?no evidence of dilitation.  ?Venous: The inferior vena cava is normal in size with greater than 50%  ?respiratory variability, suggesting right atrial pressure of 3 mmHg.  ?IAS/Shunts: No atrial level shunt detected by color flow Doppler.  ? ?Recent Labs: ?07/17/2021: ALT 26  ?Recent Lipid Panel ?   ?Component Value Date/Time  ? CHOL 141 07/17/2021 0808  ? TRIG 128 07/17/2021 0808  ? HDL 58 07/17/2021 0808  ? CHOLHDL 2.4 07/17/2021 0808  ? CHOLHDL 3.0 08/09/2016 1500  ? VLDL 22 08/09/2016 1500  ? [place] 61 07/17/2021 0808  ? ? ? ?Risk Assessment/Calculations:   ?  ? ? ?Physical Exam:   ? ?VS:  BP 130/78 (BP Location: Right Arm, Patient Position: Sitting, Cuff Size: Normal)   Pulse 66   Ht '5\' 4"'$  (1.626 m)   Wt 170 lb (77.1 kg)   LMP 10/21/1992   SpO2 98%   BMI 29.18 kg/m?    ? ?Wt Readings from Last 3 Encounters:  ?01/07/22  170 lb (77.1 kg)  ?12/06/21 168 lb (76.2 kg)  ?10/31/21 168 lb (76.2 kg)  ?  ? ?GEN:  Well nourished, well developed in no acute distress ?HEENT: Normal ?NECK: No JVD; No carotid bruits ?CARDIAC: RRR, soft systoli

## 2022-01-07 ENCOUNTER — Other Ambulatory Visit: Payer: Self-pay

## 2022-01-07 ENCOUNTER — Ambulatory Visit: Payer: PPO | Admitting: Nurse Practitioner

## 2022-01-07 ENCOUNTER — Encounter: Payer: Self-pay | Admitting: Nurse Practitioner

## 2022-01-07 VITALS — BP 130/78 | HR 66 | Ht 64.0 in | Wt 170.0 lb

## 2022-01-07 DIAGNOSIS — I359 Nonrheumatic aortic valve disorder, unspecified: Secondary | ICD-10-CM | POA: Diagnosis not present

## 2022-01-07 DIAGNOSIS — I5032 Chronic diastolic (congestive) heart failure: Secondary | ICD-10-CM

## 2022-01-07 DIAGNOSIS — E785 Hyperlipidemia, unspecified: Secondary | ICD-10-CM

## 2022-01-07 DIAGNOSIS — I700 Atherosclerosis of aorta: Secondary | ICD-10-CM | POA: Diagnosis not present

## 2022-01-07 DIAGNOSIS — I10 Essential (primary) hypertension: Secondary | ICD-10-CM

## 2022-01-07 NOTE — Patient Instructions (Signed)
Medication Instructions:  ?Your physician recommends that you continue on your current medications as directed. Please refer to the Current Medication list given to you today. ? ?*If you need a refill on your cardiac medications before your next appointment, please call your pharmacy* ? ? ?Lab Work: ?CMET, Fasting Lipid Panel on Thursday 01/10/22 ? ?If you have labs (blood work) drawn today and your tests are completely normal, you will receive your results only by: ?MyChart Message (if you have MyChart) OR ?A paper copy in the mail ?If you have any lab test that is abnormal or we need to change your treatment, we will call you to review the results. ? ? ?Follow-Up: ?At [facility], you and your health needs are our priority.  As part of our continuing mission to provide you with exceptional heart care, we have created designated Provider Care Teams.  These Care Teams include your primary Cardiologist (physician) and Advanced Practice Providers (APPs -  Physician Assistants and Nurse Practitioners) who all work together to provide you with the care you need, when you need it. ? ?We recommend signing up for the patient portal called "MyChart".  Sign up information is provided on this After Visit Summary.  MyChart is used to connect with patients for Virtual Visits (Telemedicine).  Patients are able to view lab/test results, encounter notes, upcoming appointments, etc.  Non-urgent messages can be sent to your provider as well.   ?To learn more about what you can do with MyChart, go to [email or web address].   ? ?Your next appointment:   ?1 year(s) ? ?The format for your next appointment:   ?In Person ? ?Provider:   ?Larae Grooms, MD    ?

## 2022-01-08 DIAGNOSIS — M25571 Pain in right ankle and joints of right foot: Secondary | ICD-10-CM | POA: Diagnosis not present

## 2022-01-10 ENCOUNTER — Other Ambulatory Visit: Payer: Self-pay

## 2022-01-10 ENCOUNTER — Other Ambulatory Visit: Payer: PPO

## 2022-01-10 DIAGNOSIS — E785 Hyperlipidemia, unspecified: Secondary | ICD-10-CM

## 2022-01-10 DIAGNOSIS — I5032 Chronic diastolic (congestive) heart failure: Secondary | ICD-10-CM | POA: Diagnosis not present

## 2022-01-10 DIAGNOSIS — I10 Essential (primary) hypertension: Secondary | ICD-10-CM | POA: Diagnosis not present

## 2022-01-10 DIAGNOSIS — I700 Atherosclerosis of aorta: Secondary | ICD-10-CM

## 2022-01-10 DIAGNOSIS — I359 Nonrheumatic aortic valve disorder, unspecified: Secondary | ICD-10-CM | POA: Diagnosis not present

## 2022-01-10 LAB — LIPID PANEL
Chol/HDL Ratio: 2.9 ratio (ref 0.0–4.4)
Cholesterol, Total: 164 mg/dL (ref 100–199)
HDL: 56 mg/dL (ref >39)
LDL Chol Calc (NIH): 84 mg/dL (ref 0–99)
Triglycerides: 136 mg/dL (ref 0–149)
VLDL Cholesterol Cal: 24 mg/dL (ref 5–40)

## 2022-01-10 LAB — COMPREHENSIVE METABOLIC PANEL
ALT: 20 IU/L (ref 0–32)
AST: 15 IU/L (ref 0–40)
Albumin/Globulin Ratio: 1.8 (ref 1.2–2.2)
Albumin: 4.4 g/dL (ref 3.8–4.8)
Alkaline Phosphatase: 71 IU/L (ref 44–121)
BUN/Creatinine Ratio: 24 (ref 12–28)
BUN: 22 mg/dL (ref 8–27)
Bilirubin Total: 0.8 mg/dL (ref 0.0–1.2)
CO2: 28 mmol/L (ref 20–29)
Calcium: 10.1 mg/dL (ref 8.7–10.3)
Chloride: 101 mmol/L (ref 96–106)
Creatinine, Ser: 0.9 mg/dL (ref 0.57–1.00)
Globulin, Total: 2.5 g/dL (ref 1.5–4.5)
Glucose: 105 mg/dL — ABNORMAL HIGH (ref 70–99)
Potassium: 4 mmol/L (ref 3.5–5.2)
Sodium: 140 mmol/L (ref 134–144)
Total Protein: 6.9 g/dL (ref 6.0–8.5)
eGFR: 69 mL/min/{1.73_m2} (ref >59)

## 2022-01-14 ENCOUNTER — Encounter (HOSPITAL_BASED_OUTPATIENT_CLINIC_OR_DEPARTMENT_OTHER): Payer: Self-pay | Admitting: Emergency Medicine

## 2022-01-14 ENCOUNTER — Emergency Department (HOSPITAL_BASED_OUTPATIENT_CLINIC_OR_DEPARTMENT_OTHER)
Admission: EM | Admit: 2022-01-14 | Discharge: 2022-01-14 | Disposition: A | Discharge status: Home / Self Care | Payer: PPO | Attending: Emergency Medicine | Admitting: Emergency Medicine

## 2022-01-14 ENCOUNTER — Emergency Department (HOSPITAL_BASED_OUTPATIENT_CLINIC_OR_DEPARTMENT_OTHER): Payer: PPO

## 2022-01-14 ENCOUNTER — Other Ambulatory Visit: Payer: Self-pay

## 2022-01-14 DIAGNOSIS — W108XXA Fall (on) (from) other stairs and steps, initial encounter: Secondary | ICD-10-CM | POA: Diagnosis not present

## 2022-01-14 DIAGNOSIS — Z79899 Other long term (current) drug therapy: Secondary | ICD-10-CM | POA: Insufficient documentation

## 2022-01-14 DIAGNOSIS — Z91040 Latex allergy status: Secondary | ICD-10-CM | POA: Diagnosis not present

## 2022-01-14 DIAGNOSIS — S92352A Displaced fracture of fifth metatarsal bone, left foot, initial encounter for closed fracture: Secondary | ICD-10-CM | POA: Insufficient documentation

## 2022-01-14 DIAGNOSIS — I10 Essential (primary) hypertension: Secondary | ICD-10-CM | POA: Diagnosis not present

## 2022-01-14 DIAGNOSIS — S99922A Unspecified injury of left foot, initial encounter: Secondary | ICD-10-CM | POA: Diagnosis present

## 2022-01-14 NOTE — ED Triage Notes (Signed)
Pt reports that she fell down the steps (1 step) yesterday injuring her left foot.  ?

## 2022-01-14 NOTE — ED Provider Notes (Signed)
?[facility] EMERGENC[facility] DEPT ?Provider Note ? ? ?CSN: [id number] ?Arrival date & time: 01/14/22  0818 ? ?  ? ?History ? ?Chief Complaint  ?Patient presents with  ? Fall  ? Foot Injury  ? ? ?Rebecca Terry is a 71 y.o. female. ? ?Patient is a 71 year old female with a history of hypertension, aortic valve disorder, arthritis, hyperlipidemia who is presenting today with complaints of a fall yesterday.  She has been dealing with some posterior tibialis tendinitis and has her right foot in a brace and was walking down the stairs slowly when it caused her left foot to give way and it everted.  She felt a pop and has had significant pain in her left foot since last night.  She has been icing it but noticed bruising and it is painful to walk on.  She denies any other injury.  She has no pain around her knee.  She does feel like her left fifth toe feels a bit tingly. ? ?The history is provided by the patient.  ?Fall ?This is a new problem.  ?Foot Injury ? ?  ? ?Home Medications ?Prior to Admission medications   ?Medication Sig Start Date End Date Taking? Authorizing Provider  ?clobetasol cream (TEMOVATE) 1.69 % 1 application to affected area    [provider]  ?estradiol (ESTRACE) 0.1 MG/GM vaginal cream PLACE 1/2 GRAM VAGINALLY 2 - 3 TIMES WEEKLY AS DIRECTED 10/31/21 10/31/22  Nunzio Cobbs, MD  ?hydrochlorothiazide (HYDRODIURIL) 25 MG tablet 1 tablet in the morning    [provider]  ?losartan (COZAAR) 100 MG tablet Take 1 tablet by mouth once a day 07/03/21     ?Magnesium 400 MG TABS Take 1 tablet by mouth daily.    [provider]  ?Multiple Vitamin (MULTIVITAMIN) tablet Take 1 tablet by mouth daily.    [provider]  ?rosuvastatin (CRESTOR) 10 MG tablet Take 1 tablet (10 mg total) by mouth daily. ?Patient taking differently: Take 10 mg by mouth daily. Taking 2 a week 04/19/21   Jettie Booze, MD  ?TURMERIC PO Take 1 tablet by mouth daily.     [provider]  ?zolpidem (AMBIEN) 5 MG tablet 1-2 tablets at bedtime as needed for insomnia 07/03/21   [provider]  ?   ? ?Allergies    ?Lisinopril, Povidone iodine, Hibiclens [chlorhexidine gluconate], Latex, Nickel, and Penicillins   ? ?Review of Systems   ?Review of Systems ? ?Physical Exam ?Updated Vital Signs ?BP (!) 165/74 (BP Location: Right Arm)   Pulse 69   Temp 98.7 ?F (37.1 ?C) (Oral)   Resp 16   Ht '5\' 4"'$  (1.626 m)   Wt 77.1 kg   LMP 10/21/1992   SpO2 96%   BMI 29.18 kg/m?  ?Physical Exam ?Vitals and nursing note reviewed.  ?Constitutional:   ?   General: She is not in acute distress. ?   Appearance: Normal appearance. She is well-developed.  ?HENT:  ?   Head: Normocephalic and atraumatic.  ?Eyes:  ?   Pupils: Pupils are equal, round, and reactive to light.  ?Cardiovascular:  ?   Rate and Rhythm: Normal rate.  ?   Heart sounds: No murmur heard. ?Pulmonary:  ?   Effort: Pulmonary effort is normal.  ?Musculoskeletal:     ?   General: Tenderness and signs of injury present. Normal range of motion.  ?     Feet: ? ?   Comments: No edema  ?Skin: ?  General: Skin is warm and dry.  ?   Findings: No rash.  ?Neurological:  ?   Mental Status: She is alert and oriented to person, place, and time.  ? ? ?ED Results / Procedures / Treatments   ?Labs ?(all labs ordered are listed, but only abnormal results are displayed) ?Labs Reviewed - No data to display ? ?EKG ?None ? ?Radiology ?DG Foot Complete Left ? ?Result Date: 01/14/2022 ?CLINICAL DATA:  Fall and foot injury. EXAM: LEFT FOOT - COMPLETE 3+ VIEW COMPARISON:  None. FINDINGS: Minimally displaced fracture involving the lateral base of the fifth metatarsal bone. No other fractures are identified. Spurring and degenerative changes along the dorsal aspect of the midfoot. There is a prominent plantar calcaneal spur. IMPRESSION: Minimally displaced fracture involving the base of the fifth metatarsal bone. Electronically Signed   By: Markus Daft M.D.   On: 01/14/2022 08:55   ? ?Procedures ?Procedures  ? ? ?Medications Ordered in ED ?Medications - No data to display ? ?ED Course/ Medical Decision Making/ A&P ?  ?                        ?Medical Decision Making ?Amount and/or Complexity of Data Reviewed ?Radiology: ordered. ? ? ?Patient presenting today after an injury of her foot when she was coming down the stairs yesterday.  She does have ecchymosis and tenderness around the base of the fifth metatarsal.  She has no findings concerning for ankle injury.  Neurovascularly intact.  I independently visualized patient's foot film which shows a minimally displaced fracture at the base of the fifth metatarsal.  Radiology confirmed this finding.  We will place in a postop shoe.  Follow-up with the doctor who is taking care of her other foot. ? ? ? ? ? ? ? ?Final Clinical Impression(s) / ED Diagnoses ?Final diagnoses:  ?Displaced fracture of fifth metatarsal bone, left foot, initial encounter for closed fracture  ? ? ?Rx / DC Orders ?ED Discharge Orders   ? ? None  ? ?  ? ? ?  ?Blanchie Dessert, MD ?01/14/22 0932 ? ?

## 2022-01-14 NOTE — Discharge Instructions (Signed)
This fracture is weightbearing as tolerated.  Wear the postop shoe.  Elevating and icing will help with the pain and swelling.  Follow-up with Dr. Doran Durand. ?

## 2022-01-15 ENCOUNTER — Other Ambulatory Visit (HOSPITAL_COMMUNITY): Payer: Self-pay

## 2022-01-15 DIAGNOSIS — S92354A Nondisplaced fracture of fifth metatarsal bone, right foot, initial encounter for closed fracture: Secondary | ICD-10-CM | POA: Diagnosis not present

## 2022-01-15 DIAGNOSIS — M79672 Pain in left foot: Secondary | ICD-10-CM | POA: Diagnosis not present

## 2022-01-15 DIAGNOSIS — I10 Essential (primary) hypertension: Secondary | ICD-10-CM | POA: Diagnosis not present

## 2022-01-15 DIAGNOSIS — I700 Atherosclerosis of aorta: Secondary | ICD-10-CM | POA: Diagnosis not present

## 2022-01-15 DIAGNOSIS — G4700 Insomnia, unspecified: Secondary | ICD-10-CM | POA: Diagnosis not present

## 2022-01-15 DIAGNOSIS — S92355A Nondisplaced fracture of fifth metatarsal bone, left foot, initial encounter for closed fracture: Secondary | ICD-10-CM | POA: Diagnosis not present

## 2022-01-15 MED ORDER — ZOLPIDEM TARTRATE 5 MG PO TABS
ORAL_TABLET | ORAL | 0 refills | Status: DC
  Filled 2022-01-15: qty 15, 15d supply, fill #0

## 2022-01-15 MED ORDER — METOPROLOL SUCCINATE ER 25 MG PO TB24
ORAL_TABLET | ORAL | 0 refills | Status: DC
  Filled 2022-01-15: qty 30, 30d supply, fill #0

## 2022-01-16 ENCOUNTER — Other Ambulatory Visit (HOSPITAL_COMMUNITY): Payer: Self-pay

## 2022-01-16 DIAGNOSIS — L299 Pruritus, unspecified: Secondary | ICD-10-CM | POA: Diagnosis not present

## 2022-01-16 DIAGNOSIS — L309 Dermatitis, unspecified: Secondary | ICD-10-CM | POA: Diagnosis not present

## 2022-01-16 MED ORDER — PREDNISONE 20 MG PO TABS
ORAL_TABLET | ORAL | 0 refills | Status: DC
  Filled 2022-01-16: qty 8, 4d supply, fill #0

## 2022-01-16 MED ORDER — CLOBETASOL PROPIONATE 0.05 % EX OINT
TOPICAL_OINTMENT | CUTANEOUS | 0 refills | Status: AC
  Filled 2022-01-16: qty 30, 14d supply, fill #0

## 2022-01-17 DIAGNOSIS — I10 Essential (primary) hypertension: Secondary | ICD-10-CM | POA: Diagnosis not present

## 2022-01-17 DIAGNOSIS — G4733 Obstructive sleep apnea (adult) (pediatric): Secondary | ICD-10-CM | POA: Diagnosis not present

## 2022-01-17 DIAGNOSIS — E785 Hyperlipidemia, unspecified: Secondary | ICD-10-CM | POA: Diagnosis not present

## 2022-01-22 ENCOUNTER — Other Ambulatory Visit (HOSPITAL_COMMUNITY): Payer: Self-pay

## 2022-01-22 DIAGNOSIS — M25571 Pain in right ankle and joints of right foot: Secondary | ICD-10-CM | POA: Diagnosis not present

## 2022-01-22 MED ORDER — LOSARTAN POTASSIUM 100 MG PO TABS
ORAL_TABLET | ORAL | 0 refills | Status: DC
  Filled 2022-01-22: qty 90, 90d supply, fill #0

## 2022-01-22 MED ORDER — HYDROCHLOROTHIAZIDE 25 MG PO TABS
ORAL_TABLET | ORAL | 0 refills | Status: DC
  Filled 2022-01-22: qty 90, 90d supply, fill #0

## 2022-01-23 ENCOUNTER — Encounter: Payer: Self-pay | Admitting: Obstetrics and Gynecology

## 2022-01-23 DIAGNOSIS — G4733 Obstructive sleep apnea (adult) (pediatric): Secondary | ICD-10-CM | POA: Diagnosis not present

## 2022-01-31 ENCOUNTER — Other Ambulatory Visit (HOSPITAL_COMMUNITY): Payer: Self-pay

## 2022-02-04 ENCOUNTER — Other Ambulatory Visit (HOSPITAL_COMMUNITY): Payer: Self-pay

## 2022-02-04 DIAGNOSIS — R42 Dizziness and giddiness: Secondary | ICD-10-CM | POA: Diagnosis not present

## 2022-02-04 DIAGNOSIS — R7301 Impaired fasting glucose: Secondary | ICD-10-CM | POA: Diagnosis not present

## 2022-02-04 DIAGNOSIS — I10 Essential (primary) hypertension: Secondary | ICD-10-CM | POA: Diagnosis not present

## 2022-02-04 DIAGNOSIS — R002 Palpitations: Secondary | ICD-10-CM | POA: Diagnosis not present

## 2022-02-04 MED ORDER — LOSARTAN POTASSIUM-HCTZ 100-25 MG PO TABS
ORAL_TABLET | ORAL | 1 refills | Status: DC
  Filled 2022-02-04 – 2022-03-11 (×2): qty 90, 90d supply, fill #0
  Filled 2022-07-25: qty 90, 90d supply, fill #1

## 2022-02-04 MED ORDER — METOPROLOL SUCCINATE ER 25 MG PO TB24
25.0000 mg | ORAL_TABLET | Freq: Every day | ORAL | 1 refills | Status: DC
  Filled 2022-02-04 – 2022-02-07 (×2): qty 90, 90d supply, fill #0
  Filled 2022-05-13: qty 90, 90d supply, fill #1

## 2022-02-07 ENCOUNTER — Other Ambulatory Visit (HOSPITAL_COMMUNITY): Payer: Self-pay

## 2022-02-15 DIAGNOSIS — M67961 Unspecified disorder of synovium and tendon, right lower leg: Secondary | ICD-10-CM | POA: Diagnosis not present

## 2022-02-15 DIAGNOSIS — S92354A Nondisplaced fracture of fifth metatarsal bone, right foot, initial encounter for closed fracture: Secondary | ICD-10-CM | POA: Diagnosis not present

## 2022-02-15 DIAGNOSIS — M25571 Pain in right ankle and joints of right foot: Secondary | ICD-10-CM | POA: Diagnosis not present

## 2022-02-19 NOTE — Progress Notes (Signed)
?  ?Cardiology Office Note ? ? ?Date:  02/20/2022  ? ?ID:  Rebecca Terry, DOB [birth year], MRN [id number] ? ?PCP:  Donald Prose, MD  ? ? ?No chief complaint on file. ? ?Aortic insufficiency ? ?Wt Readings from Last 3 Encounters:  ?02/20/22 172 lb (78 kg)  ?01/14/22 170 lb (77.1 kg)  ?01/07/22 170 lb (77.1 kg)  ?  ? ?  ?History of Present Illness: ?Rebecca Terry is a 71 y.o. female    with HTN and aortic insufficiency.  Parents died at young age; father with MI (age 64).  Mother (asthma, age 41). ?  ?She works in the operating room.  She was having trouble losing weight and was considering intermittent fasting at the visit in 2020. ?  ?2015 echo: ?Left ventricle: The cavity size was normal. Wall thickness was ?  normal. Systolic function was normal. The estimated ejection ?  fraction was in the range of 55% to 60%. Wall motion was normal; ?  there were no regional wall motion abnormalities. Doppler ?  parameters are consistent with abnormal left ventricular ?  relaxation (grade 1 diastolic dysfunction). The E/e&' ratio is ?  between 8-15, suggesting indeterminate LV filling pressure. ?- Aortic valve: Structurally normal valve. Trileaflet. There was ?  mild regurgitation. ?- Mitral valve: Mildly thickened leaflets . There was mild ?  regurgitation. ?- Left atrium: The atrium was normal in size. ? ?Impressions: ? ?- Compared to the prior echo in 2012, there is persistent mild AI ?  and mild MR. The LVEF has improved to 55-60%. ?  ? She has gone to part time at work.  She has been vaccinated against COVID.  ?  ?2021 echo showed: "Left ventricular ejection fraction, by estimation, is 50%. The left  ?ventricle has low normal function. The left ventricle has no regional wall  ?motion abnormalities. There is mild left ventricular hypertrophy. Left  ?ventricular diastolic parameters are  ? consistent with Grade II diastolic dysfunction (pseudonormalization).  ? 2. Right ventricular systolic function is normal. The  right ventricular  ?size is normal. Tricuspid regurgitation signal is inadequate for assessing  ?PA pressure.  ? 3. The mitral valve is normal in structure. Mild mitral valve  ?regurgitation. No evidence of mitral stenosis.  ? 4. The aortic valve is tricuspid. Aortic valve regurgitation is mild. No  ?aortic stenosis is present.  ? 5. The inferior vena cava is normal in size with greater than 50%  ?respiratory variability, suggesting right atrial pressure of 3 mmHg. " ?  ?Noted to have some aortic atherosclerosis on abdominal CT, and atherosclerosis on shoulder xray.  Had coronary calcium score of 0 in 2020. ? 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 ? ?Denies : Chest pain. Dizziness. Leg edema. Nitroglycerin use. Orthopnea.   Paroxysmal nocturnal dyspnea. Shortness of breath. Syncope.   ? ?Feeling premature beats more frequently. Toprol XL 25 was added.  HR at the 60 range and BP 120 at home. ? ?Decreased frequency of rosuvastatin to 3x /week. ? ?Past Medical History:  ?Diagnosis Date  ? Abnormal Pap smear of cervix   ? 09/2013 Ascus:Pos HR HPV, 03/2014 Ascus:Pos HR HPV;colpo 05/2014 LGSIL of cx and VIN 1of perianal region, 08-09-16 neg HPV HR +  ? Aortic valve disorders   ? Arthritis   ? Diverticulosis   ? Elevated bilirubin 08/09/2016  ? level 1.4  ? Essential hypertension, benign   ? Fibroid   ?  posterior  ? Herpes genitalis in women   ? History of MRSA infection 07/2015  ? culture positive [facility]  ? Hyperlipidemia   ? Hypertension   ? Osteopenia 2018  ? hip and spine  ? Primary localized osteoarthritis of left knee   ? Rectocele   ? Urinary incontinence   ? ? ?Past Surgical History:  ?Procedure Laterality Date  ? arthroscopic knee Left 2001  ? BILATERAL OOPHORECTOMY  2010  ? Dr. Quincy Simmonds  ? CARPAL TUNNEL RELEASE Bilateral   ? COLONOSCOPY    ? DIAGNOSTIC LAPAROSCOPY    ? PELVIC LAPAROSCOPY    ? x2--ectopics  ? TOTAL KNEE ARTHROPLASTY Left 12/11/2015  ? Procedure:  LEFT TOTAL KNEE ARTHROPLASTY;  Surgeon: Elsie Saas, MD;  Location: [facility];  Service: Orthopedics;  Laterality: Left;  ? ? ? ?Current Outpatient Medications  ?Medication Sig Dispense Refill  ? aspirin EC 81 MG tablet Take 81 mg by mouth daily. Swallow whole.    ? clobetasol cream (TEMOVATE) 6.23 % 1 application to affected area    ? clobetasol ointment (TEMOVATE) 0.05 % Apply externally Twice a day for 14 days 30 g 0  ? estradiol (ESTRACE) 0.1 MG/GM vaginal cream PLACE 1/2 GRAM VAGINALLY 2 - 3 TIMES WEEKLY AS DIRECTED 42.5 g 1  ? hydrochlorothiazide (HYDRODIURIL) 25 MG tablet 1 tablet in the morning    ? losartan (COZAAR) 100 MG tablet Take 1 tablet by mouth once a day 90 tablet 1  ? losartan-hydrochlorothiazide (HYZAAR) 100-25 MG tablet Take 1 tablet by mouth once a day 90 tablet 1  ? Magnesium 400 MG TABS Take 1 tablet by mouth daily.    ? metoprolol succinate (TOPROL-XL) 25 MG 24 hr tablet Take 1 tablet by mouth daily. 90 tablet 1  ? Multiple Vitamin (MULTIVITAMIN) tablet Take 1 tablet by mouth daily.    ? rosuvastatin (CRESTOR) 10 MG tablet Take 1 tablet (10 mg total) by mouth daily. (Patient taking differently: Take 10 mg by mouth daily. Taking 2 a week) 30 tablet 11  ? TURMERIC PO Take 1 tablet by mouth daily.    ? zolpidem (AMBIEN) 5 MG tablet 1-2 tablets at bedtime as needed for insomnia    ? zolpidem (AMBIEN) 5 MG tablet Take 1 to 2 tablets by mouth at bedtime as needed for insomnia 15 tablet 0  ? predniSONE (DELTASONE) 20 MG tablet Take 2 tablets by mouth daily for two days and then one tablet daily for 4 days (Patient not taking: Reported on 02/20/2022) 8 tablet 0  ? ?No current facility-administered medications for this visit.  ? ? ?Allergies:   Lisinopril, Povidone iodine, Hibiclens [chlorhexidine gluconate], Latex, Nickel, and Penicillins  ? ? ?Social History:  The patient  reports that she quit smoking about 28 years ago. Her smoking use included cigarettes. She has a 10.00 pack-year smoking history.  She has never used smokeless tobacco. She reports current alcohol use of about 5.0 standard drinks per week. She reports that she does not use drugs.  ? ?Family History:  The patient's family history includes Asthma in her mother; Congestive Heart Failure in her father; Heart attack in her father; Multiple sclerosis in her father.  ? ? ?ROS:  Please see the history of present illness.   Otherwise, review of systems are positive for no exercise due to leg/foot/ankle problems.   All other systems are reviewed and negative.  ? ? ?PHYSICAL EXAM: ?VS:  BP 102/80   Pulse (!) 58  Ht '5\' 4"'$  (1.626 m)   Wt 172 lb (78 kg)   LMP 10/21/1992   SpO2 96%   BMI 29.52 kg/m?  , BMI Body mass index is 29.52 kg/m?. ?GEN: Well nourished, well developed, in no acute distress ?HEENT: normal ?Neck: no JVD, carotid bruits, or masses ?Cardiac: RRR; no murmurs, rubs, or gallops,no edema  ?Respiratory:  clear to auscultation bilaterally, normal work of breathing ?GI: soft, nontender, nondistended, + BS ?MS: no deformity or atrophy ?Skin: warm and dry, no rash ?Neuro:  Strength and sensation are intact ?Psych: euthymic mood, full affect ? ? ?EKG:   ?The ekg ordered today demonstrates SB, no ST changes ? ? ?Recent Labs: ?01/10/2022: ALT 20; BUN 22; Creatinine, Ser 0.90; Potassium 4.0; Sodium 140  ? ?Lipid Panel ?   ?Component Value Date/Time  ? CHOL 164 01/10/2022 0736  ? TRIG 136 01/10/2022 0736  ? HDL 56 01/10/2022 0736  ? CHOLHDL 2.9 01/10/2022 0736  ? CHOLHDL 3.0 08/09/2016 1500  ? VLDL 22 08/09/2016 1500  ? [place] 84 01/10/2022 0736  ? ?  ?Other studies Reviewed: ?Additional studies/ records that were reviewed today with results demonstrating: labs reviewed.  LDL 75, triglycerides 225, A1c 5.8, HDL 59, creatinine 0.8 in April 2023 ? ? ?ASSESSMENT AND PLAN: ? ?Aortic insufficiency: No CHF.  Check echo to further eval.   ?Overweight: Gained some weight with foot injuries. ?Aortic atherosclerosis: Continue rosuvastatin. Taking baby  aspirin ?HTN: The current medical regimen is effective;  continue present plan and medications. ?Obesity: Whole food, plant based diet. She eats a [place] ?Diastolic dysfunction/chronic diastolic heart failure: S

## 2022-02-20 ENCOUNTER — Encounter: Payer: Self-pay | Admitting: Interventional Cardiology

## 2022-02-20 ENCOUNTER — Ambulatory Visit: Payer: PPO | Admitting: Interventional Cardiology

## 2022-02-20 VITALS — BP 102/80 | HR 58 | Ht 64.0 in | Wt 172.0 lb

## 2022-02-20 DIAGNOSIS — E785 Hyperlipidemia, unspecified: Secondary | ICD-10-CM | POA: Diagnosis not present

## 2022-02-20 DIAGNOSIS — I359 Nonrheumatic aortic valve disorder, unspecified: Secondary | ICD-10-CM | POA: Diagnosis not present

## 2022-02-20 DIAGNOSIS — I10 Essential (primary) hypertension: Secondary | ICD-10-CM | POA: Diagnosis not present

## 2022-02-20 DIAGNOSIS — E669 Obesity, unspecified: Secondary | ICD-10-CM | POA: Diagnosis not present

## 2022-02-20 DIAGNOSIS — I5032 Chronic diastolic (congestive) heart failure: Secondary | ICD-10-CM | POA: Diagnosis not present

## 2022-02-20 DIAGNOSIS — I700 Atherosclerosis of aorta: Secondary | ICD-10-CM | POA: Diagnosis not present

## 2022-02-20 NOTE — Patient Instructions (Signed)
Medication Instructions:  Your physician recommends that you continue on your current medications as directed. Please refer to the Current Medication list given to you today.  *If you need a refill on your cardiac medications before your next appointment, please call your pharmacy*   Lab Work: none If you have labs (blood work) drawn today and your tests are completely normal, you will receive your results only by: MyChart Message (if you have MyChart) OR A paper copy in the mail If you have any lab test that is abnormal or we need to change your treatment, we will call you to review the results.   Testing/Procedures: Your physician has requested that you have an echocardiogram. Echocardiography is a painless test that uses sound waves to create images of your heart. It provides your doctor with information about the size and shape of your heart and how well your heart's chambers and valves are working. This procedure takes approximately one hour. There are no restrictions for this procedure.    Follow-Up: At [facility], you and your health needs are our priority.  As part of our continuing mission to provide you with exceptional heart care, we have created designated Provider Care Teams.  These Care Teams include your primary Cardiologist (physician) and Advanced Practice Providers (APPs -  Physician Assistants and Nurse Practitioners) who all work together to provide you with the care you need, when you need it.  We recommend signing up for the patient portal called "MyChart".  Sign up information is provided on this After Visit Summary.  MyChart is used to connect with patients for Virtual Visits (Telemedicine).  Patients are able to view lab/test results, encounter notes, upcoming appointments, etc.  Non-urgent messages can be sent to your provider as well.   To learn more about what you can do with MyChart, go to [email or web address].    Your next appointment:   12  month(s)  The format for your next appointment:   In Person  Provider:   Jayadeep Varanasi, MD     Other Instructions    Important Information About Sugar       

## 2022-02-22 DIAGNOSIS — M25571 Pain in right ankle and joints of right foot: Secondary | ICD-10-CM | POA: Diagnosis not present

## 2022-02-22 DIAGNOSIS — G4733 Obstructive sleep apnea (adult) (pediatric): Secondary | ICD-10-CM | POA: Diagnosis not present

## 2022-03-04 DIAGNOSIS — M6701 Short Achilles tendon (acquired), right ankle: Secondary | ICD-10-CM | POA: Diagnosis not present

## 2022-03-04 DIAGNOSIS — S92354A Nondisplaced fracture of fifth metatarsal bone, right foot, initial encounter for closed fracture: Secondary | ICD-10-CM | POA: Diagnosis not present

## 2022-03-04 DIAGNOSIS — M67961 Unspecified disorder of synovium and tendon, right lower leg: Secondary | ICD-10-CM | POA: Diagnosis not present

## 2022-03-05 ENCOUNTER — Ambulatory Visit (HOSPITAL_COMMUNITY): Payer: PPO | Attending: Internal Medicine

## 2022-03-05 DIAGNOSIS — I5032 Chronic diastolic (congestive) heart failure: Secondary | ICD-10-CM | POA: Insufficient documentation

## 2022-03-05 DIAGNOSIS — I359 Nonrheumatic aortic valve disorder, unspecified: Secondary | ICD-10-CM | POA: Diagnosis not present

## 2022-03-05 LAB — ECHOCARDIOGRAM COMPLETE
Area-P 1/2: 4.24 cm2
P 1/2 time: 644 msec
S' Lateral: 3.3 cm

## 2022-03-11 ENCOUNTER — Other Ambulatory Visit (HOSPITAL_COMMUNITY): Payer: Self-pay

## 2022-03-12 DIAGNOSIS — G4733 Obstructive sleep apnea (adult) (pediatric): Secondary | ICD-10-CM | POA: Diagnosis not present

## 2022-03-25 DIAGNOSIS — G4733 Obstructive sleep apnea (adult) (pediatric): Secondary | ICD-10-CM | POA: Diagnosis not present

## 2022-04-08 ENCOUNTER — Other Ambulatory Visit (HOSPITAL_COMMUNITY): Payer: Self-pay

## 2022-04-09 ENCOUNTER — Other Ambulatory Visit (HOSPITAL_COMMUNITY): Payer: Self-pay

## 2022-04-12 ENCOUNTER — Other Ambulatory Visit (HOSPITAL_COMMUNITY): Payer: Self-pay | Admitting: Orthopedic Surgery

## 2022-04-29 DIAGNOSIS — Z6831 Body mass index (BMI) 31.0-31.9, adult: Secondary | ICD-10-CM | POA: Diagnosis not present

## 2022-04-29 DIAGNOSIS — I10 Essential (primary) hypertension: Secondary | ICD-10-CM | POA: Diagnosis not present

## 2022-04-29 DIAGNOSIS — R635 Abnormal weight gain: Secondary | ICD-10-CM | POA: Diagnosis not present

## 2022-04-29 DIAGNOSIS — M8588 Other specified disorders of bone density and structure, other site: Secondary | ICD-10-CM | POA: Diagnosis not present

## 2022-04-29 DIAGNOSIS — G4733 Obstructive sleep apnea (adult) (pediatric): Secondary | ICD-10-CM | POA: Diagnosis not present

## 2022-05-13 ENCOUNTER — Other Ambulatory Visit (HOSPITAL_COMMUNITY): Payer: Self-pay

## 2022-05-14 ENCOUNTER — Other Ambulatory Visit (HOSPITAL_COMMUNITY): Payer: Self-pay

## 2022-05-16 DIAGNOSIS — M67961 Unspecified disorder of synovium and tendon, right lower leg: Secondary | ICD-10-CM | POA: Diagnosis not present

## 2022-05-22 DIAGNOSIS — G4733 Obstructive sleep apnea (adult) (pediatric): Secondary | ICD-10-CM | POA: Diagnosis not present

## 2022-05-22 DIAGNOSIS — M67961 Unspecified disorder of synovium and tendon, right lower leg: Secondary | ICD-10-CM | POA: Diagnosis not present

## 2022-06-06 ENCOUNTER — Other Ambulatory Visit: Payer: Self-pay

## 2022-06-06 ENCOUNTER — Other Ambulatory Visit (HOSPITAL_COMMUNITY): Payer: Self-pay

## 2022-06-06 ENCOUNTER — Other Ambulatory Visit: Payer: Self-pay | Admitting: Interventional Cardiology

## 2022-06-06 MED ORDER — ZOLPIDEM TARTRATE 5 MG PO TABS
ORAL_TABLET | ORAL | 0 refills | Status: DC
  Filled 2022-06-06: qty 15, 15d supply, fill #0

## 2022-06-06 MED ORDER — ROSUVASTATIN CALCIUM 10 MG PO TABS
10.0000 mg | ORAL_TABLET | Freq: Every day | ORAL | 2 refills | Status: DC
  Filled 2022-06-06: qty 60, 60d supply, fill #0
  Filled 2022-09-16: qty 60, 60d supply, fill #1
  Filled 2023-01-11: qty 60, 60d supply, fill #2

## 2022-06-10 DIAGNOSIS — G4733 Obstructive sleep apnea (adult) (pediatric): Secondary | ICD-10-CM | POA: Diagnosis not present

## 2022-06-13 ENCOUNTER — Encounter (HOSPITAL_BASED_OUTPATIENT_CLINIC_OR_DEPARTMENT_OTHER): Payer: Self-pay | Admitting: Orthopedic Surgery

## 2022-06-13 ENCOUNTER — Other Ambulatory Visit: Payer: Self-pay

## 2022-06-17 ENCOUNTER — Encounter (HOSPITAL_BASED_OUTPATIENT_CLINIC_OR_DEPARTMENT_OTHER)
Admission: RE | Admit: 2022-06-17 | Discharge: 2022-06-17 | Disposition: A | Discharge status: Home / Self Care | Payer: PPO | Source: Ambulatory Visit | Attending: Orthopedic Surgery | Admitting: Orthopedic Surgery

## 2022-06-17 DIAGNOSIS — Z79899 Other long term (current) drug therapy: Secondary | ICD-10-CM | POA: Diagnosis not present

## 2022-06-17 DIAGNOSIS — Z01818 Encounter for other preprocedural examination: Secondary | ICD-10-CM | POA: Insufficient documentation

## 2022-06-17 LAB — BASIC METABOLIC PANEL
Anion gap: 6 (ref 5–15)
BUN: 20 mg/dL (ref 8–23)
CO2: 27 mmol/L (ref 22–32)
Calcium: 10 mg/dL (ref 8.9–10.3)
Chloride: 107 mmol/L (ref 98–111)
Creatinine, Ser: 0.96 mg/dL (ref 0.44–1.00)
GFR, Estimated: >60 mL/min (ref >60)
Glucose, Bld: 87 mg/dL (ref 70–99)
Potassium: 4.3 mmol/L (ref 3.5–5.1)
Sodium: 140 mmol/L (ref 135–145)

## 2022-06-17 NOTE — Progress Notes (Signed)

## 2022-06-20 ENCOUNTER — Other Ambulatory Visit (HOSPITAL_COMMUNITY): Payer: Self-pay

## 2022-06-20 ENCOUNTER — Ambulatory Visit (HOSPITAL_BASED_OUTPATIENT_CLINIC_OR_DEPARTMENT_OTHER): Payer: PPO

## 2022-06-20 ENCOUNTER — Ambulatory Visit (HOSPITAL_BASED_OUTPATIENT_CLINIC_OR_DEPARTMENT_OTHER): Payer: PPO | Admitting: Anesthesiology

## 2022-06-20 ENCOUNTER — Other Ambulatory Visit: Payer: Self-pay

## 2022-06-20 ENCOUNTER — Ambulatory Visit (HOSPITAL_BASED_OUTPATIENT_CLINIC_OR_DEPARTMENT_OTHER)
Admission: RE | Admit: 2022-06-20 | Discharge: 2022-06-20 | Disposition: A | Discharge status: Home / Self Care | Payer: PPO | Attending: Orthopedic Surgery | Admitting: Orthopedic Surgery

## 2022-06-20 ENCOUNTER — Encounter (HOSPITAL_BASED_OUTPATIENT_CLINIC_OR_DEPARTMENT_OTHER): Payer: Self-pay | Admitting: Orthopedic Surgery

## 2022-06-20 ENCOUNTER — Encounter (HOSPITAL_BASED_OUTPATIENT_CLINIC_OR_DEPARTMENT_OTHER)
Admission: RE | Disposition: A | Discharge status: Home / Self Care | Payer: Self-pay | Source: Home / Self Care | Attending: Orthopedic Surgery

## 2022-06-20 DIAGNOSIS — S93421A Sprain of deltoid ligament of right ankle, initial encounter: Secondary | ICD-10-CM

## 2022-06-20 DIAGNOSIS — I10 Essential (primary) hypertension: Secondary | ICD-10-CM | POA: Diagnosis not present

## 2022-06-20 DIAGNOSIS — S93491A Sprain of other ligament of right ankle, initial encounter: Secondary | ICD-10-CM | POA: Diagnosis not present

## 2022-06-20 DIAGNOSIS — M67823 Other specified disorders of tendon, right elbow: Secondary | ICD-10-CM

## 2022-06-20 DIAGNOSIS — M76821 Posterior tibial tendinitis, right leg: Secondary | ICD-10-CM | POA: Diagnosis not present

## 2022-06-20 DIAGNOSIS — Z87891 Personal history of nicotine dependence: Secondary | ICD-10-CM | POA: Insufficient documentation

## 2022-06-20 DIAGNOSIS — Z79899 Other long term (current) drug therapy: Secondary | ICD-10-CM | POA: Insufficient documentation

## 2022-06-20 DIAGNOSIS — M216X1 Other acquired deformities of right foot: Secondary | ICD-10-CM | POA: Diagnosis not present

## 2022-06-20 DIAGNOSIS — X58XXXA Exposure to other specified factors, initial encounter: Secondary | ICD-10-CM | POA: Diagnosis not present

## 2022-06-20 DIAGNOSIS — M6701 Short Achilles tendon (acquired), right ankle: Secondary | ICD-10-CM | POA: Insufficient documentation

## 2022-06-20 DIAGNOSIS — G4730 Sleep apnea, unspecified: Secondary | ICD-10-CM | POA: Diagnosis not present

## 2022-06-20 DIAGNOSIS — G8918 Other acute postprocedural pain: Secondary | ICD-10-CM | POA: Diagnosis not present

## 2022-06-20 DIAGNOSIS — M1990 Unspecified osteoarthritis, unspecified site: Secondary | ICD-10-CM | POA: Insufficient documentation

## 2022-06-20 HISTORY — PX: CALCANEAL OSTEOTOMY: SHX1281

## 2022-06-20 HISTORY — PX: GASTROCNEMIUS RECESSION: SHX863

## 2022-06-20 HISTORY — DX: Sleep apnea, unspecified: G47.30

## 2022-06-20 SURGERY — OSTEOTOMY, CALCANEUS
Anesthesia: Regional | Site: Leg Lower | Laterality: Right

## 2022-06-20 MED ORDER — PROPOFOL 500 MG/50ML IV EMUL
INTRAVENOUS | Status: DC | PRN
  Administered 2022-06-20: 25 ug/kg/min via INTRAVENOUS

## 2022-06-20 MED ORDER — PROPOFOL 10 MG/ML IV BOLUS
INTRAVENOUS | Status: DC | PRN
  Administered 2022-06-20: 200 mg via INTRAVENOUS

## 2022-06-20 MED ORDER — CEFAZOLIN SODIUM-DEXTROSE 2-4 GM/100ML-% IV SOLN
2.0000 g | INTRAVENOUS | Status: AC
  Administered 2022-06-20: 2 g via INTRAVENOUS

## 2022-06-20 MED ORDER — RIVAROXABAN 10 MG PO TABS
10.0000 mg | ORAL_TABLET | Freq: Every day | ORAL | 0 refills | Status: DC
  Filled 2022-06-20: qty 14, 14d supply, fill #0

## 2022-06-20 MED ORDER — MIDAZOLAM HCL 2 MG/2ML IJ SOLN
INTRAMUSCULAR | Status: AC
  Filled 2022-06-20: qty 2

## 2022-06-20 MED ORDER — OXYCODONE HCL 5 MG PO TABS
5.0000 mg | ORAL_TABLET | Freq: Four times a day (QID) | ORAL | 0 refills | Status: AC | PRN
  Filled 2022-06-20: qty 20, 5d supply, fill #0

## 2022-06-20 MED ORDER — MIDAZOLAM HCL 2 MG/2ML IJ SOLN
2.0000 mg | Freq: Once | INTRAMUSCULAR | Status: AC
  Administered 2022-06-20: 2 mg via INTRAVENOUS

## 2022-06-20 MED ORDER — ACETAMINOPHEN 10 MG/ML IV SOLN: 1000.0000 mg | Freq: Once | INTRAVENOUS | Status: DC | PRN

## 2022-06-20 MED ORDER — DEXAMETHASONE SODIUM PHOSPHATE 10 MG/ML IJ SOLN
INTRAMUSCULAR | Status: DC | PRN
  Administered 2022-06-20: 4 mg via INTRAVENOUS

## 2022-06-20 MED ORDER — CEFAZOLIN SODIUM-DEXTROSE 2-4 GM/100ML-% IV SOLN
INTRAVENOUS | Status: AC
  Filled 2022-06-20: qty 100

## 2022-06-20 MED ORDER — FENTANYL CITRATE (PF) 100 MCG/2ML IJ SOLN
INTRAMUSCULAR | Status: AC
  Filled 2022-06-20: qty 2

## 2022-06-20 MED ORDER — ROPIVACAINE HCL 5 MG/ML IJ SOLN
INTRAMUSCULAR | Status: DC | PRN
  Administered 2022-06-20: 40 mL via PERINEURAL

## 2022-06-20 MED ORDER — SODIUM CHLORIDE 0.9 % IV SOLN: INTRAVENOUS | Status: DC

## 2022-06-20 MED ORDER — VANCOMYCIN HCL 500 MG IV SOLR
INTRAVENOUS | Status: DC | PRN
  Administered 2022-06-20: 500 mg via TOPICAL

## 2022-06-20 MED ORDER — EPHEDRINE SULFATE (PRESSORS) 50 MG/ML IJ SOLN
INTRAMUSCULAR | Status: DC | PRN
  Administered 2022-06-20: 10 mg via INTRAVENOUS

## 2022-06-20 MED ORDER — FENTANYL CITRATE (PF) 100 MCG/2ML IJ SOLN: 25.0000 ug | INTRAMUSCULAR | Status: DC | PRN

## 2022-06-20 MED ORDER — DEXAMETHASONE SODIUM PHOSPHATE 10 MG/ML IJ SOLN
INTRAMUSCULAR | Status: DC | PRN
  Administered 2022-06-20: 10 mg

## 2022-06-20 MED ORDER — LIDOCAINE 2% (20 MG/ML) 5 ML SYRINGE
INTRAMUSCULAR | Status: DC | PRN
  Administered 2022-06-20: 30 mg via INTRAVENOUS

## 2022-06-20 MED ORDER — LACTATED RINGERS IV SOLN: INTRAVENOUS | Status: DC

## 2022-06-20 MED ORDER — FENTANYL CITRATE (PF) 100 MCG/2ML IJ SOLN
50.0000 ug | Freq: Once | INTRAMUSCULAR | Status: AC
  Administered 2022-06-20: 50 ug via INTRAVENOUS

## 2022-06-20 MED ORDER — ONDANSETRON HCL 4 MG/2ML IJ SOLN
INTRAMUSCULAR | Status: DC | PRN
  Administered 2022-06-20: 4 mg via INTRAVENOUS

## 2022-06-20 MED ORDER — 0.9 % SODIUM CHLORIDE (POUR BTL) OPTIME
TOPICAL | Status: DC | PRN
  Administered 2022-06-20: 200 mL

## 2022-06-20 SURGICAL SUPPLY — 88 items
BANDAGE ESMARK 6X9 LF (GAUZE/BANDAGES/DRESSINGS) IMPLANT
BLADE AVERAGE 25X9 (BLADE) IMPLANT
BLADE MICRO SAGITTAL (BLADE) ×4 IMPLANT
BLADE SURG 15 STRL LF DISP TIS (BLADE) ×12 IMPLANT
BLADE SURG 15 STRL SS (BLADE) ×12
BNDG CMPR 9X6 STRL LF SNTH (GAUZE/BANDAGES/DRESSINGS)
BNDG ELASTIC 4X5.8 VLCR STR LF (GAUZE/BANDAGES/DRESSINGS) ×4 IMPLANT
BNDG ELASTIC 6X5.8 VLCR STR LF (GAUZE/BANDAGES/DRESSINGS) ×4 IMPLANT
BNDG ESMARK 6X9 LF (GAUZE/BANDAGES/DRESSINGS)
BOOT STEPPER DURA LG (SOFTGOODS) IMPLANT
BOOT STEPPER DURA MED (SOFTGOODS) IMPLANT
BOOT STEPPER DURA XLG (SOFTGOODS) IMPLANT
BRUSH SCRUB EZ 1% IODOPHOR (MISCELLANEOUS) IMPLANT
BRUSH SCRUB EZ PLAIN DRY (MISCELLANEOUS) ×1 IMPLANT
CANISTER SUCT 1200ML W/VALVE (MISCELLANEOUS) ×4 IMPLANT
COVER BACK TABLE 60X90IN (DRAPES) ×4 IMPLANT
CUFF TOURN SGL QUICK 34 (TOURNIQUET CUFF)
CUFF TRNQT CYL 34X4.125X (TOURNIQUET CUFF) IMPLANT
DRAPE EXTREMITY T 121X128X90 (DISPOSABLE) ×4 IMPLANT
DRAPE OEC MINIVIEW 54X84 (DRAPES) ×4 IMPLANT
DRAPE U-SHAPE 47X51 STRL (DRAPES) ×4 IMPLANT
DRSG MEPITEL 4X7.2 (GAUZE/BANDAGES/DRESSINGS) ×4 IMPLANT
ELECT REM PT RETURN 9FT ADLT (ELECTROSURGICAL) ×4
ELECTRODE REM PT RTRN 9FT ADLT (ELECTROSURGICAL) ×4 IMPLANT
GAUZE PAD ABD 8X10 STRL (GAUZE/BANDAGES/DRESSINGS) ×8 IMPLANT
GAUZE SPONGE 4X4 12PLY STRL (GAUZE/BANDAGES/DRESSINGS) ×4 IMPLANT
GLOVE BIOGEL PI IND STRL 7.0 (GLOVE) ×2 IMPLANT
GLOVE BIOGEL PI IND STRL 8 (GLOVE) ×7 IMPLANT
GLOVE BIOGEL PI INDICATOR 7.0 (GLOVE) ×8
GLOVE BIOGEL PI INDICATOR 8 (GLOVE) ×4
GLOVE SURG SS PI 7.0 STRL IVOR (GLOVE) ×1 IMPLANT
GLOVE SURG SYN 8.0 (GLOVE) ×8 IMPLANT
GLOVE SURG SYN 8.0 PF PI (GLOVE) ×6 IMPLANT
GOWN STRL REUS W/ TWL LRG LVL3 (GOWN DISPOSABLE) ×4 IMPLANT
GOWN STRL REUS W/ TWL XL LVL3 (GOWN DISPOSABLE) ×7 IMPLANT
GOWN STRL REUS W/TWL LRG LVL3 (GOWN DISPOSABLE) ×4
GOWN STRL REUS W/TWL XL LVL3 (GOWN DISPOSABLE) ×4
K-WIRE TROC 1.25X150 (WIRE) ×4
KIT ACCESSORY DRILL 5 (KITS) ×1 IMPLANT
KWIRE TROC 1.25X150 (WIRE) ×1 IMPLANT
NDL HYPO 25X1 1.5 SAFETY (NEEDLE) IMPLANT
NDL SUT 6 .5 CRC .975X.05 MAYO (NEEDLE) ×1 IMPLANT
NEEDLE HYPO 22GX1.5 SAFETY (NEEDLE) IMPLANT
NEEDLE HYPO 25X1 1.5 SAFETY (NEEDLE) IMPLANT
NEEDLE MAYO TAPER (NEEDLE) ×4
NS IRRIG 1000ML POUR BTL (IV SOLUTION) ×4 IMPLANT
PACK BASIN DAY SURGERY FS (CUSTOM PROCEDURE TRAY) ×4 IMPLANT
PAD CAST 4YDX4 CTTN HI CHSV (CAST SUPPLIES) ×4 IMPLANT
PADDING CAST ABS COTTON 4X4 ST (CAST SUPPLIES) ×1 IMPLANT
PADDING CAST COTTON 4X4 STRL (CAST SUPPLIES) ×4
PADDING CAST COTTON 6X4 STRL (CAST SUPPLIES) ×4 IMPLANT
PENCIL SMOKE EVACUATOR (MISCELLANEOUS) ×4 IMPLANT
SANITIZER HAND PURELL 535ML FO (MISCELLANEOUS) ×4 IMPLANT
SCREW CANN PT 4X48 NS (Screw) IMPLANT
SCREW CANNULATED 4.0X48PT (Screw) ×8 IMPLANT
SCREW PEEK TENODESIS 6X12MM (Screw) ×1 IMPLANT
SHEET MEDIUM DRAPE 40X70 STRL (DRAPES) ×4 IMPLANT
SLEEVE SCD COMPRESS KNEE MED (STOCKING) ×4 IMPLANT
SPIKE FLUID TRANSFER (MISCELLANEOUS) IMPLANT
SPLINT PLASTER CAST FAST 5X30 (CAST SUPPLIES) ×80 IMPLANT
SPONGE T-LAP 18X18 ~~LOC~~+RFID (SPONGE) ×4 IMPLANT
STOCKINETTE 6  STRL (DRAPES) ×4
STOCKINETTE 6 STRL (DRAPES) ×4 IMPLANT
SUCTION FRAZIER HANDLE 10FR (MISCELLANEOUS) ×4
SUCTION TUBE FRAZIER 10FR DISP (MISCELLANEOUS) ×4 IMPLANT
SUT BONE WAX W31G (SUTURE) IMPLANT
SUT ETHIBOND 2 OS 4 DA (SUTURE) IMPLANT
SUT ETHILON 3 0 PS 1 (SUTURE) ×8 IMPLANT
SUT FIBERWIRE #2 38 T-5 BLUE (SUTURE)
SUT FIBERWIRE 2-0 18 17.9 3/8 (SUTURE)
SUT MNCRL AB 3-0 PS2 18 (SUTURE) ×4 IMPLANT
SUT MNCRL AB 4-0 PS2 18 (SUTURE) IMPLANT
SUT VIC AB 2-0 SH 18 (SUTURE) ×1 IMPLANT
SUT VIC AB 2-0 SH 27 (SUTURE)
SUT VIC AB 2-0 SH 27XBRD (SUTURE) IMPLANT
SUT VICRYL 0 SH 27 (SUTURE) ×4 IMPLANT
SUT VICRYL 0 UR6 27IN ABS (SUTURE) ×1 IMPLANT
SUTURE FIBERWR #2 38 T-5 BLUE (SUTURE) IMPLANT
SUTURE FIBERWR 2-0 18 17.9 3/8 (SUTURE) IMPLANT
SUTURE TAPE 1.3 FIBERLOP 20 ST (SUTURE) IMPLANT
SUTURETAPE 1.3 FIBERLOOP 20 ST (SUTURE)
SYR BULB EAR ULCER 3OZ GRN STR (SYRINGE) ×4 IMPLANT
SYR CONTROL 10ML LL (SYRINGE) IMPLANT
TOWEL GREEN STERILE FF (TOWEL DISPOSABLE) ×8 IMPLANT
TRAY DSU PREP LF (CUSTOM PROCEDURE TRAY) ×1 IMPLANT
TUBE CONNECTING 20X1/4 (TUBING) ×4 IMPLANT
UNDERPAD 30X36 HEAVY ABSORB (UNDERPADS AND DIAPERS) ×4 IMPLANT
YANKAUER SUCT BULB TIP NO VENT (SUCTIONS) ×1 IMPLANT

## 2022-06-20 NOTE — Anesthesia Procedure Notes (Signed)
Anesthesia Regional Block: Popliteal block   Pre-Anesthetic Checklist: , timeout performed,  Correct Patient, Correct Site, Correct Laterality,  Correct Procedure, Correct Position, site marked,  Risks and benefits discussed,  Surgical consent,  Pre-op evaluation,  At surgeon's request and post-op pain management  Laterality: Right  Prep: Dura Prep       Needles:  Injection technique: Single-shot  Needle Type: Echogenic Stimulator Needle     Needle Length: 10cm  Needle Gauge: 20     Additional Needles:   Procedures:,,,, ultrasound used (permanent image in chart),,    Narrative:  Start time: 06/20/2022 9:13 AM End time: 06/20/2022 9:15 AM Injection made incrementally with aspirations every 5 mL.  Performed by: Personally  Anesthesiologist: Darral Dash, DO  Additional Notes: Patient identified. Risks/Benefits/Options discussed with patient including but not limited to bleeding, infection, nerve damage, failed block, incomplete pain control. Patient expressed understanding and wished to proceed. All questions were answered. Sterile technique was used throughout the entire procedure. Please see nursing notes for vital signs. Aspirated in 5cc intervals with injection for negative confirmation. Patient was given instructions on fall risk and not to get out of bed. All questions and concerns addressed with instructions to call with any issues or inadequate analgesia.

## 2022-06-20 NOTE — Discharge Instructions (Addendum)
Wylene Simmer, MD [facility]  Please read the following information regarding your care after surgery.  Medications  You only need a prescription for the narcotic pain medicine (ex. oxycodone, Percocet, Norco).  All of the other medicines listed below are available over the counter. X Aleve 2 pills twice a day for the first 3 days after surgery. X acetominophen (Tylenol) 650 mg every 4-6 hours as you need for minor to moderate pain X oxycodone as prescribed for severe pain  Narcotic pain medicine (ex. oxycodone, Percocet, Vicodin) will cause constipation.  To prevent this problem, take the following medicines while you are taking any pain medicine. X docusate sodium (Colace) 100 mg twice a day X senna (Senokot) 2 tablets twice a day  X To help prevent blood clots, take Xareto as directed.  Then take a baby aspirin (81 mg) twice a day .  You should also get up every hour while you are awake to move around.    Weight Bearing  X Do not bear any weight on the operated leg or foot.  Cast / Splint / Dressing X  Keep your splint, cast or dressing clean and dry.  Don't put anything (coat hanger, pencil, etc) down inside of it.  If it gets damp, use a hair dryer on the cool setting to dry it.  If it gets soaked, call the office to schedule an appointment for a cast change.  After your dressing, cast or splint is removed; you may shower, but do not soak or scrub the wound.  Allow the water to run over it, and then gently pat it dry.  Swelling It is normal for you to have swelling where you had surgery.  To reduce swelling and pain, keep your toes above your nose for at least 3 days after surgery.  It may be necessary to keep your foot or leg elevated for several weeks.  If it hurts, it should be elevated.  Follow Up Call my office at [phone] when you are discharged from the hospital or surgery center to schedule an appointment to be seen two weeks after surgery.  Call my office at  [phone] if you develop a fever >101.5 F, nausea, vomiting, bleeding from the surgical site or severe pain.      Post Anesthesia Home Care Instructions  Activity: Get plenty of rest for the remainder of the day. A responsible individual must stay with you for 24 hours following the procedure.  For the next 24 hours, DO NOT: -Drive a car -Paediatric nurse -Drink alcoholic beverages -Take any medication unless instructed by your physician -Make any legal decisions or sign important papers.  Meals: Start with liquid foods such as gelatin or soup. Progress to regular foods as tolerated. Avoid greasy, spicy, heavy foods. If nausea and/or vomiting occur, drink only clear liquids until the nausea and/or vomiting subsides. Call your physician if vomiting continues.  Special Instructions/Symptoms: Your throat may feel dry or sore from the anesthesia or the breathing tube placed in your throat during surgery. If this causes discomfort, gargle with warm salt water. The discomfort should disappear within 24 hours.  If you had a scopolamine patch placed behind your ear for the management of post- operative nausea and/or vomiting:  1. The medication in the patch is effective for 72 hours, after which it should be removed.  Wrap patch in a tissue and discard in the trash. Wash hands thoroughly with soap and water. 2. You may remove the patch earlier than 72  hours if you experience unpleasant side effects which may include dry mouth, dizziness or visual disturbances. °3. Avoid touching the patch. Wash your hands with soap and water after contact with the patch. °   ° ° °

## 2022-06-20 NOTE — H&P (Signed)
Rebecca Terry is an 71 y.o. female.   Chief Complaint: Right foot and ankle pain HPI: 71 year old female without significant past medical history has a long history of right medial ankle and hindfoot pain due to posterior tibial tendinitis.  She has failed nonoperative treatment to date including activity modification, bracing, shoewear modification, oral anti-inflammatories and physical therapy.  She presents today for surgical treatment of this painful and limiting condition.  Past Medical History:  Diagnosis Date   Abnormal Pap smear of cervix    09/2013 Ascus:Pos HR HPV, 03/2014 Ascus:Pos HR HPV;colpo 05/2014 LGSIL of cx and VIN 1of perianal region, 08-09-16 neg HPV HR +   Aortic valve disorders    Arthritis    Diverticulosis    Elevated bilirubin 08/09/2016   level 1.4   Essential hypertension, benign    Fibroid    posterior   Herpes genitalis in women    History of MRSA infection 07/2015   culture positive [facility]   Hyperlipidemia    Hypertension    Osteopenia 2018   hip and spine   Primary localized osteoarthritis of left knee    Rectocele    Sleep apnea    Urinary incontinence     Past Surgical History:  Procedure Laterality Date   arthroscopic knee Left 2001   BILATERAL OOPHORECTOMY  2010   Dr. Quincy Simmonds   CARPAL TUNNEL RELEASE Bilateral    COLONOSCOPY     DIAGNOSTIC LAPAROSCOPY     PELVIC LAPAROSCOPY     x2--ectopics   TOTAL KNEE ARTHROPLASTY Left 12/11/2015   Procedure: LEFT TOTAL KNEE ARTHROPLASTY;  Surgeon: Elsie Saas, MD;  Location: [place];  Service: Orthopedics;  Laterality: Left;    Family History  Problem Relation Age of Onset   Asthma Mother    Congestive Heart Failure Father    Multiple sclerosis Father    Heart attack Father    Social History:  reports that she quit smoking about 29 years ago. Her smoking use included cigarettes. She has a 10.00 pack-year smoking history. She has never used smokeless tobacco. She reports current alcohol use  of about 5.0 standard drinks of alcohol per week. She reports that she does not use drugs.  Allergies:  Allergies  Allergen Reactions   Hibiclens [Chlorhexidine Gluconate] Itching and Rash    Contact dermatitis   Lisinopril Hives and Other (See Comments)   Latex Rash and Other (See Comments)   Nickel Itching and Rash   Penicillins Rash    Has patient had a PCN reaction causing immediate rash, facial/tongue/throat swelling, SOB or lightheadedness with hypotension: No Has patient had a PCN reaction causing severe rash involving mucus membranes or skin necrosis: No Has patient had a PCN reaction that required hospitalization No Has patient had a PCN reaction occurring within the last 10 years: No If all of the above answers are "NO", then may proceed with Cephalosporin use.     Medications Prior to Admission  Medication Sig Dispense Refill   aspirin EC 81 MG tablet Take 81 mg by mouth daily. Swallow whole.     clobetasol ointment (TEMOVATE) 0.05 % Apply externally Twice a day for 14 days 30 g 0   estradiol (ESTRACE) 0.1 MG/GM vaginal cream PLACE 1/2 GRAM VAGINALLY 2 - 3 TIMES WEEKLY AS DIRECTED 42.5 g 1   losartan-hydrochlorothiazide (HYZAAR) 100-25 MG tablet Take 1 tablet by mouth once a day 90 tablet 1   Magnesium 400 MG TABS Take 1 tablet by mouth daily.  metoprolol succinate (TOPROL-XL) 25 MG 24 hr tablet Take 1 tablet by mouth daily. 90 tablet 1   Multiple Vitamin (MULTIVITAMIN) tablet Take 1 tablet by mouth daily.     rosuvastatin (CRESTOR) 10 MG tablet Take 1 tablet (10 mg total) by mouth daily. 60 tablet 2   TURMERIC PO Take 1 tablet by mouth daily.     zolpidem (AMBIEN) 5 MG tablet Take 1 to 2 tablets by mouth at bedtime as needed for insomnia 15 tablet 0    No results found for this or any previous visit (from the past 48 hour(s)). No results found.  Review of Systems no recent fever, chills, nausea, vomiting or changes in her appetite  Blood pressure (!) 111/100,  pulse 60, temperature 98.7 F (37.1 C), temperature source Oral, resp. rate 11, height '5\' 4"'$  (1.626 m), weight 81.7 kg, last menstrual period 10/21/1992, SpO2 97 %. Physical Exam  Well-nourished well-developed woman in no apparent distress.  Alert and oriented x4.  Normal mood and affect.  Extraocular motions are intact.  Respirations are unlabored.  Gait is normal.  Tender to palpation along the posterior tibial tendon.  Unable to do a single heel rise.  5 out of 5 strength in plantarflexion and inversion.  Heel cord is tight.  Skin is healthy and intact.  Pulses are palpable in the foot.  No lymphadenopathy.   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  The risks and benefits of the alternative treatment options have been discussed in detail.  The patient wishes to proceed with surgery and specifically understands risks of bleeding, infection, nerve damage, blood clots, need for additional surgery, amputation and death.   Wylene Simmer, MD 06-28-22, 9:30 AM

## 2022-06-20 NOTE — Anesthesia Procedure Notes (Signed)
Procedure Name: LMA Insertion Date/Time: 06/20/2022 10:03 AM  Performed by: Jerusalem Wert, Ernesta Amble, CRNAPre-anesthesia Checklist: Patient identified, Emergency Drugs available, Suction available and Patient being monitored Patient Re-evaluated:Patient Re-evaluated prior to induction Oxygen Delivery Method: Circle system utilized Preoxygenation: Pre-oxygenation with 100% oxygen Induction Type: IV induction Ventilation: Mask ventilation without difficulty LMA: LMA inserted LMA Size: 4.0 Number of attempts: 1 Airway Equipment and Method: Bite block Placement Confirmation: positive ETCO2 Tube secured with: Tape Dental Injury: Teeth and Oropharynx as per pre-operative assessment

## 2022-06-20 NOTE — Anesthesia Preprocedure Evaluation (Signed)
Anesthesia Evaluation  Patient identified by MRN, date of birth, ID band Patient awake    Reviewed: Allergy & Precautions, NPO status , Patient's Chart, lab work & pertinent test results  Airway Mallampati: II  TM Distance: >3 FB Neck ROM: Full    Dental no notable dental hx.    Pulmonary sleep apnea , former smoker,    Pulmonary exam normal        Cardiovascular hypertension, Pt. on medications and Pt. on home beta blockers  Rhythm:Regular Rate:Normal     Neuro/Psych negative neurological ROS  negative psych ROS   GI/Hepatic negative GI ROS, Neg liver ROS,   Endo/Other  negative endocrine ROS  Renal/GU negative Renal ROS  negative genitourinary   Musculoskeletal  (+) Arthritis , Osteoarthritis,    Abdominal Normal abdominal exam  (+)   Peds  Hematology negative hematology ROS (+)   Anesthesia Other Findings   Reproductive/Obstetrics                            Anesthesia Physical Anesthesia Plan  ASA: 2  Anesthesia Plan: General and Regional   Post-op Pain Management: Regional block*   Induction: Intravenous  PONV Risk Score and Plan: 3 and Ondansetron, Dexamethasone, Midazolam and Treatment may vary due to age or medical condition  Airway Management Planned: Mask and LMA  Additional Equipment: None  Intra-op Plan:   Post-operative Plan: Extubation in OR  Informed Consent: I have reviewed the patients History and Physical, chart, labs and discussed the procedure including the risks, benefits and alternatives for the proposed anesthesia with the patient or authorized representative who has indicated his/her understanding and acceptance.     Dental advisory given  Plan Discussed with: CRNA  Anesthesia Plan Comments: ( )        Anesthesia Quick Evaluation

## 2022-06-20 NOTE — Anesthesia Procedure Notes (Signed)
Anesthesia Regional Block: Adductor canal block   Pre-Anesthetic Checklist: , timeout performed,  Correct Patient, Correct Site, Correct Laterality,  Correct Procedure, Correct Position, site marked,  Risks and benefits discussed,  Surgical consent,  Pre-op evaluation,  At surgeon's request and post-op pain management  Laterality: Right  Prep: Dura Prep       Needles:  Injection technique: Single-shot  Needle Type: Echogenic Stimulator Needle     Needle Length: 10cm  Needle Gauge: 20     Additional Needles:   Procedures:,,,, ultrasound used (permanent image in chart),,    Narrative:  Start time: 06/20/2022 9:08 AM End time: 06/20/2022 9:11 AM Injection made incrementally with aspirations every 5 mL.  Performed by: Personally  Anesthesiologist: Darral Dash, DO  Additional Notes: Patient identified. Risks/Benefits/Options discussed with patient including but not limited to bleeding, infection, nerve damage, failed block, incomplete pain control. Patient expressed understanding and wished to proceed. All questions were answered. Sterile technique was used throughout the entire procedure. Please see nursing notes for vital signs. Aspirated in 5cc intervals with injection for negative confirmation. Patient was given instructions on fall risk and not to get out of bed. All questions and concerns addressed with instructions to call with any issues or inadequate analgesia.

## 2022-06-20 NOTE — Op Note (Signed)
06/20/2022  11:23 AM  PATIENT:  Rebecca Terry  71 y.o. female  PRE-OPERATIVE DIAGNOSIS:   1.  Right posterior tibial tendonitis (PTTD / PCFD)      2.  Short right achilles tendon  POST-OPERATIVE DIAGNOSIS:   1.  Right posterior tibial tendonitis (PTTD / PCFD)      2.  Short right achilles tendon      3.  Right spring ligament (deltoid) tear  Procedure(s):  Right gastronemius recession (separate incision) Right calcaneus osteotomy (separate incision) Right posterior tibial tendon repair Deep transfer of right flexor digitorum longus tendon to the navicular Repair of spring portion of the deltoid ligament AP, lateral and Harris heel xrays  SURGEON:  Wylene Simmer, MD  ASSISTANT: none  ANESTHESIA:   General, regional  EBL:  minimal   TOURNIQUET:   Total Tourniquet Time Documented: Thigh (Right) - 58 minutes Total: Thigh (Right) - 58 minutes  COMPLICATIONS:  None apparent  DISPOSITION:  Extubated, awake and stable to recovery.  INDICATION FOR PROCEDURE:  71 y/o female with a long h/o right ankle and hindfoot pain due to a tight heelcord and posterior tibial tendonitis.  She has failed non op treatment and presents today for surgical treatment of these painful and limiting conditions.  The risks and benefits of the alternative treatment options have been discussed in detail.  The patient wishes to proceed with surgery and specifically understands risks of bleeding, infection, nerve damage, blood clots, need for additional surgery, amputation and death.   PROCEDURE IN DETAIL:  After pre operative consent was obtained, and the correct operative site was identified, the patient was brought to the operating room and placed supine on the OR table.  Anesthesia was administered.  Pre-operative antibiotics were administered.  A surgical timeout was taken. The right lower extremity was prepped and draped in sterile fashion with a tourniquet around the thigh.  The extremity was elevated  and the tourniquet inflated to 250 mm Hg.  An incision was made over the medial calf.  Dissection was carried down through the subcutaneous tissues.  The gastroc fascia was incised.  The plantaris tendon and gastroc tendon were divided from medial to lateral under direct vision taking care to protect the sural nerve.  The ankle was then dorsiflexed to 30 degrees with the knee extended.  The wound was irrigated and sprinkled with vancomycin powder.  Subcutaneous tissues were approximated with Monocryl.  Skin incision was closed with nylon.  Attention was turned to the lateral heel.  An oblique incision was made over the lateral wall of the calcaneus.  Dissection was carried sharply down through the subcutaneous tissues.  The periosteum was incised and elevated.  A K wire was placed to mark the osteotomy.  Radiograph confirmed appropriate position of the K wire.  An osteotomy was then made across the isthmus of the calcaneus protecting the surrounding soft tissues.  The osteotomy was mobilized with an osteotome and a lamina spreader.  The tuberosity was translated medially.  The osteotomy was fixed with 2 4 mm partially-threaded cannulated screws from the [employer] 4050 cannulated screw set.  A Harris heel radiographs confirmed appropriate medial translation of the calcaneus and appropriate position and length of both screws.  Both wounds were then irrigated and closed with nylon after smoothing the cut bone of the lateral calcaneus.  Attention was turned to the medial hindfoot.  An incision was made along the course of the posterior tibial tendon.  Dissection was carried sharply down  through the subcutaneous tissues.  The posterior tibial tendon sheath was incised.  The tendon was examined.  There was significant synovitis as well as a longitudinal tear with some degenerative changes.  The tendon was debrided of all degenerated fibers and synovitis.  A longitudinal tear in the tendon was repaired with a  running suture of 0 Vicryl.  The spring ligament portion of the deltoid was then examined.  There was a tear noted that was full-thickness extending into the talonavicular joint.  The edges of the tear were freshened with a rondure.  A 0 Vicryl imbricating suture was placed in the tear approximating the edges appropriately.  The sutures were tagged for later tying.  The FDL tendon was then identified proximally.  It was dissected distally to the knot of [name].  It was transected and the and whipstitched with 0 Vicryl.  A guidewire was then placed in the navicular tuberosity.  Radiographs confirmed appropriate position of the guidewire.  The guidewire was then overdrilled with a 5 mm reamer.  The tendon was pulled up through the hole and tensioned appropriately.  A 6 mm [employer] bolt was inserted and was noted to have appropriate purchase.  The suture ends were then passed through the adjacent periosteum and tied.  The previously placed spring ligament sutures were then tied completing the spring ligament repair.  Final AP, lateral and Harris heel radiographs confirmed appropriate position and length of all hardware and appropriate correction of the arch deformity.  The wound was irrigated copiously and sprinkled with vancomycin powder.  Subcutaneous tissues and tendon sheath were repaired with 0 Vicryl.  Skin incision was closed with 3-0 nylon.  Sterile dressings were applied followed by a well-padded short leg splint.  The tourniquet was released after application of the dressings.  The patient was awakened from anesthesia and transported to the recovery room in stable condition.  FOLLOW UP PLAN: Nonweightbearing on the right lower extremity.  Xarelto for DVT prophylaxis for 2 weeks.  Follow-up in the office in 2 weeks for suture removal and conversion to a short leg cast.   RADIOGRAPHS: AP, lateral and Harris heel radiographs of the right foot are obtained intraoperatively.  These show interval  medialization of the calcaneus tuberosity.  Screws are appropriately positioned and of the appropriate length.  The navicular is noted to cover the head of the talus appropriately.  No other acute injuries are noted.

## 2022-06-20 NOTE — Transfer of Care (Signed)
Immediate Anesthesia Transfer of Care Note  Patient: Rebecca Terry  Procedure(s) Performed: CALCANEAL OSTEOTOMY (Right: Foot) GASTROCNEMIUS RECESSION (Right: Leg Lower) POSTERIOR TIBIAL TENDON TENOLYSIS AND FLEXOR DIGITORUM LONGUS TRANSFER TO NAVICULAR (Right: Ankle)  Patient Location: PACU  Anesthesia Type:GA combined with regional for post-op pain  Level of Consciousness: drowsy and patient cooperative  Airway & Oxygen Therapy: Patient Spontanous Breathing and Patient connected to face mask oxygen  Post-op Assessment: Report given to RN and Post -op Vital signs reviewed and stable  Post vital signs: Reviewed and stable  Last Vitals:  Vitals Value Taken Time  BP    Temp    Pulse 69 06/20/22 1125  Resp 14 06/20/22 1125  SpO2 94 % 06/20/22 1125  Vitals shown include unvalidated device data.  Last Pain:  Vitals:   06/20/22 0812  TempSrc: Oral  PainSc: 0-No pain         Complications: No notable events documented.

## 2022-06-20 NOTE — Anesthesia Postprocedure Evaluation (Signed)
Anesthesia Post Note  Patient: Rebecca Terry  Procedure(s) Performed: CALCANEAL OSTEOTOMY (Right: Foot) GASTROCNEMIUS RECESSION (Right: Leg Lower) POSTERIOR TIBIAL TENDON TENOLYSIS AND FLEXOR DIGITORUM LONGUS TRANSFER TO NAVICULAR (Right: Ankle)     Patient location during evaluation: PACU Anesthesia Type: Regional and General Level of consciousness: awake and alert Pain management: pain level controlled Vital Signs Assessment: post-procedure vital signs reviewed and stable Respiratory status: spontaneous breathing, nonlabored ventilation, respiratory function stable and patient connected to nasal cannula oxygen Cardiovascular status: blood pressure returned to baseline and stable Postop Assessment: no apparent nausea or vomiting Anesthetic complications: no   No notable events documented.  Last Vitals:  Vitals:   06/20/22 1154 06/20/22 1200  BP:  108/62  Pulse:  74  Resp: 18 18  Temp:    SpO2: 94% 96%    Last Pain:  Vitals:   06/20/22 0812  TempSrc: Oral  PainSc: 0-No pain                 March Rummage Beverlee Wilmarth

## 2022-06-24 ENCOUNTER — Encounter (HOSPITAL_BASED_OUTPATIENT_CLINIC_OR_DEPARTMENT_OTHER): Payer: Self-pay | Admitting: Orthopedic Surgery

## 2022-07-05 DIAGNOSIS — Z4889 Encounter for other specified surgical aftercare: Secondary | ICD-10-CM | POA: Diagnosis not present

## 2022-07-05 DIAGNOSIS — M67961 Unspecified disorder of synovium and tendon, right lower leg: Secondary | ICD-10-CM | POA: Diagnosis not present

## 2022-07-15 ENCOUNTER — Other Ambulatory Visit: Payer: Self-pay | Admitting: Family Medicine

## 2022-07-15 DIAGNOSIS — Z1231 Encounter for screening mammogram for malignant neoplasm of breast: Secondary | ICD-10-CM

## 2022-07-25 ENCOUNTER — Other Ambulatory Visit (HOSPITAL_COMMUNITY): Payer: Self-pay

## 2022-08-02 DIAGNOSIS — M79671 Pain in right foot: Secondary | ICD-10-CM | POA: Diagnosis not present

## 2022-08-02 DIAGNOSIS — Z4789 Encounter for other orthopedic aftercare: Secondary | ICD-10-CM | POA: Diagnosis not present

## 2022-08-07 ENCOUNTER — Other Ambulatory Visit (HOSPITAL_COMMUNITY): Payer: Self-pay

## 2022-08-08 ENCOUNTER — Other Ambulatory Visit (HOSPITAL_COMMUNITY): Payer: Self-pay

## 2022-08-08 MED ORDER — METOPROLOL SUCCINATE ER 25 MG PO TB24
25.0000 mg | ORAL_TABLET | Freq: Every day | ORAL | 0 refills | Status: DC
  Filled 2022-08-08: qty 90, 90d supply, fill #0

## 2022-08-09 ENCOUNTER — Other Ambulatory Visit (HOSPITAL_COMMUNITY): Payer: Self-pay

## 2022-08-12 DIAGNOSIS — H2513 Age-related nuclear cataract, bilateral: Secondary | ICD-10-CM | POA: Diagnosis not present

## 2022-08-12 DIAGNOSIS — H524 Presbyopia: Secondary | ICD-10-CM | POA: Diagnosis not present

## 2022-08-29 ENCOUNTER — Other Ambulatory Visit: Payer: Self-pay | Admitting: Obstetrics and Gynecology

## 2022-08-29 DIAGNOSIS — N952 Postmenopausal atrophic vaginitis: Secondary | ICD-10-CM

## 2022-08-30 NOTE — Telephone Encounter (Signed)
Last exam 10/2021 No exam scheduled  Mammogram scheduled on 09/05/22

## 2022-08-31 MED ORDER — ESTRADIOL 0.1 MG/GM VA CREA
0.5000 g | TOPICAL_CREAM | VAGINAL | 0 refills | Status: DC
  Filled 2022-08-31: qty 42.5, 90d supply, fill #0

## 2022-09-02 ENCOUNTER — Other Ambulatory Visit (HOSPITAL_COMMUNITY): Payer: Self-pay

## 2022-09-05 ENCOUNTER — Ambulatory Visit: Payer: PPO

## 2022-09-06 DIAGNOSIS — Z4789 Encounter for other orthopedic aftercare: Secondary | ICD-10-CM | POA: Diagnosis not present

## 2022-09-06 DIAGNOSIS — M79671 Pain in right foot: Secondary | ICD-10-CM | POA: Diagnosis not present

## 2022-09-08 DIAGNOSIS — G4733 Obstructive sleep apnea (adult) (pediatric): Secondary | ICD-10-CM | POA: Diagnosis not present

## 2022-09-16 ENCOUNTER — Other Ambulatory Visit (HOSPITAL_COMMUNITY): Payer: Self-pay

## 2022-09-20 ENCOUNTER — Ambulatory Visit
Admission: RE | Admit: 2022-09-20 | Discharge: 2022-09-20 | Disposition: A | Discharge status: Home / Self Care | Payer: PPO | Source: Ambulatory Visit | Attending: Family Medicine | Admitting: Family Medicine

## 2022-09-20 DIAGNOSIS — Z1231 Encounter for screening mammogram for malignant neoplasm of breast: Secondary | ICD-10-CM

## 2022-09-23 DIAGNOSIS — M6281 Muscle weakness (generalized): Secondary | ICD-10-CM | POA: Diagnosis not present

## 2022-09-23 DIAGNOSIS — Z4789 Encounter for other orthopedic aftercare: Secondary | ICD-10-CM | POA: Diagnosis not present

## 2022-10-02 DIAGNOSIS — M67961 Unspecified disorder of synovium and tendon, right lower leg: Secondary | ICD-10-CM | POA: Diagnosis not present

## 2022-10-16 ENCOUNTER — Other Ambulatory Visit (HOSPITAL_COMMUNITY): Payer: Self-pay

## 2022-10-16 MED ORDER — ZOLPIDEM TARTRATE 5 MG PO TABS
5.0000 mg | ORAL_TABLET | Freq: Every day | ORAL | 0 refills | Status: DC
  Filled 2022-10-16: qty 15, 15d supply, fill #0

## 2022-10-16 MED ORDER — LOSARTAN POTASSIUM-HCTZ 100-25 MG PO TABS
1.0000 | ORAL_TABLET | Freq: Every day | ORAL | 1 refills | Status: DC
  Filled 2022-10-16: qty 90, 90d supply, fill #0

## 2022-10-30 DIAGNOSIS — M67961 Unspecified disorder of synovium and tendon, right lower leg: Secondary | ICD-10-CM | POA: Diagnosis not present

## 2022-10-31 DIAGNOSIS — E785 Hyperlipidemia, unspecified: Secondary | ICD-10-CM | POA: Diagnosis not present

## 2022-10-31 DIAGNOSIS — I10 Essential (primary) hypertension: Secondary | ICD-10-CM | POA: Diagnosis not present

## 2022-10-31 DIAGNOSIS — G4733 Obstructive sleep apnea (adult) (pediatric): Secondary | ICD-10-CM | POA: Diagnosis not present

## 2022-11-04 ENCOUNTER — Other Ambulatory Visit (HOSPITAL_COMMUNITY): Payer: Self-pay

## 2022-11-04 MED ORDER — METOPROLOL SUCCINATE ER 25 MG PO TB24
25.0000 mg | ORAL_TABLET | Freq: Every day | ORAL | 0 refills | Status: DC
Start: 2022-11-04 — End: 2023-01-08
  Filled 2022-11-04: qty 90, 90d supply, fill #0

## 2022-12-08 DIAGNOSIS — G4733 Obstructive sleep apnea (adult) (pediatric): Secondary | ICD-10-CM | POA: Diagnosis not present

## 2023-01-06 ENCOUNTER — Other Ambulatory Visit (HOSPITAL_COMMUNITY): Payer: Self-pay

## 2023-01-06 DIAGNOSIS — D485 Neoplasm of uncertain behavior of skin: Secondary | ICD-10-CM | POA: Diagnosis not present

## 2023-01-06 DIAGNOSIS — L570 Actinic keratosis: Secondary | ICD-10-CM | POA: Diagnosis not present

## 2023-01-06 DIAGNOSIS — L820 Inflamed seborrheic keratosis: Secondary | ICD-10-CM | POA: Diagnosis not present

## 2023-01-06 DIAGNOSIS — D2271 Melanocytic nevi of right lower limb, including hip: Secondary | ICD-10-CM | POA: Diagnosis not present

## 2023-01-06 DIAGNOSIS — L578 Other skin changes due to chronic exposure to nonionizing radiation: Secondary | ICD-10-CM | POA: Diagnosis not present

## 2023-01-06 DIAGNOSIS — L814 Other melanin hyperpigmentation: Secondary | ICD-10-CM | POA: Diagnosis not present

## 2023-01-06 DIAGNOSIS — D225 Melanocytic nevi of trunk: Secondary | ICD-10-CM | POA: Diagnosis not present

## 2023-01-06 DIAGNOSIS — L821 Other seborrheic keratosis: Secondary | ICD-10-CM | POA: Diagnosis not present

## 2023-01-08 ENCOUNTER — Other Ambulatory Visit (HOSPITAL_COMMUNITY): Payer: Self-pay

## 2023-01-08 DIAGNOSIS — I351 Nonrheumatic aortic (valve) insufficiency: Secondary | ICD-10-CM | POA: Diagnosis not present

## 2023-01-08 DIAGNOSIS — E785 Hyperlipidemia, unspecified: Secondary | ICD-10-CM | POA: Diagnosis not present

## 2023-01-08 DIAGNOSIS — R7303 Prediabetes: Secondary | ICD-10-CM | POA: Diagnosis not present

## 2023-01-08 DIAGNOSIS — I700 Atherosclerosis of aorta: Secondary | ICD-10-CM | POA: Diagnosis not present

## 2023-01-08 DIAGNOSIS — I10 Essential (primary) hypertension: Secondary | ICD-10-CM | POA: Diagnosis not present

## 2023-01-08 DIAGNOSIS — G4700 Insomnia, unspecified: Secondary | ICD-10-CM | POA: Diagnosis not present

## 2023-01-08 DIAGNOSIS — Z1331 Encounter for screening for depression: Secondary | ICD-10-CM | POA: Diagnosis not present

## 2023-01-08 MED ORDER — ZOLPIDEM TARTRATE 5 MG PO TABS
5.0000 mg | ORAL_TABLET | Freq: Every evening | ORAL | 0 refills | Status: DC | PRN
  Filled 2023-01-08: qty 15, 7d supply, fill #0

## 2023-01-08 MED ORDER — LOSARTAN POTASSIUM-HCTZ 100-25 MG PO TABS
1.0000 | ORAL_TABLET | Freq: Every day | ORAL | 0 refills | Status: DC
Start: 2023-01-08 — End: 2023-03-21
  Filled 2023-01-08: qty 90, 90d supply, fill #0

## 2023-01-08 MED ORDER — METOPROLOL SUCCINATE ER 25 MG PO TB24
25.0000 mg | ORAL_TABLET | Freq: Every day | ORAL | 1 refills | Status: DC
  Filled 2023-01-08 – 2023-01-14 (×2): qty 90, 90d supply, fill #0
  Filled 2023-04-29: qty 90, 90d supply, fill #1

## 2023-01-09 ENCOUNTER — Other Ambulatory Visit (HOSPITAL_COMMUNITY): Payer: Self-pay

## 2023-01-14 ENCOUNTER — Other Ambulatory Visit (HOSPITAL_BASED_OUTPATIENT_CLINIC_OR_DEPARTMENT_OTHER): Payer: Self-pay

## 2023-01-14 ENCOUNTER — Other Ambulatory Visit (HOSPITAL_COMMUNITY): Payer: Self-pay

## 2023-01-20 ENCOUNTER — Other Ambulatory Visit (HOSPITAL_COMMUNITY): Payer: Self-pay

## 2023-01-28 NOTE — Progress Notes (Signed)
72 y.o. Rebecca Terry Single Caucasian female here for annual exam.    Wants refill of her estradiol cream.    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   Declines STD screening.   Doing weight loss.   Had right ankle surgery following a foot fracture.   Retired.   PCP:   Dr. Wynelle Link  Patient's last menstrual period was 10/21/1992.           Sexually active: No.  The current method of family planning is post menopausal status.    Exercising: Yes.     Going to [employer], walking Smoker:  former  Health Maintenance: Pap:  10/31/21 ASCUS: HR HPV positive, 10/09/20 ASCUS: HR HPV positive History of abnormal Pap:  yes,  10-31-20 colpo revealed ECC LSIL, perineal bx verruca vulgaris. 10-09-20 ASCUS:Pos HR HPV, 01-05-18 Neg:Pos HR HPV, 01-19-18 colpo cx bx mild HPV, CIN I; vulvar Bx neg.  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 06/08/15 Pap smear Exocervix bx - LGSIL, ECC negative; 05/08/15 Neg:Pos HR HPV; Pap smear 05-08-15 normal:Pos HR HPV--colposcopy revealing LGSIL of cervix and VIN I of perianal region  MMG:  09/20/22 Breast Density Cat C, BI-RADS CAT 1 neg Colonoscopy:  2022 normal, next 5 years BMD:   12/18/21  Result  normal TDaP:  03/2013  Gardasil:   no HIV: 08/09/16 NR Hep C: 08/09/16 neg Screening Labs:  PCP   reports that she quit smoking about 29 years ago. Her smoking use included cigarettes. She has a 10.00 pack-year smoking history. She has never used smokeless tobacco. She reports current alcohol use of about 5.0 standard drinks of alcohol per week. She reports that she does not use drugs.  Past Medical History:  Diagnosis Date   Abnormal Pap smear of cervix    09/2013 Ascus:Pos HR HPV, 03/2014 Ascus:Pos HR HPV;colpo 05/2014 LGSIL of cx and VIN 1of perianal region, 08-09-16 neg HPV HR +   Aortic valve disorders    Arthritis    Diverticulosis    Elevated  bilirubin 08/09/2016   level 1.4   Essential hypertension, benign    Fibroid    posterior   Herpes genitalis in women    History of MRSA infection 07/2015   culture positive [facility]   Hyperlipidemia    Hypertension    Osteopenia 2018   hip and spine   Primary localized osteoarthritis of left knee    Rectocele    Sleep apnea    Urinary incontinence     Past Surgical History:  Procedure Laterality Date   arthroscopic knee Left 2001   BILATERAL OOPHORECTOMY  2010   Dr. Rutha Bouchard OSTEOTOMY Right 06/20/2022   Procedure: CALCANEAL OSTEOTOMY;  Surgeon: Toni Arthurs, MD;  Location: [facility];  Service: Orthopedics;  Laterality: Right;   CARPAL TUNNEL RELEASE Bilateral    COLONOSCOPY     DIAGNOSTIC LAPAROSCOPY     GASTROCNEMIUS RECESSION Right 06/20/2022   Procedure: GASTROCNEMIUS RECESSION;  Surgeon: Toni Arthurs, MD;  Location: [facility];  Service: Orthopedics;  Laterality: Right;   PELVIC LAPAROSCOPY     x2--ectopics   TOTAL KNEE ARTHROPLASTY Left 12/11/2015   Procedure: LEFT TOTAL KNEE ARTHROPLASTY;  Surgeon: Salvatore Marvel, MD;  Location: [facility] OR;  Service: Orthopedics;  Laterality: Left;    Current Outpatient Medications  Medication Sig Dispense Refill   clobetasol  ointment (TEMOVATE) 0.05 % Apply externally Twice a day for 14 days 30 g 0   estradiol (ESTRACE) 0.1 MG/GM vaginal cream Place 0.5 g vaginally 2-3 times a week as directed 42.5 g 0   losartan-hydrochlorothiazide (HYZAAR) 100-25 MG tablet Take 1 tablet by mouth daily. 90 tablet 0   Magnesium 400 MG TABS Take 1 tablet by mouth daily.     metoprolol succinate (TOPROL-XL) 25 MG 24 hr tablet Take 1 tablet (25 mg total) by mouth daily. 90 tablet 1   Multiple Vitamin (MULTIVITAMIN) tablet Take 1 tablet by mouth daily.     rosuvastatin (CRESTOR) 10 MG tablet Take 1 tablet (10 mg total) by mouth daily. 60 tablet 2   TURMERIC PO Take 1 tablet by mouth daily.     zolpidem (AMBIEN) 5  MG tablet Take 1 - 2 tablets (5 - 10 mg total) by mouth at bedtime as needed for insomnia. 15 tablet 0   No current facility-administered medications for this visit.    Family History  Problem Relation Age of Onset   Asthma Mother    Congestive Heart Failure Father    Multiple sclerosis Father    Heart attack Father     Review of Systems  All other systems reviewed and are negative.   Exam:   BP 128/74 (BP Location: Left Arm, Patient Position: Sitting, Cuff Size: Large)   Pulse 71   Ht 5\' 3"  (1.6 m)   Wt 171 lb (77.6 kg)   LMP 10/21/1992   SpO2 97%   BMI 30.29 kg/m     General appearance: alert, cooperative and appears stated age Head: normocephalic, without obvious abnormality, atraumatic Neck: no adenopathy, supple, symmetrical, trachea midline and thyroid normal to inspection and palpation Lungs: clear to auscultation bilaterally Breasts: normal appearance, no masses or tenderness, No nipple retraction or dimpling, No nipple discharge or bleeding, No axillary adenopathy Heart: regular rate and rhythm Abdomen: soft, non-tender; no masses, no organomegaly Extremities: extremities normal, atraumatic, no cyanosis or edema Skin: skin color, texture, turgor normal. No rashes or lesions Lymph nodes: cervical, supraclavicular, and axillary nodes normal. Neurologic: grossly normal  Pelvic: External genitalia:  no lesions              No abnormal inguinal nodes palpated.              Urethra:  normal appearing urethra with no masses, tenderness or lesions              Bartholins and Skenes: normal                 Vagina: normal appearing vagina with normal color and discharge, no lesions.  Atrophy noted.               Cervix: no lesions.  Friable cervix.                Pap taken: yes Bimanual Exam:  Uterus:  normal size, contour, position, consistency, mobility, non-tender              Adnexa: no mass, fullness, tenderness              Rectal exam: yes.  Confirms.               Anus:  normal sphincter tone, no lesions  Chaperone was present for exam:  Warren Lacy, CMA  Assessment:   Well woman visit with gynecologic exam. High risk Medicare GYN exam. 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  Hx HSV. Hx minimal stress incontinence and rectocele.  No significant prolapse noted today.  Vaginal atrophy.  Osteopenia. Aortic insufficiency.  HTN.   Plan: Mammogram screening discussed. Self breast awareness reviewed. Pap and HR HPV collected.  Guidelines for Calcium, Vitamin D, regular exercise program including cardiovascular and weight bearing exercise. TDap.   Refill of Estradiol cream 1/2 gram three times weekly.   I reviewed potential effect on breast cancer.  Hysterectomy not recommended based on positive HR HPV and LGSIL of cervix.  Possible continued positive HR HPV and colposcopy of the vagina could be needed after hysterectomy.  If patient has surgery for rectocele or stress incontinence in the future, hysterectomy could be considered at that time.  Follow up annually and prn.   After visit summary provided.   25 min  total time was spent for this patient encounter, including preparation, face-to-face counseling with the patient, coordination of care, and documentation of the encounter in addition to performing the breast and pelvic exam.

## 2023-02-11 ENCOUNTER — Other Ambulatory Visit (HOSPITAL_COMMUNITY)
Admission: RE | Admit: 2023-02-11 | Discharge: 2023-02-11 | Disposition: A | Discharge status: Home / Self Care | Payer: PPO | Source: Ambulatory Visit | Attending: Obstetrics and Gynecology | Admitting: Obstetrics and Gynecology

## 2023-02-11 ENCOUNTER — Encounter: Payer: Self-pay | Admitting: Obstetrics and Gynecology

## 2023-02-11 ENCOUNTER — Other Ambulatory Visit (HOSPITAL_COMMUNITY): Payer: Self-pay

## 2023-02-11 ENCOUNTER — Ambulatory Visit (INDEPENDENT_AMBULATORY_CARE_PROVIDER_SITE_OTHER): Payer: PPO | Admitting: Obstetrics and Gynecology

## 2023-02-11 VITALS — BP 128/74 | HR 71 | Ht 63.0 in | Wt 171.0 lb

## 2023-02-11 DIAGNOSIS — Z9189 Other specified personal risk factors, not elsewhere classified: Secondary | ICD-10-CM

## 2023-02-11 DIAGNOSIS — Z8619 Personal history of other infectious and parasitic diseases: Secondary | ICD-10-CM | POA: Diagnosis not present

## 2023-02-11 DIAGNOSIS — Z23 Encounter for immunization: Secondary | ICD-10-CM | POA: Diagnosis not present

## 2023-02-11 DIAGNOSIS — Z1151 Encounter for screening for human papillomavirus (HPV): Secondary | ICD-10-CM | POA: Diagnosis not present

## 2023-02-11 DIAGNOSIS — N952 Postmenopausal atrophic vaginitis: Secondary | ICD-10-CM | POA: Diagnosis not present

## 2023-02-11 DIAGNOSIS — Z124 Encounter for screening for malignant neoplasm of cervix: Secondary | ICD-10-CM | POA: Insufficient documentation

## 2023-02-11 MED ORDER — ESTRADIOL 0.1 MG/GM VA CREA
0.5000 g | TOPICAL_CREAM | VAGINAL | 1 refills | Status: DC
  Filled 2023-02-11: qty 42.5, 90d supply, fill #0
  Filled 2023-08-15: qty 42.5, 90d supply, fill #1

## 2023-02-11 NOTE — Patient Instructions (Signed)

## 2023-02-13 DIAGNOSIS — L570 Actinic keratosis: Secondary | ICD-10-CM | POA: Diagnosis not present

## 2023-02-13 DIAGNOSIS — L820 Inflamed seborrheic keratosis: Secondary | ICD-10-CM | POA: Diagnosis not present

## 2023-02-18 ENCOUNTER — Telehealth: Payer: Self-pay

## 2023-02-18 LAB — CYTOLOGY - PAP
Comment: NEGATIVE
Diagnosis: NEGATIVE
Diagnosis: REACTIVE
High risk HPV: POSITIVE — AB

## 2023-02-18 NOTE — Telephone Encounter (Signed)
Pap and HR HPV results finalized today.   I sent the patient a result note with recommendation for next cervical cancer screening in 1 year.

## 2023-02-18 NOTE — Telephone Encounter (Signed)
Pt LVM in triage line inquiring about recent pap test results. Stated she doesn't recall ever having to wait this long for a result note/call. Please review/advise at your earliest convenience. Thanks.

## 2023-02-26 NOTE — Telephone Encounter (Signed)
Result note read by pt and pap recall placed by KA. Will close encounter.

## 2023-02-27 ENCOUNTER — Ambulatory Visit: Payer: PPO | Admitting: Nurse Practitioner

## 2023-03-10 DIAGNOSIS — G4733 Obstructive sleep apnea (adult) (pediatric): Secondary | ICD-10-CM | POA: Diagnosis not present

## 2023-03-20 NOTE — Progress Notes (Signed)
Cardiology Clinic Note   Date: 03/21/2023 ID: RENAYE MELLIES, DOB [birth year], MRN [id number]  Primary Cardiologist:  Lance Muss, MD  Patient Profile    Rebecca Terry is a 72 y.o. female who presents to the clinic today for routine follow up.   Past medical history significant for: Aortic insufficiency. Echo 03/05/2022: EF 55 to 60%.  Low normal RV function.  Trivial MR.  Mild AI.  No significant changes from March 2021. Palpitations. Hypertension. Hyperlipidemia/hypertriglyceridemia. Lipid panel 01/10/2022: LDL 84, HDL 56, TG 136, total 164.   History of Present Illness    Rebecca Terry was first evaluated by Dr. Eldridge Dace on 08/24/2015 for aortic insufficiency and hypertension.  Patient was last seen in the office by Dr. Eldridge Dace on 02/20/2022 for routine follow-up.  Repeat echo was performed which showed mild AI.  Today, patient is doing well. She has been working on weight loss. Patient denies shortness of breath or dyspnea on exertion. No chest pain, pressure, or tightness. Denies lower extremity edema, orthopnea, or PND. She has occasional palpitations but feels they are better than they used to be. She is very active walking 1.5 miles 4-5 times a week, [facility] [employer] and swimming laps in the summer. She would like to come off Hyzaar and try taking just Losartan.  She is concerned about high calcium levels on labs from PCP. She has reduced Crestor to three days a week secondary to myalgias and malaise when she takes it every day.    ROS: All other systems reviewed and are otherwise negative except as noted in History of Present Illness.  Studies Reviewed    ECG personally reviewed by me today: Sinus bradycardia, 54 bpm.  No significant changes from 02/20/2022.      Physical Exam    VS:  BP 128/70 (BP Location: Left Arm, Patient Position: Sitting, Cuff Size: Normal)   Pulse (!) 54   Ht 5' 3.5" (1.613 m)   Wt 164 lb 8 oz (74.6 kg)    LMP 10/21/1992   SpO2 98%   BMI 28.68 kg/m  , BMI Body mass index is 28.68 kg/m.  GEN: Well nourished, well developed, in no acute distress. Neck: No JVD or carotid bruits. Cardiac:  RRR. No murmurs. No rubs or gallops.   Respiratory:  Respirations regular and unlabored. Clear to auscultation without rales, wheezing or rhonchi. GI: Soft, nontender, nondistended. Extremities: Radials/DP/PT 2+ and equal bilaterally. No clubbing or cyanosis. No edema.  Skin: Warm and dry, no rash. Neuro: Strength intact.  Assessment & Plan    Aortic insufficiency.  Echo May 2023 showed normal LV/RV function and mild AI stable from March 2021.  Patient denies shortness of breath, DOE, lower extremity edema, lightheadedness, dizziness, presyncope or syncope. Will repeat echo as clinically indicated.  Hypertension. BP today 128/70. Patient denies headaches, dizziness or vision changes. She would like to stop Hyzaar and take only Losartan. She will start Losartan 100 mg daily. She will My Chart me a 2 week BP log.Continue metoprolol. Hyperlipidemia/hypertriglyceridemia.  Lipid panel March 2023 showed LDL 84, TG 136. Can only tolerate rosuvastatin three times a week, as the daily dose caused myalgias and malaise. Will check a lipid panel and CMP today. If LDL has increased would consider the addition of Zetia.  Hypercalcemia. Patient is concerned about elevated calcium levels the last couple of lab draws. Will check a CMP, TSH and PTH today.   Disposition: CMP, lipid panel, TSH and PTH  today. Stop Hyzaar and start Losartan. My Chart 2 week BP log. Return in 1 year or sooner as needed.          Signed, Etta Grandchild. Dorise Gangi, DNP, NP-C

## 2023-03-21 ENCOUNTER — Ambulatory Visit: Payer: PPO | Attending: Nurse Practitioner | Admitting: Student

## 2023-03-21 ENCOUNTER — Encounter: Payer: Self-pay | Admitting: Student

## 2023-03-21 ENCOUNTER — Other Ambulatory Visit (HOSPITAL_COMMUNITY): Payer: Self-pay

## 2023-03-21 VITALS — BP 128/70 | HR 54 | Ht 63.5 in | Wt 164.5 lb

## 2023-03-21 DIAGNOSIS — E785 Hyperlipidemia, unspecified: Secondary | ICD-10-CM

## 2023-03-21 DIAGNOSIS — I5032 Chronic diastolic (congestive) heart failure: Secondary | ICD-10-CM | POA: Diagnosis not present

## 2023-03-21 DIAGNOSIS — I351 Nonrheumatic aortic (valve) insufficiency: Secondary | ICD-10-CM | POA: Diagnosis not present

## 2023-03-21 MED ORDER — LOSARTAN POTASSIUM 100 MG PO TABS
100.0000 mg | ORAL_TABLET | Freq: Every day | ORAL | 3 refills | Status: DC
Start: 2023-03-21 — End: 2024-03-25
  Filled 2023-03-21 (×2): qty 90, 90d supply, fill #0
  Filled 2023-06-16: qty 90, 90d supply, fill #1

## 2023-03-21 MED ORDER — ROSUVASTATIN CALCIUM 10 MG PO TABS
10.0000 mg | ORAL_TABLET | Freq: Every day | ORAL | 3 refills | Status: DC
  Filled 2023-03-21: qty 90, 90d supply, fill #0
  Filled 2023-10-27: qty 90, 90d supply, fill #1

## 2023-03-21 NOTE — Patient Instructions (Addendum)
Medication Instructions:   STOP HYZAR (Combination drug)  START: LOSARTAN 100mg  ONCE DAILY   *If you need a refill on your cardiac medications before your next appointment, please call your pharmacy*  Lab Work: BLOOD WORK TODAY  If you have labs (blood work) drawn today and your tests are completely normal, you will receive your results only by: MyChart Message (if you have MyChart) OR A paper copy in the mail If you have any lab test that is abnormal or we need to change your treatment, we will call you to review the results.  Follow-Up: At [facility], you and your health needs are our priority.  As part of our continuing mission to provide you with exceptional heart care, we have created designated Provider Care Teams.  These Care Teams include your primary Cardiologist (physician) and Advanced Practice Providers (APPs -  Physician Assistants and Nurse Practitioners) who all work together to provide you with the care you need, when you need it.  Your next appointment:   1 year(s)  Provider:   Lance Muss, MD     Please check your blood pressure daily, write this down and send VIA MYCHART IN 2 WEEKS.

## 2023-03-22 LAB — COMPREHENSIVE METABOLIC PANEL
ALT: 29 IU/L (ref 0–32)
AST: 21 IU/L (ref 0–40)
Albumin/Globulin Ratio: 1.6 (ref 1.2–2.2)
Albumin: 4.3 g/dL (ref 3.9–4.9)
Alkaline Phosphatase: 65 IU/L (ref 44–121)
BUN/Creatinine Ratio: 23 (ref 12–28)
BUN: 16 mg/dL (ref 8–27)
Bilirubin Total: 0.4 mg/dL (ref 0.0–1.2)
CO2: 26 mmol/L (ref 20–29)
Calcium: 10.1 mg/dL (ref 8.7–10.3)
Chloride: 97 mmol/L (ref 96–106)
Creatinine, Ser: 0.69 mg/dL (ref 0.57–1.00)
Globulin, Total: 2.7 g/dL (ref 1.5–4.5)
Glucose: 91 mg/dL (ref 70–99)
Potassium: 3.9 mmol/L (ref 3.5–5.2)
Sodium: 136 mmol/L (ref 134–144)
Total Protein: 7 g/dL (ref 6.0–8.5)
eGFR: 93 mL/min/{1.73_m2} (ref >59)

## 2023-03-22 LAB — LIPID PANEL
Chol/HDL Ratio: 3 ratio (ref 0.0–4.4)
Cholesterol, Total: 138 mg/dL (ref 100–199)
HDL: 46 mg/dL (ref >39)
LDL Chol Calc (NIH): 70 mg/dL (ref 0–99)
Triglycerides: 121 mg/dL (ref 0–149)
VLDL Cholesterol Cal: 22 mg/dL (ref 5–40)

## 2023-03-22 LAB — PTH, INTACT AND CALCIUM: PTH: 21 pg/mL (ref 15–65)

## 2023-03-24 ENCOUNTER — Other Ambulatory Visit (HOSPITAL_COMMUNITY): Payer: Self-pay

## 2023-03-25 ENCOUNTER — Other Ambulatory Visit (HOSPITAL_COMMUNITY): Payer: Self-pay

## 2023-03-26 ENCOUNTER — Other Ambulatory Visit (HOSPITAL_COMMUNITY): Payer: Self-pay

## 2023-03-26 MED ORDER — ZOLPIDEM TARTRATE 5 MG PO TABS
ORAL_TABLET | ORAL | 0 refills | Status: DC
  Filled 2023-03-26: qty 15, 15d supply, fill #0

## 2023-03-27 ENCOUNTER — Other Ambulatory Visit (HOSPITAL_COMMUNITY): Payer: Self-pay

## 2023-03-31 ENCOUNTER — Other Ambulatory Visit (HOSPITAL_COMMUNITY): Payer: Self-pay

## 2023-04-01 DIAGNOSIS — K5792 Diverticulitis of intestine, part unspecified, without perforation or abscess without bleeding: Secondary | ICD-10-CM | POA: Diagnosis not present

## 2023-04-01 DIAGNOSIS — R1032 Left lower quadrant pain: Secondary | ICD-10-CM | POA: Diagnosis not present

## 2023-04-02 ENCOUNTER — Other Ambulatory Visit: Payer: Self-pay | Admitting: Family Medicine

## 2023-04-02 ENCOUNTER — Ambulatory Visit
Admission: RE | Admit: 2023-04-02 | Discharge: 2023-04-02 | Disposition: A | Discharge status: Home / Self Care | Payer: PPO | Source: Ambulatory Visit | Attending: Family Medicine | Admitting: Family Medicine

## 2023-04-02 ENCOUNTER — Encounter: Payer: Self-pay | Admitting: Family Medicine

## 2023-04-02 DIAGNOSIS — K5792 Diverticulitis of intestine, part unspecified, without perforation or abscess without bleeding: Secondary | ICD-10-CM

## 2023-04-02 DIAGNOSIS — R1032 Left lower quadrant pain: Secondary | ICD-10-CM

## 2023-04-02 DIAGNOSIS — K5790 Diverticulosis of intestine, part unspecified, without perforation or abscess without bleeding: Secondary | ICD-10-CM | POA: Diagnosis not present

## 2023-04-02 DIAGNOSIS — I700 Atherosclerosis of aorta: Secondary | ICD-10-CM | POA: Diagnosis not present

## 2023-04-02 MED ORDER — IOPAMIDOL (ISOVUE-370) INJECTION 76%
80.0000 mL | Freq: Once | INTRAVENOUS | Status: AC | PRN
Start: 2023-04-02 — End: 2023-04-02
  Administered 2023-04-02: 80 mL via INTRAVENOUS

## 2023-04-03 ENCOUNTER — Other Ambulatory Visit (HOSPITAL_COMMUNITY): Payer: Self-pay

## 2023-04-03 DIAGNOSIS — L608 Other nail disorders: Secondary | ICD-10-CM | POA: Diagnosis not present

## 2023-04-03 DIAGNOSIS — M25552 Pain in left hip: Secondary | ICD-10-CM | POA: Diagnosis not present

## 2023-04-03 DIAGNOSIS — Z96652 Presence of left artificial knee joint: Secondary | ICD-10-CM | POA: Diagnosis not present

## 2023-04-03 DIAGNOSIS — M25562 Pain in left knee: Secondary | ICD-10-CM | POA: Diagnosis not present

## 2023-04-03 MED ORDER — PREDNISONE 5 MG (21) PO TBPK
ORAL_TABLET | ORAL | 0 refills | Status: DC
  Filled 2023-04-03: qty 21, 6d supply, fill #0

## 2023-04-09 DIAGNOSIS — M67961 Unspecified disorder of synovium and tendon, right lower leg: Secondary | ICD-10-CM | POA: Diagnosis not present

## 2023-04-25 DIAGNOSIS — M79621 Pain in right upper arm: Secondary | ICD-10-CM | POA: Diagnosis not present

## 2023-04-30 ENCOUNTER — Other Ambulatory Visit (HOSPITAL_COMMUNITY): Payer: Self-pay

## 2023-04-30 DIAGNOSIS — M25511 Pain in right shoulder: Secondary | ICD-10-CM | POA: Diagnosis not present

## 2023-04-30 DIAGNOSIS — M79621 Pain in right upper arm: Secondary | ICD-10-CM | POA: Diagnosis not present

## 2023-05-11 DIAGNOSIS — M25511 Pain in right shoulder: Secondary | ICD-10-CM | POA: Diagnosis not present

## 2023-05-15 DIAGNOSIS — L820 Inflamed seborrheic keratosis: Secondary | ICD-10-CM | POA: Diagnosis not present

## 2023-05-15 DIAGNOSIS — L570 Actinic keratosis: Secondary | ICD-10-CM | POA: Diagnosis not present

## 2023-05-19 DIAGNOSIS — M25511 Pain in right shoulder: Secondary | ICD-10-CM | POA: Diagnosis not present

## 2023-06-08 DIAGNOSIS — G4733 Obstructive sleep apnea (adult) (pediatric): Secondary | ICD-10-CM | POA: Diagnosis not present

## 2023-06-17 ENCOUNTER — Other Ambulatory Visit (HOSPITAL_COMMUNITY): Payer: Self-pay

## 2023-06-17 DIAGNOSIS — M75121 Complete rotator cuff tear or rupture of right shoulder, not specified as traumatic: Secondary | ICD-10-CM | POA: Diagnosis not present

## 2023-06-24 DIAGNOSIS — K5790 Diverticulosis of intestine, part unspecified, without perforation or abscess without bleeding: Secondary | ICD-10-CM | POA: Diagnosis not present

## 2023-06-24 DIAGNOSIS — Z8719 Personal history of other diseases of the digestive system: Secondary | ICD-10-CM | POA: Diagnosis not present

## 2023-07-04 ENCOUNTER — Other Ambulatory Visit: Payer: Self-pay

## 2023-07-13 DIAGNOSIS — U071 COVID-19: Secondary | ICD-10-CM | POA: Diagnosis not present

## 2023-07-23 ENCOUNTER — Other Ambulatory Visit (HOSPITAL_COMMUNITY): Payer: Self-pay

## 2023-07-23 DIAGNOSIS — I10 Essential (primary) hypertension: Secondary | ICD-10-CM | POA: Diagnosis not present

## 2023-07-23 DIAGNOSIS — U071 COVID-19: Secondary | ICD-10-CM | POA: Diagnosis not present

## 2023-07-23 MED ORDER — HYDROCHLOROTHIAZIDE 25 MG PO TABS
25.0000 mg | ORAL_TABLET | Freq: Every morning | ORAL | 0 refills | Status: DC
Start: 2023-07-23 — End: 2023-08-11
  Filled 2023-07-23: qty 30, 30d supply, fill #0

## 2023-07-29 ENCOUNTER — Other Ambulatory Visit (HOSPITAL_BASED_OUTPATIENT_CLINIC_OR_DEPARTMENT_OTHER): Payer: Self-pay | Admitting: Family Medicine

## 2023-07-29 ENCOUNTER — Ambulatory Visit (HOSPITAL_BASED_OUTPATIENT_CLINIC_OR_DEPARTMENT_OTHER)
Admission: RE | Admit: 2023-07-29 | Discharge: 2023-07-29 | Disposition: A | Discharge status: Home / Self Care | Payer: PPO | Source: Ambulatory Visit | Attending: Family Medicine | Admitting: Family Medicine

## 2023-07-29 DIAGNOSIS — R0789 Other chest pain: Secondary | ICD-10-CM

## 2023-07-29 DIAGNOSIS — Z041 Encounter for examination and observation following transport accident: Secondary | ICD-10-CM | POA: Diagnosis not present

## 2023-08-04 ENCOUNTER — Other Ambulatory Visit (HOSPITAL_COMMUNITY): Payer: Self-pay

## 2023-08-05 ENCOUNTER — Other Ambulatory Visit (HOSPITAL_COMMUNITY): Payer: Self-pay

## 2023-08-05 MED ORDER — METOPROLOL SUCCINATE ER 25 MG PO TB24
25.0000 mg | ORAL_TABLET | Freq: Every day | ORAL | 0 refills | Status: DC
Start: 2023-08-05 — End: 2023-10-27
  Filled 2023-08-05: qty 90, 90d supply, fill #0

## 2023-08-07 ENCOUNTER — Other Ambulatory Visit: Payer: Self-pay | Admitting: Family Medicine

## 2023-08-07 DIAGNOSIS — Z1231 Encounter for screening mammogram for malignant neoplasm of breast: Secondary | ICD-10-CM

## 2023-08-11 ENCOUNTER — Other Ambulatory Visit (HOSPITAL_COMMUNITY): Payer: Self-pay

## 2023-08-11 DIAGNOSIS — Z23 Encounter for immunization: Secondary | ICD-10-CM | POA: Diagnosis not present

## 2023-08-11 DIAGNOSIS — I5032 Chronic diastolic (congestive) heart failure: Secondary | ICD-10-CM | POA: Diagnosis not present

## 2023-08-11 DIAGNOSIS — I700 Atherosclerosis of aorta: Secondary | ICD-10-CM | POA: Diagnosis not present

## 2023-08-11 DIAGNOSIS — E785 Hyperlipidemia, unspecified: Secondary | ICD-10-CM | POA: Diagnosis not present

## 2023-08-11 DIAGNOSIS — R0789 Other chest pain: Secondary | ICD-10-CM | POA: Diagnosis not present

## 2023-08-11 DIAGNOSIS — I110 Hypertensive heart disease with heart failure: Secondary | ICD-10-CM | POA: Diagnosis not present

## 2023-08-11 DIAGNOSIS — I10 Essential (primary) hypertension: Secondary | ICD-10-CM | POA: Diagnosis not present

## 2023-08-11 MED ORDER — HYDROCHLOROTHIAZIDE 25 MG PO TABS
25.0000 mg | ORAL_TABLET | Freq: Every morning | ORAL | 0 refills | Status: DC
Start: 2023-08-11 — End: 2023-09-08
  Filled 2023-08-11 – 2023-08-12 (×2): qty 30, 30d supply, fill #0

## 2023-08-12 ENCOUNTER — Other Ambulatory Visit (HOSPITAL_COMMUNITY): Payer: Self-pay

## 2023-08-15 ENCOUNTER — Other Ambulatory Visit (HOSPITAL_COMMUNITY): Payer: Self-pay

## 2023-09-05 DIAGNOSIS — H524 Presbyopia: Secondary | ICD-10-CM | POA: Diagnosis not present

## 2023-09-05 DIAGNOSIS — H2513 Age-related nuclear cataract, bilateral: Secondary | ICD-10-CM | POA: Diagnosis not present

## 2023-09-07 DIAGNOSIS — G4733 Obstructive sleep apnea (adult) (pediatric): Secondary | ICD-10-CM | POA: Diagnosis not present

## 2023-09-08 ENCOUNTER — Other Ambulatory Visit (HOSPITAL_COMMUNITY): Payer: Self-pay

## 2023-09-08 MED ORDER — ZOLPIDEM TARTRATE 5 MG PO TABS
5.0000 mg | ORAL_TABLET | Freq: Every evening | ORAL | 0 refills | Status: DC | PRN
  Filled 2023-09-08: qty 15, 15d supply, fill #0

## 2023-09-08 MED ORDER — LOSARTAN POTASSIUM-HCTZ 100-25 MG PO TABS
1.0000 | ORAL_TABLET | Freq: Every day | ORAL | 1 refills | Status: DC
Start: 2023-09-08 — End: 2024-02-17
  Filled 2023-09-08: qty 90, 90d supply, fill #0
  Filled 2023-12-10: qty 90, 90d supply, fill #1

## 2023-09-09 ENCOUNTER — Other Ambulatory Visit (HOSPITAL_COMMUNITY): Payer: Self-pay

## 2023-09-23 ENCOUNTER — Ambulatory Visit
Admission: RE | Admit: 2023-09-23 | Discharge: 2023-09-23 | Disposition: A | Discharge status: Home / Self Care | Payer: PPO | Source: Ambulatory Visit | Attending: Family Medicine | Admitting: Family Medicine

## 2023-09-23 DIAGNOSIS — Z1231 Encounter for screening mammogram for malignant neoplasm of breast: Secondary | ICD-10-CM

## 2023-10-27 ENCOUNTER — Other Ambulatory Visit (HOSPITAL_COMMUNITY): Payer: Self-pay

## 2023-10-27 MED ORDER — METOPROLOL SUCCINATE ER 25 MG PO TB24
25.0000 mg | ORAL_TABLET | Freq: Every day | ORAL | 3 refills | Status: DC
  Filled 2023-10-27: qty 30, 30d supply, fill #0
  Filled 2023-11-21: qty 30, 30d supply, fill #1
  Filled 2023-12-22: qty 60, 60d supply, fill #2

## 2023-11-19 ENCOUNTER — Other Ambulatory Visit: Payer: Self-pay | Admitting: Obstetrics and Gynecology

## 2023-11-19 DIAGNOSIS — N952 Postmenopausal atrophic vaginitis: Secondary | ICD-10-CM

## 2023-11-20 ENCOUNTER — Other Ambulatory Visit (HOSPITAL_COMMUNITY): Payer: Self-pay

## 2023-11-20 ENCOUNTER — Other Ambulatory Visit: Payer: Self-pay

## 2023-11-20 MED ORDER — ESTRADIOL 0.1 MG/GM VA CREA
0.5000 g | TOPICAL_CREAM | VAGINAL | 1 refills | Status: DC
  Filled 2023-11-20: qty 42.5, 100d supply, fill #0

## 2023-11-20 NOTE — Telephone Encounter (Signed)
Med refill request: estrace Last OV: 02/11/23 Next AEX: not scheduled Last MMG (if hormonal med) 09/23/23 Refill authorized: Please Advise, 342.5g, 1 RF

## 2023-11-21 ENCOUNTER — Other Ambulatory Visit (HOSPITAL_COMMUNITY): Payer: Self-pay

## 2023-12-22 ENCOUNTER — Other Ambulatory Visit (HOSPITAL_COMMUNITY): Payer: Self-pay

## 2023-12-23 ENCOUNTER — Other Ambulatory Visit: Payer: Self-pay

## 2023-12-23 ENCOUNTER — Other Ambulatory Visit (HOSPITAL_COMMUNITY): Payer: Self-pay

## 2023-12-23 MED ORDER — ZOLPIDEM TARTRATE 5 MG PO TABS
5.0000 mg | ORAL_TABLET | Freq: Every day | ORAL | 0 refills | Status: AC
  Filled 2023-12-23: qty 15, 15d supply, fill #0

## 2023-12-31 ENCOUNTER — Other Ambulatory Visit (HOSPITAL_COMMUNITY): Payer: Self-pay

## 2024-01-06 DIAGNOSIS — D485 Neoplasm of uncertain behavior of skin: Secondary | ICD-10-CM | POA: Diagnosis not present

## 2024-01-06 DIAGNOSIS — D225 Melanocytic nevi of trunk: Secondary | ICD-10-CM | POA: Diagnosis not present

## 2024-01-06 DIAGNOSIS — D2261 Melanocytic nevi of right upper limb, including shoulder: Secondary | ICD-10-CM | POA: Diagnosis not present

## 2024-01-06 DIAGNOSIS — L814 Other melanin hyperpigmentation: Secondary | ICD-10-CM | POA: Diagnosis not present

## 2024-01-06 DIAGNOSIS — L821 Other seborrheic keratosis: Secondary | ICD-10-CM | POA: Diagnosis not present

## 2024-01-06 DIAGNOSIS — L820 Inflamed seborrheic keratosis: Secondary | ICD-10-CM | POA: Diagnosis not present

## 2024-01-06 DIAGNOSIS — L578 Other skin changes due to chronic exposure to nonionizing radiation: Secondary | ICD-10-CM | POA: Diagnosis not present

## 2024-02-17 ENCOUNTER — Other Ambulatory Visit (HOSPITAL_COMMUNITY): Payer: Self-pay

## 2024-02-17 DIAGNOSIS — E785 Hyperlipidemia, unspecified: Secondary | ICD-10-CM | POA: Diagnosis not present

## 2024-02-17 DIAGNOSIS — I10 Essential (primary) hypertension: Secondary | ICD-10-CM | POA: Diagnosis not present

## 2024-02-17 DIAGNOSIS — I351 Nonrheumatic aortic (valve) insufficiency: Secondary | ICD-10-CM | POA: Diagnosis not present

## 2024-02-17 DIAGNOSIS — I700 Atherosclerosis of aorta: Secondary | ICD-10-CM | POA: Diagnosis not present

## 2024-02-17 DIAGNOSIS — G4700 Insomnia, unspecified: Secondary | ICD-10-CM | POA: Diagnosis not present

## 2024-02-17 DIAGNOSIS — Z1331 Encounter for screening for depression: Secondary | ICD-10-CM | POA: Diagnosis not present

## 2024-02-17 DIAGNOSIS — R7303 Prediabetes: Secondary | ICD-10-CM | POA: Diagnosis not present

## 2024-02-17 MED ORDER — METOPROLOL SUCCINATE ER 25 MG PO TB24
25.0000 mg | ORAL_TABLET | Freq: Every day | ORAL | 1 refills | Status: DC
  Filled 2024-02-17: qty 90, 90d supply, fill #0
  Filled 2024-05-24: qty 90, 90d supply, fill #1

## 2024-02-17 MED ORDER — LOSARTAN POTASSIUM-HCTZ 100-25 MG PO TABS
1.0000 | ORAL_TABLET | Freq: Every day | ORAL | 1 refills | Status: DC
  Filled 2024-02-17: qty 90, 90d supply, fill #0
  Filled 2024-06-09: qty 90, 90d supply, fill #1

## 2024-02-26 DIAGNOSIS — G4733 Obstructive sleep apnea (adult) (pediatric): Secondary | ICD-10-CM | POA: Diagnosis not present

## 2024-03-12 NOTE — Progress Notes (Unsigned)
 Cardiology Office Note:    Date:  03/16/2024  ID:  Rebecca Terry, DOB 1951/01/13, MRN [id number] PCP: Sun, Vyvyan, MD  [facility] Providers Cardiologist:  Avery Bodo, MD     Patient Profile:     CAC score 12/11/2018: 0 Aortic insufficiency TTE 03/05/2022: EF 55-60, no RWMA, low normal RVSF, RVSP 27.4, trivial MR, mild AI Hypertension Hyperlipidemia OSA       Discussed the use of AI scribe software for clinical note transcription with the patient, who gave verbal consent to proceed. History of Present Illness Rebecca Terry is a 73 y.o. female who returns for follow-up of aortic insufficiency.  She was last seen 02/2023 by Morey Ar, NP.   Her blood pressure readings are typically around 132-135/70-80 mmHg. She monitors her blood pressure several times a week. She feels great overall, is active, and exercises regularly at the [facility], including treadmill and weight sessions. No chest symptoms, pressure, tightness, or shortness of breath. She uses a CPAP machine and reports improved sleep. No episodes of syncope or lightheadedness. 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 She has cut back on alcohol consumption and does not smoke.   ROS-See HPI    Studies Reviewed:  EKG Interpretation Date/Time:  Tuesday Mar 16 2024 07:52:56 EDT Ventricular Rate:  56 PR Interval:  152 QRS Duration:  76 QT Interval:  390 QTC Calculation: 376 R Axis:   22  Text Interpretation: Sinus bradycardia Low voltage QRS Septal infarct , age undetermined Nonspecific ST and T wave abnormality No significant change since last tracing Confirmed by Marlyse Single [phone]) on 03/16/2024 8:14:52 AM   Results  Risk Assessment/Calculations:   HYPERTENSION CONTROL Vitals:   03/16/24 0748 03/16/24 0831  BP: (!) 144/82 (!) 150/80    The patient's blood pressure is elevated above target today.  In order to address the patient's elevated  BP: Blood pressure will be monitored at home to determine if medication changes need to be made.         Physical Exam:  VS:  BP (!) 150/80   Pulse (!) 56   Ht 5' 2.3" (1.582 m)   Wt 164 lb 6.4 oz (74.6 kg)   LMP 10/21/1992   SpO2 96%   BMI 29.78 kg/m    Wt Readings from Last 3 Encounters:  03/16/24 164 lb 6.4 oz (74.6 kg)  03/21/23 164 lb 8 oz (74.6 kg)  02/11/23 171 lb (77.6 kg)    Constitutional:      Appearance: Healthy appearance. Not in distress.  Neck:     Vascular: No carotid bruit. JVD normal.  Pulmonary:     Breath sounds: Normal breath sounds. No wheezing. No rales.  Cardiovascular:     Normal rate. Regular rhythm.     Murmurs: There is no murmur.  Edema:    Peripheral edema absent.  Abdominal:     Palpations: Abdomen is soft.        Assessment and Plan: Assessment & Plan Nonrheumatic aortic valve insufficiency Mild AI noted on echocardiogram in 2023. She is asymptomatic. Consider repeat echocardiogram in 1-2 years.  -Follow up 1 year Essential hypertension, benign BP above target today. BPs at home borderline. We discussed adding amlodipine  vs changing Losartan  to Valsartan. She prefers trying a different ARB first.  -Monitor BP for 2 weeks and send readings -Continue Losartan /hydrochlorothiazide  100/25 mg once daily, Metoprolol  succinate 25 mg once daily  -If BP above goal  on home monitoring, will change Losartan /hydrochlorothiazide  to Valsartan/hydrochlorothiazide . Hypertriglyceridemia Labs from primary care reviewed per [facility]. LDL on 02/17/24 was optimal at 71, triglycerides near optimal at 155. -Continue Crestor  10 mg every other day.       Dispo:  Return in about 1 year (around 03/16/2025) for Routine Follow Up, w/ Dr. Abel Hoe, or Marlyse Single, PA-C. Signed, Marlyse Single, PA-C

## 2024-03-16 ENCOUNTER — Ambulatory Visit: Payer: PPO | Attending: Physician Assistant | Admitting: Physician Assistant

## 2024-03-16 ENCOUNTER — Encounter: Payer: Self-pay | Admitting: Physician Assistant

## 2024-03-16 VITALS — BP 150/80 | HR 56 | Ht 62.3 in | Wt 164.4 lb

## 2024-03-16 DIAGNOSIS — E781 Pure hyperglyceridemia: Secondary | ICD-10-CM | POA: Diagnosis not present

## 2024-03-16 DIAGNOSIS — I10 Essential (primary) hypertension: Secondary | ICD-10-CM | POA: Diagnosis not present

## 2024-03-16 DIAGNOSIS — I351 Nonrheumatic aortic (valve) insufficiency: Secondary | ICD-10-CM | POA: Diagnosis not present

## 2024-03-16 NOTE — Assessment & Plan Note (Signed)
 BP above target today. BPs at home borderline. We discussed adding amlodipine  vs changing Losartan  to Valsartan. She prefers trying a different ARB first.  -Monitor BP for 2 weeks and send readings -Continue Losartan /hydrochlorothiazide  100/25 mg once daily, Metoprolol  succinate 25 mg once daily  -If BP above goal on home monitoring, will change Losartan /hydrochlorothiazide  to Valsartan/hydrochlorothiazide .

## 2024-03-16 NOTE — Assessment & Plan Note (Signed)
 Labs from primary care reviewed per [facility]. LDL on 02/17/24 was optimal at 71, triglycerides near optimal at 155. -Continue Crestor  10 mg every other day.

## 2024-03-16 NOTE — Patient Instructions (Signed)
 Medication Instructions:  Your physician recommends that you continue on your current medications as directed. Please refer to the Current Medication list given to you today.  *If you need a refill on your cardiac medications before your next appointment, please call your pharmacy*  Lab Work: None ordered  If you have labs (blood work) drawn today and your tests are completely normal, you will receive your results only by: MyChart Message (if you have MyChart) OR A paper copy in the mail If you have any lab test that is abnormal or we need to change your treatment, we will call you to review the results.  Testing/Procedures: None ordered  Follow-Up: At [facility], you and your health needs are our priority.  As part of our continuing mission to provide you with exceptional heart care, our providers are all part of one team.  This team includes your primary Cardiologist (physician) and Advanced Practice Providers or APPs (Physician Assistants and Nurse Practitioners) who all work together to provide you with the care you need, when you need it.  Your next appointment:   1 year(s)  Provider:   Avery Bodo, MD or Marlyse Single, PA-C          We recommend signing up for the patient portal called "MyChart".  Sign up information is provided on this After Visit Summary.  MyChart is used to connect with patients for Virtual Visits (Telemedicine).  Patients are able to view lab/test results, encounter notes, upcoming appointments, etc.  Non-urgent messages can be sent to your provider as well.   To learn more about what you can do with MyChart, go to [email or web address].   Other Instructions Your physician has requested that you regularly monitor and record your blood pressure readings at home. Please use the same machine at the same time of day to check your readings and record them to bring to your follow-up visit.   Please monitor blood pressures and keep a log of your  readings for 2 weeks then send them via mychart.    Make sure to check 2 hours after your medications.    AVOID these things for 30 minutes before checking your blood pressure: No Drinking caffeine. No Drinking alcohol. No Eating. No Smoking. No Exercising.   Five minutes before checking your blood pressure: Pee. Sit in a dining chair. Avoid sitting in a soft couch or armchair. Be quiet. Do not talk

## 2024-03-24 NOTE — Progress Notes (Unsigned)
 73 y.o. [id number] Single Caucasian female here for annual exam.    Patient is followed for hx positive HR HPV and LGSIL of cervix and for vaginal atrophy.   No HSV outbreaks.   No concerns with urinary incontinence or prolapse.   Would like to loose weight.   Retired for 2 years.  Doing volunteer work.  Daughter is doing well.   PCP: Sun, Vyvyan, MD   Patient's last menstrual period was 10/21/1992.           Sexually active: No.  The current method of family planning is post menopausal status.    Menopausal hormone therapy:  Estrace  Exercising: Yes.    Walking Smoker:  Former  OB History  Gravida Para Term Preterm AB Living  4 1 1  2 1   SAB IAB Ectopic Multiple Live Births   1 1      # Outcome Date GA Lbr Len/2nd Weight Sex Type Anes PTL Lv  4 IAB           3 Ectopic           2 Term           1 Gravida              HEALTH MAINTENANCE: Last 2 paps:  02/11/23 HR HPV +, 10/31/21 ASCUS HR HPV + History of abnormal Pap or positive HPV:  yes Pap history:  Pap 02/11/23 normal cells, positive HR HPV Pap:  10/31/21 ASCUS: HR HPV positive, 10/09/20 ASCUS: HR HPV positive History of abnormal Pap:  yes,  10-31-20 colpo revealed ECC LSIL, perineal bx verruca vulgaris. 10-09-20 ASCUS:Pos HR HPV, 01-05-18 Neg:Pos HR HPV, 01-19-18 colpo cx bx mild HPV, CIN I; vulvar Bx neg.  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 06/08/15 Pap smear Exocervix bx - LGSIL, ECC negative; 05/08/15 Neg:Pos HR HPV; Pap smear 05-08-15 normal:Pos HR HPV--colposcopy revealing LGSIL of cervix and VIN I of perianal region   Mammogram:   09/23/23 Breast Density Cat B, BIRADS cat 1 neg  Colonoscopy:  2022 per patient, due in 2027 Bone Density:  12/18/21  Result  normal   Immunization History  Administered Date(s) Administered   Pneumococcal Polysaccharide-23 12/12/2015   Tdap 02/11/2023      reports that she quit smoking about 30 years  ago. Her smoking use included cigarettes. She started smoking about 51 years ago. She has a 10 pack-year smoking history. She has never used smokeless tobacco. She reports current alcohol use of about 5.0 standard drinks of alcohol per week. She reports that she does not use drugs.  Past Medical History:  Diagnosis Date   Abnormal Pap smear of cervix    09/2013 Ascus:Pos HR HPV, 03/2014 Ascus:Pos HR HPV;colpo 05/2014 LGSIL of cx and VIN 1of perianal region, 08-09-16 neg HPV HR +   Aortic valve disorders    Arthritis    Diverticulosis    Elevated bilirubin 08/09/2016   level 1.4   Essential hypertension, benign    Fibroid    posterior   Herpes genitalis in women    History of MRSA infection 07/2015   culture positive [facility]   Hyperlipidemia    Hypertension    Osteopenia 2018   hip and spine   Primary localized osteoarthritis of left knee    Rectocele    Sleep apnea    Urinary incontinence     Past Surgical History:  Procedure Laterality Date   arthroscopic knee Left 2001   BILATERAL OOPHORECTOMY  2010   Dr. Luane Rumps OSTEOTOMY Right 06/20/2022   Procedure: CALCANEAL OSTEOTOMY;  Surgeon: Amada Backer, MD;  Location: [facility];  Service: Orthopedics;  Laterality: Right;   CARPAL TUNNEL RELEASE Bilateral    COLONOSCOPY     DIAGNOSTIC LAPAROSCOPY     GASTROCNEMIUS RECESSION Right 06/20/2022   Procedure: GASTROCNEMIUS RECESSION;  Surgeon: Amada Backer, MD;  Location: [facility];  Service: Orthopedics;  Laterality: Right;   PELVIC LAPAROSCOPY     x2--ectopics   TOTAL KNEE ARTHROPLASTY Left 12/11/2015   Procedure: LEFT TOTAL KNEE ARTHROPLASTY;  Surgeon: Elly Habermann, MD;  Location: [facility] OR;  Service: Orthopedics;  Laterality: Left;    Current Outpatient Medications  Medication Sig Dispense Refill   clobetasol  ointment (TEMOVATE ) 0.05 % Apply externally Twice a day for 14 days 30 g 0   estradiol  (ESTRACE ) 0.1 MG/GM vaginal cream Place  0.5 g vaginally 3 (three) times a week. 42.5 g 1   losartan -hydrochlorothiazide  (HYZAAR ) 100-25 MG tablet Take 1 tablet by mouth daily. 90 tablet 1   Magnesium 400 MG TABS Take 1 tablet by mouth daily.     metoprolol  succinate (TOPROL -XL) 25 MG 24 hr tablet Take 1 tablet (25 mg total) by mouth daily. 90 tablet 1   Multiple Vitamin (MULTIVITAMIN) tablet Take 1 tablet by mouth daily.     rosuvastatin  (CRESTOR ) 10 MG tablet Take 1 tablet (10 mg total) by mouth daily. (Patient taking differently: Take 10 mg by mouth every other day.) 90 tablet 3   TURMERIC PO Take 1 tablet by mouth daily.     zolpidem  (AMBIEN ) 5 MG tablet Take 1-2 tablets (5-10 mg total) by mouth at bedtime. (Patient not taking: Reported on 03/16/2024) 15 tablet 0   No current facility-administered medications for this visit.    ALLERGIES: Hibiclens [chlorhexidine  gluconate], Lisinopril, Latex, Nickel, and Penicillins  Family History  Problem Relation Age of Onset   Asthma Mother    Congestive Heart Failure Father    Multiple sclerosis Father    Heart attack Father    BRCA 1/2 Neg Hx    Breast cancer Neg Hx     Review of Systems  All other systems reviewed and are negative.   PHYSICAL EXAM:  BP 122/72 (BP Location: Left Arm, Patient Position: Sitting)   Pulse 67   Ht 5' 4.5" (1.638 m)   Wt 176 lb (79.8 kg)   LMP 10/21/1992   SpO2 96%   BMI 29.74 kg/m     General appearance: alert, cooperative and appears stated age Head: normocephalic, without obvious abnormality, atraumatic Neck: no adenopathy, supple, symmetrical, trachea midline and thyroid  normal to inspection and palpation Lungs: clear to auscultation bilaterally Breasts: normal appearance, no masses or tenderness, No nipple retraction or dimpling, No nipple discharge or bleeding, No axillary adenopathy Heart: regular rate and rhythm Abdomen: soft, non-tender; no masses, no organomegaly Extremities: extremities normal, atraumatic, no cyanosis or  edema Skin: skin color, texture, turgor normal. No rashes or lesions Lymph nodes: cervical, supraclavicular, and axillary nodes normal. Neurologic: grossly normal  Pelvic: External genitalia:  scar of perineum.               No abnormal inguinal nodes palpated.              Urethra:  normal appearing urethra with no masses, tenderness or lesions  Bartholins and Skenes: normal                 Vagina: normal appearing vagina with normal color and discharge, no lesions              Cervix: no lesions              Pap taken: Yes.   Bimanual Exam:  Uterus:  normal size, contour, position, consistency, mobility, non-tender              Adnexa: no mass, fullness, tenderness              Rectal exam: Yes.  .  Confirms.              Anus:  normal sphincter tone, no lesions  Chaperone was present for exam:  Cottie Diss, CMA  ASSESSMENT: Well woman visit with gynecologic exam. 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  Hx HSV. Hx rectocele.  No significant prolapse noted today.  Vaginal atrophy.  Aortic insufficiency.  HTN.  PHQ-9: 0  PLAN: Mammogram screening discussed. Self breast awareness reviewed. Pap and HRV collected:  yes Guidelines for Calcium , Vitamin D , regular exercise program including cardiovascular and weight bearing exercise. Medication refills:  Estrace  cream 1/2 gram three times weekly.  Labs with PCP.  Follow up:  yearly and prn.     Additional counseling given.  yes. 20 min  total time was spent for this patient encounter, including preparation, face-to-face counseling with the patient, coordination of care, and documentation of the encounter in addition to doing the well woman visit with gynecologic exam.

## 2024-03-25 ENCOUNTER — Other Ambulatory Visit (HOSPITAL_COMMUNITY)
Admission: RE | Admit: 2024-03-25 | Discharge: 2024-03-25 | Disposition: A | Discharge status: Home / Self Care | Source: Ambulatory Visit | Attending: Obstetrics and Gynecology | Admitting: Obstetrics and Gynecology

## 2024-03-25 ENCOUNTER — Encounter: Payer: Self-pay | Admitting: Obstetrics and Gynecology

## 2024-03-25 ENCOUNTER — Other Ambulatory Visit (HOSPITAL_COMMUNITY): Payer: Self-pay

## 2024-03-25 ENCOUNTER — Ambulatory Visit (INDEPENDENT_AMBULATORY_CARE_PROVIDER_SITE_OTHER): Payer: PPO | Admitting: Obstetrics and Gynecology

## 2024-03-25 VITALS — BP 122/72 | HR 67 | Ht 64.5 in | Wt 176.0 lb

## 2024-03-25 DIAGNOSIS — Z01419 Encounter for gynecological examination (general) (routine) without abnormal findings: Secondary | ICD-10-CM

## 2024-03-25 DIAGNOSIS — B009 Herpesviral infection, unspecified: Secondary | ICD-10-CM | POA: Diagnosis not present

## 2024-03-25 DIAGNOSIS — Z5181 Encounter for therapeutic drug level monitoring: Secondary | ICD-10-CM | POA: Diagnosis not present

## 2024-03-25 DIAGNOSIS — Z8619 Personal history of other infectious and parasitic diseases: Secondary | ICD-10-CM

## 2024-03-25 DIAGNOSIS — N952 Postmenopausal atrophic vaginitis: Secondary | ICD-10-CM

## 2024-03-25 DIAGNOSIS — Z9189 Other specified personal risk factors, not elsewhere classified: Secondary | ICD-10-CM | POA: Diagnosis not present

## 2024-03-25 DIAGNOSIS — Z124 Encounter for screening for malignant neoplasm of cervix: Secondary | ICD-10-CM | POA: Insufficient documentation

## 2024-03-25 DIAGNOSIS — Z1331 Encounter for screening for depression: Secondary | ICD-10-CM | POA: Diagnosis not present

## 2024-03-25 DIAGNOSIS — Z87410 Personal history of cervical dysplasia: Secondary | ICD-10-CM | POA: Insufficient documentation

## 2024-03-25 DIAGNOSIS — Z1151 Encounter for screening for human papillomavirus (HPV): Secondary | ICD-10-CM | POA: Insufficient documentation

## 2024-03-25 MED ORDER — ESTRADIOL 0.1 MG/GM VA CREA
0.5000 g | TOPICAL_CREAM | VAGINAL | 1 refills | Status: AC
  Filled 2024-03-25: qty 42.5, 100d supply, fill #0

## 2024-03-25 NOTE — Patient Instructions (Signed)

## 2024-03-29 ENCOUNTER — Ambulatory Visit: Payer: Self-pay | Admitting: Obstetrics and Gynecology

## 2024-03-29 DIAGNOSIS — R87610 Atypical squamous cells of undetermined significance on cytologic smear of cervix (ASC-US): Secondary | ICD-10-CM

## 2024-03-29 LAB — CYTOLOGY - PAP
Comment: NEGATIVE
Diagnosis: UNDETERMINED — AB
High risk HPV: POSITIVE — AB

## 2024-03-29 NOTE — Progress Notes (Signed)
 Please confirm patient has received this message and facilitate colposcopy with me.  I have placed or order for the colposcopy.  Hi Rebecca Terry,   Your pap shows atypical squamous cells (very minor abnormal cervical cell change), and your HPV test is positive.   I recommend a colposcopy.   I will have the office reach out to you to facilitate the appointment with me.   It would be good to stop using your vaginal estrogen cream about 4 days prior to the colposcopy.   Have a good evening.  Jonel Nephew, MD

## 2024-03-30 ENCOUNTER — Other Ambulatory Visit (HOSPITAL_COMMUNITY): Payer: Self-pay

## 2024-03-31 ENCOUNTER — Other Ambulatory Visit: Payer: Self-pay | Admitting: Obstetrics and Gynecology

## 2024-05-05 ENCOUNTER — Other Ambulatory Visit: Payer: Self-pay | Admitting: Student

## 2024-05-05 ENCOUNTER — Other Ambulatory Visit (HOSPITAL_COMMUNITY): Payer: Self-pay

## 2024-05-05 MED ORDER — ZOLPIDEM TARTRATE 5 MG PO TABS
ORAL_TABLET | ORAL | 0 refills | Status: DC
  Filled 2024-05-05: qty 15, 15d supply, fill #0

## 2024-05-06 ENCOUNTER — Other Ambulatory Visit (HOSPITAL_COMMUNITY): Payer: Self-pay

## 2024-05-06 MED ORDER — ROSUVASTATIN CALCIUM 10 MG PO TABS
10.0000 mg | ORAL_TABLET | Freq: Every day | ORAL | 3 refills | Status: AC
  Filled 2024-05-06: qty 90, 90d supply, fill #0
  Filled 2024-10-19: qty 90, 90d supply, fill #1

## 2024-05-07 ENCOUNTER — Other Ambulatory Visit (HOSPITAL_COMMUNITY): Payer: Self-pay

## 2024-05-11 ENCOUNTER — Other Ambulatory Visit (HOSPITAL_COMMUNITY): Payer: Self-pay

## 2024-05-11 DIAGNOSIS — M25361 Other instability, right knee: Secondary | ICD-10-CM | POA: Diagnosis not present

## 2024-05-11 DIAGNOSIS — M25561 Pain in right knee: Secondary | ICD-10-CM | POA: Diagnosis not present

## 2024-05-11 MED ORDER — MELOXICAM 7.5 MG PO TABS
7.5000 mg | ORAL_TABLET | Freq: Two times a day (BID) | ORAL | 0 refills | Status: DC
  Filled 2024-05-11: qty 28, 14d supply, fill #0

## 2024-05-18 DIAGNOSIS — G4733 Obstructive sleep apnea (adult) (pediatric): Secondary | ICD-10-CM | POA: Diagnosis not present

## 2024-05-24 NOTE — Progress Notes (Unsigned)
 GYNECOLOGY  VISIT   HPI: 73 y.o.   Single  Caucasian female   [phone] with Patient's last menstrual period was 10/21/1992.   here for: Colposcopy for pap ASCUS, positive HR HPV.    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   GYNECOLOGIC HISTORY: Patient's last menstrual period was 10/21/1992. Contraception:  PMP Menopausal hormone therapy:  Estrace   Last 2 paps:  03/25/24 ASCUS HR HPV +, 02/11/23 HR HPV + History of abnormal Pap or positive HPV:  yes Mammogram:  09/23/23 Breast Density Cat B, BIRADS Cat 1 neg         OB History     Gravida  4   Para  1   Term  1   Preterm      AB  2   Living  1      SAB      IAB  1   Ectopic  1   Multiple      Live Births                 Patient Active Problem List   Diagnosis Date Noted   Posterior tibial tendon dysfunction (PTTD) of both lower extremities 12/31/2021   Snoring 08/15/2021   Primary localized osteoarthritis of left knee 11/27/2015   Gilbert's syndrome    Herpes genitalis in women    Hypertriglyceridemia 08/24/2015   History of MRSA infection 07/22/2015   Aortic valve disorder    Essential hypertension, benign    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   Osteoarthritis of neck 10/28/2013   Pain in joint, shoulder region 10/13/2013   Hammertoe 10/13/2013   LATERAL EPICONDYLITIS, RIGHT 10/17/2008   ARM PAIN, RIGHT 10/17/2008    Past Medical History:  Diagnosis Date   Abnormal Pap smear of cervix    09/2013 Ascus:Pos HR HPV, 03/2014 Ascus:Pos HR HPV;colpo 05/2014 LGSIL of cx and VIN 1of perianal region, 08-09-16 neg HPV HR +   Aortic valve disorders    Arthritis    Diverticulosis    Elevated bilirubin 08/09/2016   level 1.4   Essential hypertension, benign    Fibroid    posterior   Herpes genitalis in women    History of MRSA infection 07/2015   culture positive [facility]   Hyperlipidemia    Hypertension    Osteopenia 2018   hip and spine   Primary localized  osteoarthritis of left knee    Rectocele    Sleep apnea    Urinary incontinence     Past Surgical History:  Procedure Laterality Date   arthroscopic knee Left 2001   BILATERAL OOPHORECTOMY  2010   Dr. Nikki ALES OSTEOTOMY Right 06/20/2022   Procedure: CALCANEAL OSTEOTOMY;  Surgeon: Kit Rush, MD;  Location: [facility];  Service: Orthopedics;  Laterality: Right;   CARPAL TUNNEL RELEASE Bilateral    COLONOSCOPY     DIAGNOSTIC LAPAROSCOPY     GASTROCNEMIUS RECESSION Right 06/20/2022   Procedure: GASTROCNEMIUS RECESSION;  Surgeon: Kit Rush, MD;  Location: [facility];  Service: Orthopedics;  Laterality: Right;   PELVIC LAPAROSCOPY     x2--ectopics   TOTAL KNEE ARTHROPLASTY Left 12/11/2015   Procedure: LEFT TOTAL KNEE ARTHROPLASTY;  Surgeon: Lamar Millman, MD;  Location: [facility];  Service: Orthopedics;  Laterality: Left;    Current Outpatient Medications  Medication Sig Dispense Refill   clobetasol  ointment (TEMOVATE ) 0.05 % Apply externally Twice a day for 14 days  30 g 0   estradiol  (ESTRACE ) 0.1 MG/GM vaginal cream Place 0.5 g vaginally 3 (three) times a week. Patient will call if she needs a refill. 42.5 g 1   losartan -hydrochlorothiazide  (HYZAAR ) 100-25 MG tablet Take 1 tablet by mouth daily. 90 tablet 1   Magnesium 400 MG TABS Take 1 tablet by mouth daily.     metoprolol  succinate (TOPROL -XL) 25 MG 24 hr tablet Take 1 tablet (25 mg total) by mouth daily. 90 tablet 1   Multiple Vitamin (MULTIVITAMIN) tablet Take 1 tablet by mouth daily.     rosuvastatin  (CRESTOR ) 10 MG tablet Take 1 tablet (10 mg total) by mouth daily. 90 tablet 3   TURMERIC PO Take 1 tablet by mouth daily.     zolpidem  (AMBIEN ) 5 MG tablet Take 1-2 tablets (5-10 mg total) by mouth at bedtime. 15 tablet 0   No current facility-administered medications for this visit.     ALLERGIES: Hibiclens [chlorhexidine  gluconate], Lisinopril, Latex, Nickel, and Penicillins  Family  History  Problem Relation Age of Onset   Asthma Mother    Congestive Heart Failure Father    Multiple sclerosis Father    Heart attack Father    BRCA 1/2 Neg Hx    Breast cancer Neg Hx     Social History   Socioeconomic History   Marital status: Single    Spouse name: Not on file   Number of children: Not on file   Years of education: Not on file   Highest education level: Not on file  Occupational History   Not on file  Tobacco Use   Smoking status: Former    Current packs/day: 0.00    Average packs/day: 0.5 packs/day for 20.0 years (10.0 ttl pk-yrs)    Types: Cigarettes    Start date: 04/07/1973    Quit date: 04/07/1993    Years since quitting: 31.1   Smokeless tobacco: Never  Vaping Use   Vaping status: Never Used  Substance and Sexual Activity   Alcohol use: Not Currently    Alcohol/week: 5.0 standard drinks of alcohol    Types: 5 Standard drinks or equivalent per week    Comment: 5 glasses wine/beer per week   Drug use: No   Sexual activity: Not Currently    Partners: Male    Birth control/protection: Post-menopausal  Other Topics Concern   Not on file  Social History Narrative   Not on file   Social Drivers of Health   Financial Resource Strain: Not on file  Food Insecurity: Not on file  Transportation Needs: Not on file  Physical Activity: Not on file  Stress: Not on file  Social Connections: Not on file  Intimate Partner Violence: Not on file    Review of Systems  All other systems reviewed and are negative.   PHYSICAL EXAMINATION:   BP 126/80 (BP Location: Left Arm, Patient Position: Sitting)   Pulse (!) 57   LMP 10/21/1992   SpO2 97%     General appearance: alert, cooperative and appears stated age  Pelvic: External genitalia:  no lesions              Urethra:  normal appearing urethra with no masses, tenderness or lesions              Bartholins and Skenes: normal                 Vagina: normal appearing vagina with normal color and  discharge, no lesions  Cervix: no lesions                Bimanual Exam:  Uterus:  normal size, contour, position, consistency, mobility, non-tender              Adnexa: no mass, fullness, tenderness         Colposcopy - cervix, vagina, and vulva.  Consent for procedure.  Time out done for specimen. 3% acetic acid used in vagina and on vulva. White light and green light filter used.  Colposcopy satisfactory:  Yes   _____          No    _x____ Findings:    Cervix:  no lesions of cervix or vagina.   Cervix somewhat shortened.  Difficulty to maintain Graves speculum.  Vulva:   salt and pepper areas of flat HPV change of vulva and perianal region.  Biopsies:   ECC to pathology. Minimal EBL. No complications.    Chaperone was present for exam:  Kari HERO, CMA  ASSESSMENT:  ASCUS pap and positive HR HPV.  HPV changes to the vulva.  No discrete thickened lesions.  Hx CIN I and VIN I.  PLAN:  Follow up ECC.  Anticipate doing pap and HR HPV testing in 1 year.  LEEP is an option, but the cervix is short and a speculum is not well maintained in the vagina, which may necessitate a procedure in an outpatient surgical setting if this were needed.

## 2024-05-25 ENCOUNTER — Encounter: Payer: Self-pay | Admitting: Obstetrics and Gynecology

## 2024-05-25 ENCOUNTER — Other Ambulatory Visit (HOSPITAL_COMMUNITY): Payer: Self-pay

## 2024-05-25 ENCOUNTER — Other Ambulatory Visit (HOSPITAL_COMMUNITY)
Admission: RE | Admit: 2024-05-25 | Discharge: 2024-05-25 | Disposition: A | Discharge status: Home / Self Care | Source: Ambulatory Visit | Attending: Obstetrics and Gynecology | Admitting: Obstetrics and Gynecology

## 2024-05-25 ENCOUNTER — Ambulatory Visit (INDEPENDENT_AMBULATORY_CARE_PROVIDER_SITE_OTHER): Admitting: Obstetrics and Gynecology

## 2024-05-25 DIAGNOSIS — R87810 Cervical high risk human papillomavirus (HPV) DNA test positive: Secondary | ICD-10-CM

## 2024-05-25 DIAGNOSIS — R87610 Atypical squamous cells of undetermined significance on cytologic smear of cervix (ASC-US): Secondary | ICD-10-CM | POA: Diagnosis not present

## 2024-05-25 DIAGNOSIS — N870 Mild cervical dysplasia: Secondary | ICD-10-CM | POA: Diagnosis not present

## 2024-05-25 NOTE — Patient Instructions (Signed)
 Colposcopy, Care After  The following information offers guidance on how to care for yourself after your procedure. Your health care provider may also give you more specific instructions. If you have problems or questions, contact your health care provider. What can I expect after the procedure? If you had a colposcopy without a biopsy, you can expect to feel fine right away after your procedure. However, you may have some spotting of blood for a few days. You can return to your normal activities. If you had a colposcopy with a biopsy, it is common after the procedure to have: Soreness and mild pain. These may last for a few days. Mild vaginal bleeding or discharge that is dark-colored and grainy. This may last for a few days. The discharge may be caused by a liquid (solution) that was used during the procedure. You may need to wear a sanitary pad during this time. Spotting of blood for at least 48 hours after the procedure. Follow these instructions at home: Medicines Take over-the-counter and prescription medicines only as told by your health care provider. Talk with your health care provider about what type of over-the-counter pain medicines and prescription medicines you can start to take again. It is especially important to talk with your health care provider if you take blood thinners. Activity Avoid using douche products, using tampons, and having sex for at least 3 days after the procedure or for as long as told by your health care provider. Return to your normal activities as told by your health care provider. Ask your health care provider what activities are safe for you. General instructions Ask your health care provider if you may take baths, swim, or use a hot tub. You may take showers. If you use birth control (contraception), continue to use it. Keep all follow-up visits. This is important. Contact a health care provider if: You have a fever or chills. You faint or feel  light-headed. Get help right away if: You have heavy bleeding from your vagina or pass blood clots. Heavy bleeding is bleeding that soaks through a sanitary pad in less than 1 hour. You have vaginal discharge that is abnormal, is yellow in color, or smells bad. This could be a sign of infection. You have severe pain or cramps in your lower abdomen that do not go away with medicine. Summary If you had a colposcopy without a biopsy, you can expect to feel fine right away, but you may have some spotting of blood for a few days. You can return to your normal activities. If you had a colposcopy with a biopsy, it is common to have mild pain for a few days and spotting for 48 hours after the procedure. Avoid using douche products, using tampons, and having sex for at least 3 days after the procedure or for as long as told by your health care provider. Get help right away if you have heavy bleeding, severe pain, or signs of infection. This information is not intended to replace advice given to you by your health care provider. Make sure you discuss any questions you have with your health care provider. Document Revised: 03/04/2021 Document Reviewed: 03/04/2021 Elsevier Patient Education  2024 [employer].

## 2024-05-29 ENCOUNTER — Ambulatory Visit: Payer: Self-pay | Admitting: Obstetrics and Gynecology

## 2024-06-01 ENCOUNTER — Encounter: Payer: Self-pay | Admitting: Obstetrics and Gynecology

## 2024-06-02 NOTE — Telephone Encounter (Signed)
 05/25/24 pathology reviewed.   Routing to Dr. Nikki for f/u

## 2024-06-08 ENCOUNTER — Other Ambulatory Visit (HOSPITAL_COMMUNITY): Payer: Self-pay

## 2024-06-08 DIAGNOSIS — M1711 Unilateral primary osteoarthritis, right knee: Secondary | ICD-10-CM | POA: Diagnosis not present

## 2024-06-08 LAB — SURGICAL PATHOLOGY

## 2024-06-08 MED ORDER — MELOXICAM 7.5 MG PO TABS
7.5000 mg | ORAL_TABLET | Freq: Two times a day (BID) | ORAL | 0 refills | Status: AC
  Filled 2024-06-08: qty 60, 30d supply, fill #0

## 2024-06-09 ENCOUNTER — Other Ambulatory Visit (HOSPITAL_COMMUNITY): Payer: Self-pay

## 2024-06-09 NOTE — Telephone Encounter (Signed)
Pap recall placed.  

## 2024-07-16 DIAGNOSIS — S99922A Unspecified injury of left foot, initial encounter: Secondary | ICD-10-CM | POA: Diagnosis not present

## 2024-07-30 ENCOUNTER — Other Ambulatory Visit (HOSPITAL_COMMUNITY): Payer: Self-pay

## 2024-07-30 DIAGNOSIS — R42 Dizziness and giddiness: Secondary | ICD-10-CM | POA: Diagnosis not present

## 2024-07-30 DIAGNOSIS — Z23 Encounter for immunization: Secondary | ICD-10-CM | POA: Diagnosis not present

## 2024-07-30 DIAGNOSIS — I351 Nonrheumatic aortic (valve) insufficiency: Secondary | ICD-10-CM | POA: Diagnosis not present

## 2024-07-30 DIAGNOSIS — E785 Hyperlipidemia, unspecified: Secondary | ICD-10-CM | POA: Diagnosis not present

## 2024-07-30 DIAGNOSIS — I10 Essential (primary) hypertension: Secondary | ICD-10-CM | POA: Diagnosis not present

## 2024-07-30 MED ORDER — METOPROLOL SUCCINATE ER 25 MG PO TB24
25.0000 mg | ORAL_TABLET | Freq: Every day | ORAL | 1 refills | Status: AC
  Filled 2024-07-30 – 2024-08-20 (×3): qty 90, 90d supply, fill #0
  Filled 2024-11-17: qty 90, 90d supply, fill #1

## 2024-07-30 MED ORDER — LOSARTAN POTASSIUM-HCTZ 100-25 MG PO TABS
1.0000 | ORAL_TABLET | Freq: Every day | ORAL | 1 refills | Status: AC
  Filled 2024-07-30 – 2024-09-06 (×2): qty 90, 90d supply, fill #0

## 2024-08-02 ENCOUNTER — Other Ambulatory Visit (HOSPITAL_COMMUNITY): Payer: Self-pay

## 2024-08-03 ENCOUNTER — Other Ambulatory Visit (HOSPITAL_COMMUNITY): Payer: Self-pay

## 2024-08-03 DIAGNOSIS — H00012 Hordeolum externum right lower eyelid: Secondary | ICD-10-CM | POA: Diagnosis not present

## 2024-08-03 MED ORDER — DOXYCYCLINE HYCLATE 100 MG PO TABS
100.0000 mg | ORAL_TABLET | Freq: Every day | ORAL | 0 refills | Status: AC
  Filled 2024-08-03: qty 8, 8d supply, fill #0

## 2024-08-03 MED ORDER — COVID-19 MRNA VAC-TRIS(PFIZER) 30 MCG/0.3ML IM SUSY
0.3000 mL | PREFILLED_SYRINGE | INTRAMUSCULAR | 0 refills | Status: AC
  Filled 2024-08-03: qty 0.3, 1d supply, fill #0

## 2024-08-09 ENCOUNTER — Other Ambulatory Visit: Payer: Self-pay | Admitting: Family Medicine

## 2024-08-09 DIAGNOSIS — Z1231 Encounter for screening mammogram for malignant neoplasm of breast: Secondary | ICD-10-CM

## 2024-08-10 ENCOUNTER — Telehealth (INDEPENDENT_AMBULATORY_CARE_PROVIDER_SITE_OTHER): Payer: Self-pay

## 2024-08-10 NOTE — Telephone Encounter (Signed)
 Called and spoke with Pt about apt next week

## 2024-08-10 NOTE — Telephone Encounter (Signed)
 Talked to Pt about getting her in next week on Thursday at 8am with masciello instead of waiting until December Pt was happy and accepted the APT.

## 2024-08-19 ENCOUNTER — Telehealth (INDEPENDENT_AMBULATORY_CARE_PROVIDER_SITE_OTHER): Payer: Self-pay

## 2024-08-19 ENCOUNTER — Encounter (INDEPENDENT_AMBULATORY_CARE_PROVIDER_SITE_OTHER): Payer: Self-pay

## 2024-08-19 ENCOUNTER — Ambulatory Visit (INDEPENDENT_AMBULATORY_CARE_PROVIDER_SITE_OTHER)

## 2024-08-19 VITALS — BP 122/78 | HR 59

## 2024-08-19 DIAGNOSIS — H8112 Benign paroxysmal vertigo, left ear: Secondary | ICD-10-CM

## 2024-08-19 DIAGNOSIS — R42 Dizziness and giddiness: Secondary | ICD-10-CM

## 2024-08-19 NOTE — Progress Notes (Addendum)
 Dear Dr. Austin, Here is my assessment for our mutual patient, Rebecca Terry. Thank you for allowing me the opportunity to care for your patient. Please do not hesitate to contact me should you have any other questions. Sincerely, Dr. Hadassah Parody  Otolaryngology Clinic Note Referring provider: Dr. Austin HPI:   Initial HPI (08/19/2024) Rebecca Terry is a 73 y.o. female with a history of benign paroxysmal positional vertigo who presents for evaluation of recent episode of vertigo. History of benign paroxysmal positional vertigo for many years. Does home Epley maneuvers and typically resolves. During last episode performed Epley maneuver multiple times daily for five days, then took a break. Persistent vertigo for approximately two and a half weeks. Did not seem to resolve. She was finally able to get the episode to resolve. Seemed to be on the left side and seems like prior episodes of BPPV. Episodes last seconds to under a minute.   No associated hearing loss, tinnitus, or aural fullness.   Independent Review of Additional Tests or Records:  ENT referral 08/02/24 Una Austin, MD): persistent vertigo despite Epley maneuvers    PMH/Meds/All/SocHx/FamHx/ROS:   Past Medical History:  Diagnosis Date   Abnormal Pap smear of cervix    09/2013 Ascus:Pos HR HPV, 03/2014 Ascus:Pos HR HPV;colpo 05/2014 LGSIL of cx and VIN 1of perianal region, 08-09-16 neg HPV HR +   Aortic valve disorders    Arthritis    Diverticulosis    Elevated bilirubin 08/09/2016   level 1.4   Essential hypertension, benign    Fibroid    posterior   Herpes genitalis in women    History of MRSA infection 07/2015   culture positive [facility]   Hyperlipidemia    Hypertension    Osteopenia 2018   hip and spine   Primary localized osteoarthritis of left knee    Rectocele    Sleep apnea    Urinary incontinence      Past Surgical History:  Procedure Laterality Date   arthroscopic knee Left 2001   BILATERAL  OOPHORECTOMY  2010   Dr. Nikki ALES OSTEOTOMY Right 06/20/2022   Procedure: CALCANEAL OSTEOTOMY;  Surgeon: Kit Rush, MD;  Location: [facility];  Service: Orthopedics;  Laterality: Right;   CARPAL TUNNEL RELEASE Bilateral    COLONOSCOPY     DIAGNOSTIC LAPAROSCOPY     GASTROCNEMIUS RECESSION Right 06/20/2022   Procedure: GASTROCNEMIUS RECESSION;  Surgeon: Kit Rush, MD;  Location: [facility];  Service: Orthopedics;  Laterality: Right;   PELVIC LAPAROSCOPY     x2--ectopics   TOTAL KNEE ARTHROPLASTY Left 12/11/2015   Procedure: LEFT TOTAL KNEE ARTHROPLASTY;  Surgeon: Lamar Millman, MD;  Location: [facility] OR;  Service: Orthopedics;  Laterality: Left;    Family History  Problem Relation Age of Onset   Asthma Mother    Congestive Heart Failure Father    Multiple sclerosis Father    Heart attack Father    BRCA 1/2 Neg Hx    Breast cancer Neg Hx      Social Connections: Not on file     Current Outpatient Medications  Medication Instructions   clobetasol  ointment (TEMOVATE ) 0.05 % Apply externally Twice a day for 14 days   COVID-19 mRNA vaccine, Pfizer, (COMIRNATY) syringe 0.3 mLs, Intramuscular   doxycycline (VIBRA-TABS) 100 mg, Oral, Daily   estradiol  (ESTRACE ) 0.5 g, Vaginal, 3 times weekly, Patient will call if she needs a refill.   losartan -hydrochlorothiazide  (HYZAAR ) 100-25 MG tablet 1 tablet, Oral,  Daily   Magnesium 400 MG TABS 1 tablet, Daily   meloxicam  (MOBIC ) 7.5 MG tablet Take 1 tablet (7.5 mg total) by mouth 2 (two) times daily with meals for pain and inflammation.   metoprolol  succinate (TOPROL -XL) 25 mg, Oral, Daily   Multiple Vitamin (MULTIVITAMIN) tablet 1 tablet, Daily   rosuvastatin  (CRESTOR ) 10 mg, Oral, Daily   TURMERIC PO 1 tablet, Daily   zolpidem  (AMBIEN ) 5-10 mg, Oral, Daily at bedtime     Physical Exam:   BP 122/78 (BP Location: Right Arm, Patient Position: Sitting)   Pulse (!) 59   LMP 10/21/1992   SpO2 95%    Salient findings:  CN II-XII intact Bilateral EAC clear and TM intact with well pneumatized middle ear spaces No lesions of oral cavity/oropharynx No obviously palpable neck masses/lymphadenopathy/thyromegaly No respiratory distress or stridor  Seprately Identifiable Procedures:  Ear microscopy performed bilaterally and findings noted above.     Impression & Plans:  Rebecca Terry is a 73 y.o. female with   1. BPPV (benign paroxysmal positional vertigo), left   2. Vertigo    Assessment and Plan Assessment & Plan Benign paroxysmal positional vertigo, left ear Chronic BPPV with recent exacerbation mostly resolved with Epley maneuvers but took much longer than normal to resolve. Feels mostly better at this point and interested in vestibular eval  - Refer to vestibular rehabilitation for further evaluation and management if symptoms recur.   See below regarding exact medications prescribed this encounter including dosages and route: No orders of the defined types were placed in this encounter.   Thank you for allowing me the opportunity to care for your patient. Please do not hesitate to contact me should you have any other questions.  Sincerely, Hadassah Parody, MD Otolaryngologist (ENT), [facility] Phone: [phone] Fax: [phone]

## 2024-08-19 NOTE — Telephone Encounter (Signed)
 LVM thanking Dr. Masciello for changing what was in her chart.

## 2024-08-19 NOTE — Addendum Note (Signed)
 Addended by: Cleota Pellerito on: 08/19/2024 10:16 AM   Modules accepted: Level of Service

## 2024-08-20 ENCOUNTER — Other Ambulatory Visit (HOSPITAL_COMMUNITY): Payer: Self-pay

## 2024-08-20 ENCOUNTER — Other Ambulatory Visit: Payer: Self-pay

## 2024-08-20 MED ORDER — ESTRADIOL 0.01 % VA CREA
0.5000 g | TOPICAL_CREAM | VAGINAL | 0 refills | Status: AC
  Filled 2024-08-20: qty 42.5, 90d supply, fill #0
  Filled 2024-08-20: qty 42.5, 100d supply, fill #0

## 2024-08-24 ENCOUNTER — Ambulatory Visit: Admitting: Physical Therapy

## 2024-08-24 DIAGNOSIS — H8112 Benign paroxysmal vertigo, left ear: Secondary | ICD-10-CM | POA: Diagnosis not present

## 2024-08-24 NOTE — Therapy (Unsigned)
 OUTPATIENT PHYSICAL THERAPY VESTIBULAR EVALUATION     Patient Name: Rebecca Terry MRN: [id number] DOB:[birth year], 73 y.o., female Today's Date: 08/25/2024  END OF SESSION:  PT End of Session - 08/25/24 0835     Visit Number 1    Number of Visits 1    Authorization Type [employer]    Authorization Time Period 08-24-24 - 10-20-24    PT Start Time 0847    PT Stop Time 0930    PT Time Calculation (min) 43 min    Activity Tolerance Patient tolerated treatment well    Behavior During Therapy [facility] for tasks assessed/performed          Past Medical History:  Diagnosis Date   Abnormal Pap smear of cervix    09/2013 Ascus:Pos HR HPV, 03/2014 Ascus:Pos HR HPV;colpo 05/2014 LGSIL of cx and VIN 1of perianal region, 08-09-16 neg HPV HR +   Aortic valve disorders    Arthritis    Diverticulosis    Elevated bilirubin 08/09/2016   level 1.4   Essential hypertension, benign    Fibroid    posterior   Herpes genitalis in women    History of MRSA infection 07/2015   culture positive [facility]   Hyperlipidemia    Hypertension    Osteopenia 2018   hip and spine   Primary localized osteoarthritis of left knee    Rectocele    Sleep apnea    Urinary incontinence    Past Surgical History:  Procedure Laterality Date   arthroscopic knee Left 2001   BILATERAL OOPHORECTOMY  2010   Dr. Nikki ALES OSTEOTOMY Right 06/20/2022   Procedure: CALCANEAL OSTEOTOMY;  Surgeon: Kit Rush, MD;  Location: [facility];  Service: Orthopedics;  Laterality: Right;   CARPAL TUNNEL RELEASE Bilateral    COLONOSCOPY     DIAGNOSTIC LAPAROSCOPY     GASTROCNEMIUS RECESSION Right 06/20/2022   Procedure: GASTROCNEMIUS RECESSION;  Surgeon: Kit Rush, MD;  Location: [facility];  Service: Orthopedics;  Laterality: Right;   PELVIC LAPAROSCOPY     x2--ectopics   TOTAL KNEE ARTHROPLASTY Left 12/11/2015   Procedure: LEFT TOTAL KNEE ARTHROPLASTY;  Surgeon: Lamar Millman,  MD;  Location: [facility] OR;  Service: Orthopedics;  Laterality: Left;   Patient Active Problem List   Diagnosis Date Noted   Posterior tibial tendon dysfunction (PTTD) of both lower extremities 12/31/2021   Snoring 08/15/2021   Primary localized osteoarthritis of left knee 11/27/2015   Gilbert's syndrome    Herpes genitalis in women    Hypertriglyceridemia 08/24/2015   History of MRSA infection 07/22/2015   Aortic valve disorder    Essential hypertension, benign    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 Sept. 2025 for most recent episode of BPPV:  Referral date   Rationale for Evaluation and Treatment: Rehabilitation  SUBJECTIVE:   SUBJECTIVE STATEMENT: Pt reports she has chronic vertigo; has been able to do the Epley maneuver for self treatment and achieve resolution but states the most recent episode of BPPV did not resolve as  easily with treatment - was more persistent.  Pt reports it took a while to get this appt -says she has done the maneuver and is no longer experiencing vertigo. Wants to learn any additional techniques or maneuvers, if there are any additional treatment options/maneuvers.  Pt accompanied by: self  PERTINENT HISTORY: Rebecca Terry is a 73 y.o. female with a history of benign paroxysmal positional vertigo who presents for evaluation of recent episode of vertigo. History of benign paroxysmal positional vertigo for many years. Does home Epley maneuvers and typically resolves. During last episode performed  Epley maneuver multiple times daily for five days, then took a break. Persistent vertigo for approximately two and a half weeks. Did not seem to resolve. She was finally able to get the episode to resolve. Seemed to be on the left side and seems like prior episodes of BPPV. Episodes last seconds to under a minute.   PAIN:  Are you having pain? No  PRECAUTIONS: None  RED FLAGS: None   WEIGHT BEARING RESTRICTIONS: No  FALLS: Has patient fallen in last 6 months? No  PLOF: Independent  PATIENT GOALS: to see if I am the doing Epley correctly and to learn any other maneuvers for treatment if any more are available  OBJECTIVE:  Note: Objective measures were completed at Evaluation unless otherwise noted.  DIAGNOSTIC FINDINGS: N/A    GAIT: Gait pattern: WFL Level of assistance: Complete Independence    POSITIONAL TESTING: Right Sidelying: no nystagmus and no nystagmus and no c/o vertigo Left Sidelying: no nystagmus and no c/o vertigo  Dix-Hallpike tests not performed due to no c/o vertigo at this time  MOTION SENSITIVITY:  Motion Sensitivity Quotient Intensity: 0 = none, 1 = Lightheaded, 2 = Mild, 3 = Moderate, 4 = Severe, 5 = Vomiting  Intensity  1. Sitting to supine   2. Supine to L side   3. Supine to R side   4. Supine to sitting   5. L Hallpike-Dix   6. Up from L    7. R Hallpike-Dix   8. Up from R    9. Sitting, head tipped to L knee   10. Head up from L knee   11. Sitting, head tipped to R knee   12. Head up from R knee   13. Sitting head turns x5   14.Sitting head nods x5   15. In stance, 180 turn to L    16. In stance, 180 turn to R                                                                                                                               TREATMENT DATE: 08-24-24  Self Care - see below; Reviewed Epley maneuver with pt for understanding of correct positioning, technique, etc.;  Instructed pt in Semont maneuver with handout  given   PATIENT EDUCATION: Education details: article from [employer] on etiology of BPPV given to pt; also gave  handout on Semont maneuver for another optional treatment maneuver for BPPV;  Person educated: Patient Education method: Explanation, Demonstration, and Handouts Education comprehension: verbalized understanding and returned demonstration  HOME EXERCISE PROGRAM:  no HEP needed - pt verbalizes understanding of canalith repositioning maneuvers - see above  GOALS: Goals reviewed with patient? N/A - eval only  SHORT TERM GOALS: N/A   LONG TERM GOALS: Target date: N/A  ASSESSMENT:  CLINICAL IMPRESSION: Patient is a 73 y.o. lady who was seen today for physical therapy evaluation and treatment for education in canalith repositioning maneuvers due to chronic episodic BPPV.  Pt reports no vertigo at this time, as she has effectively performed self treatment with Epley maneuver.  Technique, positioning and duration in each position of Epley maneuver was reviewed with pt; pt was instructed in Semont maneuver for another treatment option for canalith repositioning if needed, should she have another recurrent episode in the future.  No follow up needed as pt does not have vertigo at this time; pt's goal was to learn additional techniques for treatment of BPPV and to ensure that she was doing the Epley maneuer correctly.  Pt was informed that she could return to PT if needed in the future if she had a recurrent episode of BPPV.    REHAB POTENTIAL: N/A - eval only  CLINICAL DECISION MAKING: Stable/uncomplicated  EVALUATION COMPLEXITY: Low   PLAN:  PT FREQUENCY: one time visit  PT DURATION: 1 week  PLANNED INTERVENTIONS: [id number]- Self Care and initial evaluation  PLAN FOR NEXT SESSION: N/A - no follow up needed at this time   Delaine Hernandez Suzanne, PT 08/25/2024, 8:37 AM

## 2024-08-25 ENCOUNTER — Encounter: Payer: Self-pay | Admitting: Physical Therapy

## 2024-08-25 ENCOUNTER — Ambulatory Visit

## 2024-08-25 ENCOUNTER — Telehealth: Payer: Self-pay | Admitting: Physician Assistant

## 2024-08-25 NOTE — Telephone Encounter (Signed)
 STAT if HR is under 50 or over 120 (normal HR is 60-100 beats per minute)  What is your heart rate? 60  Do you have a log of your heart rate readings (document readings)? Pt has been keeping an eye on readings and usually runs into the high 50's low-mid 60's.   Do you have any other symptoms? No    Pt is concerned with low heart rates she has been having. She would like to speak with someone about her metoprolol  and rosuvastatin . Please advise.

## 2024-08-25 NOTE — Telephone Encounter (Signed)
 Call to patient to discuss concerns. She reports she has lost 13 lbs recently and happened to notice that her BP was trending a little lower, as was her HR. She denies any lightheadedness, fatigue but is wondering if her medications should be adjusted. She says she takes all her medications, including toprol  and hyzaar  at night. She often checks her HR first thing in the morning and is concerned it is sometimes as low as 50. Unfortunately she does not write down her BP or HR readings.   Advised I would forward her concerns to [place] as she is asymptomatic. She stated she will try to write down her BP/HR for a week.

## 2024-08-25 NOTE — Telephone Encounter (Signed)
 Yes, we may need to reduce one or both. Let's have her check BP and HR daily and send to me - she can send via MyChart. Glendia Ferrier, PA-C    08/25/2024 5:01 PM

## 2024-09-06 ENCOUNTER — Other Ambulatory Visit (HOSPITAL_COMMUNITY): Payer: Self-pay

## 2024-09-06 ENCOUNTER — Other Ambulatory Visit: Payer: Self-pay

## 2024-09-06 MED ORDER — ZOLPIDEM TARTRATE 5 MG PO TABS
ORAL_TABLET | ORAL | 0 refills | Status: AC
  Filled 2024-09-06: qty 15, 15d supply, fill #0

## 2024-09-07 DIAGNOSIS — H524 Presbyopia: Secondary | ICD-10-CM | POA: Diagnosis not present

## 2024-09-07 DIAGNOSIS — H2513 Age-related nuclear cataract, bilateral: Secondary | ICD-10-CM | POA: Diagnosis not present

## 2024-09-13 DIAGNOSIS — L659 Nonscarring hair loss, unspecified: Secondary | ICD-10-CM | POA: Diagnosis not present

## 2024-09-13 DIAGNOSIS — I10 Essential (primary) hypertension: Secondary | ICD-10-CM | POA: Diagnosis not present

## 2024-09-13 DIAGNOSIS — E559 Vitamin D deficiency, unspecified: Secondary | ICD-10-CM | POA: Diagnosis not present

## 2024-09-23 ENCOUNTER — Ambulatory Visit
Admission: RE | Admit: 2024-09-23 | Discharge: 2024-09-23 | Disposition: A | Source: Ambulatory Visit | Attending: Family Medicine | Admitting: Family Medicine

## 2024-09-23 DIAGNOSIS — Z1231 Encounter for screening mammogram for malignant neoplasm of breast: Secondary | ICD-10-CM

## 2024-09-24 ENCOUNTER — Ambulatory Visit

## 2024-10-12 ENCOUNTER — Ambulatory Visit: Admitting: Physical Therapy

## 2024-10-12 ENCOUNTER — Encounter: Payer: Self-pay | Admitting: Physical Therapy

## 2024-10-12 DIAGNOSIS — H8112 Benign paroxysmal vertigo, left ear: Secondary | ICD-10-CM | POA: Diagnosis present

## 2024-10-12 NOTE — Patient Instructions (Signed)
Self Treatment for Left Posterior / Anterior Canalithiasis    Sitting on bed: 1. Turn head 45 left. (a) Lie back slowly, shoulders on pillow, head on bed. (b) Hold __20__ seconds. 2. Keeping head on bed, turn head 90 right. Hold __20_ seconds. 3. Roll to right, head on 45 angle down toward bed. Hold __20__ seconds. 4. Sit up on right side of bed. Repeat ___3_ times per session. Do _2___ sessions per day.  Copyright  [employer]. All rights reserved.   

## 2024-10-12 NOTE — Therapy (Signed)
 " OUTPATIENT PHYSICAL THERAPY VESTIBULAR TREATMENT NOTE     Patient Name: LATAUNYA RUUD MRN: [id number] DOB:April 16, 1951, 73 y.o., female Today's Date: 10/12/2024  END OF SESSION:  PT End of Session - 10/12/24 2020     Visit Number 1    Number of Visits 2    Date for Recertification  11/05/24    Authorization Type HTA Medicare    Authorization Time Period 08-24-24 - 10-20-24    PT Start Time 0940    PT Stop Time 1020    PT Time Calculation (min) 40 min    Activity Tolerance Patient tolerated treatment well    Behavior During Therapy [facility] for tasks assessed/performed           Past Medical History:  Diagnosis Date   Abnormal Pap smear of cervix    09/2013 Ascus:Pos HR HPV, 03/2014 Ascus:Pos HR HPV;colpo 05/2014 LGSIL of cx and VIN 1of perianal region, 08-09-16 neg HPV HR +   Aortic valve disorders    Arthritis    Diverticulosis    Elevated bilirubin 08/09/2016   level 1.4   Essential hypertension, benign    Fibroid    posterior   Herpes genitalis in women    History of MRSA infection 07/2015   culture positive [facility]   Hyperlipidemia    Hypertension    Osteopenia 2018   hip and spine   Primary localized osteoarthritis of left knee    Rectocele    Sleep apnea    Urinary incontinence    Past Surgical History:  Procedure Laterality Date   arthroscopic knee Left 2001   BILATERAL OOPHORECTOMY  2010   Dr. Nikki ALES OSTEOTOMY Right 06/20/2022   Procedure: CALCANEAL OSTEOTOMY;  Surgeon: Kit Rush, MD;  Location: [facility];  Service: Orthopedics;  Laterality: Right;   CARPAL TUNNEL RELEASE Bilateral    COLONOSCOPY     DIAGNOSTIC LAPAROSCOPY     GASTROCNEMIUS RECESSION Right 06/20/2022   Procedure: GASTROCNEMIUS RECESSION;  Surgeon: Kit Rush, MD;  Location:  SUR[facility]ERY C[facility];  Service: Orthopedics;  Laterality: Right;   PELVIC LAPAROSCOPY     x2--ectopics   TOTAL KNEE ARTHROPLASTY Left 12/11/2015   Procedure: LEFT TOTAL  KNEE ARTHROPLASTY;  Surgeon: Lamar Millman, MD;  Location: [facility];  Service: Orthopedics;  Laterality: Left;   Patient Active Problem List   Diagnosis Date Noted   Posterior tibial tendon dysfunction (PTTD) of both lower extremities 12/31/2021   Snoring 08/15/2021   Primary localized osteoarthritis of left knee 11/27/2015   Gilbert's syndrome    Herpes genitalis in women    Hypertriglyceridemia 08/24/2015   History of MRSA infection 07/22/2015   Aortic valve disorder    Essential hypertension, benign    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 Sept. 2025 for most recent episode of BPPV:  Referral date   Rationale for Evaluation and Treatment: Rehabilitation  SUBJECTIVE:   SUBJECTIVE STATEMENT: Pt reports episode started about 5 days ago; had dental appt yesterday and requested that they recline the chair and then  let her gradually lie back to avoid provocation of dizziness Pt accompanied by: self  PERTINENT HISTORY: RAMIAH HELFRICH is a 73 y.o. female with a history of benign paroxysmal positional vertigo who presents for evaluation of recent episode of vertigo. History of benign paroxysmal positional vertigo for many years. Does home Epley maneuvers and typically resolves. During last episode performed Epley maneuver multiple times daily for five days, then took a break. Persistent vertigo for approximately two and a half weeks. Did not seem to resolve. She was finally able to get the episode to resolve. Seemed to be on the  left side and seems like prior episodes of BPPV. Episodes last seconds to under a minute.   PAIN:  Are you having pain? No  PRECAUTIONS: None  RED FLAGS: None   WEIGHT BEARING RESTRICTIONS: No  FALLS: Has patient fallen in last 6 months? No  PLOF: Independent  PATIENT GOALS: to see if I am the doing Epley correctly and to learn any other maneuvers for treatment if any more are available  OBJECTIVE:  Note: Objective measures were completed at Evaluation unless otherwise noted.  DIAGNOSTIC FINDINGS: N/A    GAIT: Gait pattern: WFL Level of assistance: Complete Independence    POSITIONAL TESTING: Right Sidelying: no nystagmus and no nystagmus and no c/o vertigo Left Sidelying: no nystagmus and no c/o vertigo  Dix-Hallpike tests not performed due to no c/o vertigo at this time  MOTION SENSITIVITY:  Motion Sensitivity Quotient Intensity: 0 = none, 1 = Lightheaded, 2 = Mild, 3 = Moderate, 4 = Severe, 5 = Vomiting  Intensity  1. Sitting to supine   2. Supine to L side   3. Supine to R side   4. Supine to sitting   5. L Hallpike-Dix   6. Up from L    7. R Hallpike-Dix   8. Up from R    9. Sitting, head tipped to L knee   10. Head up from L knee   11. Sitting, head tipped to R knee   12. Head up from R knee   13. Sitting head turns x5   14.Sitting head nods x5   15. In stance, 180 turn to L    16. In stance, 180 turn to R                                                                                                                               TREATMENT DATE: 10-12-24  TherAct:  Lt Dix-Hallpike test (+) with mild intensity Lt rotary upbeating nystagmus - low amplitude  Canalith Repositioning: Epley maneuver performed 4 reps for Lt BPPV; symptoms improved on each subsequent rep with very mild low intensity nystagmus (only approx. 3 beats) noted on 4th rep with very minimal c/o vertigo   Self Care; reviewed etiology of BPPV and Epley prn for self treatment;  instructed to stay well-hydrated for reabsorption process   PATIENT EDUCATION: Education details: reviewed  etiology of BPPV Person educated: Patient Education method: Explanation, Demonstration, and Handouts Education comprehension: verbalized understanding and returned demonstration  HOME EXERCISE PROGRAM:  no HEP needed - pt verbalizes understanding of canalith repositioning maneuvers - see above  GOALS: Goals reviewed with patient? N/A - eval only  SHORT TERM GOALS: N/A   LONG TERM GOALS: Target date: N/A  ASSESSMENT:  CLINICAL IMPRESSION: Pt had (+) Lt Dix-Hallpike test with rotary nystagmus and c/o vertigo, indicative of Lt BPPV posterior canalithiasis.  Pt was treated with 4 reps of Epley maneuver with symptoms almost fully resolved on 4th rep. Very minimal low amplitude nystagmus noted in 1st position of 4th rep but very minimal c/o vertigo.  Pt verbalized understanding of Epley for self treatment.  Will cont to asses and treat prn.    REHAB POTENTIAL: N/A - eval only  CLINICAL DECISION MAKING: Stable/uncomplicated  EVALUATION COMPLEXITY: Low   PLAN:  PT FREQUENCY: one time visit  PT DURATION: 1 week  PLANNED INTERVENTIONS: 97535- Self Care and initial evaluation  PLAN FOR NEXT SESSION: recheck Lt BPPV if not fully resolved - pt to call for follow up if needed   Rithwik Schmieg Suzanne, PT 10/12/2024, 8:25 PM  "

## 2024-10-19 ENCOUNTER — Other Ambulatory Visit (HOSPITAL_COMMUNITY): Payer: Self-pay

## 2024-10-26 ENCOUNTER — Ambulatory Visit: Admitting: Physical Therapy

## 2024-10-26 DIAGNOSIS — H8112 Benign paroxysmal vertigo, left ear: Secondary | ICD-10-CM | POA: Diagnosis present

## 2024-10-26 NOTE — Patient Instructions (Signed)
 Tip Card  1.The goal of habituation training is to assist in decreasing symptoms of vertigo, dizziness, or nausea provoked by specific head and body motions. 2.These exercises may initially increase symptoms; however, be persistent and work through symptoms. With repetition and time, the exercises will assist in reducing or eliminating symptoms. 3.Exercises should be stopped and discussed with the therapist if you experience any of the following: - Sudden change or fluctuation in hearing - New onset of ringing in the ears, or increase in current intensity - Any fluid discharge from the ear - Severe pain in neck or back - Extreme nausea  Copyright  VHI. All rights reserved.

## 2024-10-26 NOTE — Therapy (Signed)
 " OUTPATIENT PHYSICAL THERAPY VESTIBULAR TREATMENT NOTE     Patient Name: Rebecca Terry MRN: [id number] DOB:April 22, 1951, 74 y.o., female Today's Date: 10/28/2024  END OF SESSION:  PT End of Session - 10/28/24 0847     Visit Number 2    Number of Visits 4    Date for Recertification  11/19/24    Authorization Type HTA Medicare    Authorization Time Period 08-24-24 - 10-20-24;  10-26-24 - 2--28-26    PT Start Time 1620    PT Stop Time 1650    PT Time Calculation (min) 30 min    Activity Tolerance Patient tolerated treatment well    Behavior During Therapy [facility] for tasks assessed/performed            Past Medical History:  Diagnosis Date   Abnormal Pap smear of cervix    09/2013 Ascus:Pos HR HPV, 03/2014 Ascus:Pos HR HPV;colpo 05/2014 LGSIL of cx and VIN 1of perianal region, 08-09-16 neg HPV HR +   Aortic valve disorders    Arthritis    Diverticulosis    Elevated bilirubin 08/09/2016   level 1.4   Essential hypertension, benign    Fibroid    posterior   Herpes genitalis in women    History of MRSA infection 07/2015   culture positive [facility]   Hyperlipidemia    Hypertension    Osteopenia 2018   hip and spine   Primary localized osteoarthritis of left knee    Rectocele    Sleep apnea    Urinary incontinence    Past Surgical History:  Procedure Laterality Date   arthroscopic knee Left 2001   BILATERAL OOPHORECTOMY  2010   Dr. Nikki ALES OSTEOTOMY Right 06/20/2022   Procedure: CALCANEAL OSTEOTOMY;  Surgeon: Kit Rush, MD;  Location: [facility];  Service: Orthopedics;  Laterality: Right;   CARPAL TUNNEL RELEASE Bilateral    COLONOSCOPY     DIAGNOSTIC LAPAROSCOPY     GASTROCNEMIUS RECESSION Right 06/20/2022   Procedure: GASTROCNEMIUS RECESSION;  Surgeon: Kit Rush, MD;  Location: [facility];  Service: Orthopedics;  Laterality: Right;   PELVIC LAPAROSCOPY     x2--ectopics   TOTAL KNEE ARTHROPLASTY Left 12/11/2015    Procedure: LEFT TOTAL KNEE ARTHROPLASTY;  Surgeon: Lamar Millman, MD;  Location: [facility] OR;  Service: Orthopedics;  Laterality: Left;   Patient Active Problem List   Diagnosis Date Noted   Posterior tibial tendon dysfunction (PTTD) of both lower extremities 12/31/2021   Snoring 08/15/2021   Primary localized osteoarthritis of left knee 11/27/2015   Gilbert's syndrome    Herpes genitalis in women    Hypertriglyceridemia 08/24/2015   History of MRSA infection 07/22/2015   Aortic valve disorder    Essential hypertension, benign    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 Sept. 2025 for most recent episode of BPPV:  Referral date   Rationale for Evaluation and Treatment: Rehabilitation  SUBJECTIVE:   SUBJECTIVE STATEMENT: Pt reports she had no dizziness after previous treatment session on Dec. 23rd; reports dizziness reoccurred  this past weekend but not the bad spinning vertigo; did the Epley yesterday morning (Monday) and reports today (Tues.) is a better day; yesterday was not a good day - did not go anywhere because she didn't really need to and didn't feel that great Pt accompanied by: self  PERTINENT HISTORY: Rebecca Terry is a 74 y.o. female with a history of benign paroxysmal positional vertigo who presents for evaluation of recent episode of vertigo. History of benign paroxysmal positional vertigo for many years. Does home Epley maneuvers and typically resolves. During last episode performed Epley maneuver multiple times  daily for five days, then took a break. Persistent vertigo for approximately two and a half weeks. Did not seem to resolve. She was finally able to get the episode to resolve. Seemed to be on the left side and seems like prior episodes of BPPV. Episodes last seconds to under a minute.   PAIN:  Are you having pain? No  PRECAUTIONS: None  RED FLAGS: None   WEIGHT BEARING RESTRICTIONS: No  FALLS: Has patient fallen in last 6 months? No  PLOF: Independent  PATIENT GOALS: to see if I am the doing Epley correctly and to learn any other maneuvers for treatment if any more are available  OBJECTIVE:  Note: Objective measures were completed at Evaluation unless otherwise noted.  DIAGNOSTIC FINDINGS: N/A    GAIT: Gait pattern: WFL Level of assistance: Complete Independence    POSITIONAL TESTING: Right Sidelying: no nystagmus and no nystagmus and no c/o vertigo Left Sidelying: no nystagmus and no c/o vertigo  Dix-Hallpike tests not performed due to no c/o vertigo at this time  MOTION SENSITIVITY:  Motion Sensitivity Quotient Intensity: 0 = none, 1 = Lightheaded, 2 = Mild, 3 = Moderate, 4 = Severe, 5 = Vomiting  Intensity  1. Sitting to supine   2. Supine to L side   3. Supine to R side   4. Supine to sitting   5. L Hallpike-Dix   6. Up from L    7. R Hallpike-Dix   8. Up from R    9. Sitting, head tipped to L knee   10. Head up from L knee   11. Sitting, head tipped to R knee   12. Head up from R knee   13. Sitting head turns x5   14.Sitting head nods x5   15. In stance, 180 turn to L    16. In stance, 180 turn to R                                                                                                                               TREATMENT DATE: 10-27-23  NeuroRe-ed: Lt Dix-Hallpike test (-) with no nystagmus and no c/o vertigo in test position Rt Dix-Hallpike test (-) with no nystagmus and no c/o vertigo  Performed positional testing - attempted to  provoke vertigo - sit to Rt and Lt sidelying - no nystagmus  and no c/o vertigo; no c/o vertigo with return to upright seated position Rolling supine to Rt side (no vertigo) and then from Rt sidelying to Lt sidelying - no c/o vertigo  SVA - line 9:  DVA line 8 (WNL's  Pt stood on [employer]ire[employer] - 30 secs with EO and 30 secs with EC - min. Postural sway with standing with EC but no LOB  Self Care; discussed symptoms as inability to provoke vertigo in today's session; no signs of BPPV noted in today's session Educated pt in [place] test and balance on foam testing - no indications of decreased vestibular input in maintaining balance  PATIENT EDUCATION: Education details: reviewed etiology of BPPV Person educated: Patient Education method: Programmer, Multimedia, Demonstration, and Handouts Education comprehension: verbalized understanding and returned demonstration  HOME EXERCISE PROGRAM:  no HEP needed - pt verbalizes understanding of canalith repositioning maneuvers - see above  GOALS: Goals reviewed with patient? N/A - eval only  SHORT TERM GOALS: N/A   LONG TERM GOALS: Target date: N/A  ASSESSMENT:  CLINICAL IMPRESSION:  All positional testing negative at today's visit, despite pt reporting feeling possible re-occurrence of vertigo.  Unable to provoke any dizziness with any movement in today's session.  Pt verbalizes understanding of Epley maneuver for self treatment prn.  No follow up scheduled at this time - pt requests to call for another appt if needed.  Will D/C in 30 days if no follow up needed.   REHAB POTENTIAL: N/A - eval only  CLINICAL DECISION MAKING: Stable/uncomplicated  EVALUATION COMPLEXITY: Low   PLAN:  PT FREQUENCY: 1x/week  PT DURATION: 2 weeks  PLANNED INTERVENTIONS: 97535- Self Care and initial evaluation  PLAN FOR NEXT SESSION: recheck Lt BPPV if not fully resolved - pt to call for follow up if needed   Louis Gaw Suzanne, PT 10/28/2024, 9:00 AM  "

## 2024-10-28 ENCOUNTER — Encounter: Payer: Self-pay | Admitting: Physical Therapy

## 2024-11-01 ENCOUNTER — Ambulatory Visit: Admitting: Physical Therapy

## 2024-11-04 ENCOUNTER — Ambulatory Visit (INDEPENDENT_AMBULATORY_CARE_PROVIDER_SITE_OTHER)

## 2024-11-04 ENCOUNTER — Encounter (INDEPENDENT_AMBULATORY_CARE_PROVIDER_SITE_OTHER): Payer: Self-pay

## 2024-11-04 ENCOUNTER — Other Ambulatory Visit (HOSPITAL_COMMUNITY): Payer: Self-pay

## 2024-11-04 VITALS — BP 116/76 | HR 67

## 2024-11-04 DIAGNOSIS — H8112 Benign paroxysmal vertigo, left ear: Secondary | ICD-10-CM | POA: Diagnosis not present

## 2024-11-04 DIAGNOSIS — R42 Dizziness and giddiness: Secondary | ICD-10-CM

## 2024-11-04 MED ORDER — METHYLPREDNISOLONE 4 MG PO TBPK
ORAL_TABLET | ORAL | 0 refills | Status: AC
  Filled 2024-11-04: qty 21, 6d supply, fill #0

## 2024-11-04 NOTE — Progress Notes (Signed)
 Dear Dr. Austin, Here is my assessment for our mutual patient, Rebecca Terry. Thank you for allowing me the opportunity to care for your patient. Please do not hesitate to contact me should you have any other questions. Sincerely, Dr. Hadassah Parody  Otolaryngology Clinic Note Referring provider: Dr. Austin HPI:   Initial HPI (08/19/24) Rebecca Terry is a 74 y.o. female with a history of benign paroxysmal positional vertigo who presents for evaluation of recent episode of vertigo. History of benign paroxysmal positional vertigo for many years. Does home Epley maneuvers and typically resolves. During last episode performed Epley maneuver multiple times daily for five days, then took a break. Persistent vertigo for approximately two and a half weeks. Did not seem to resolve. She was finally able to get the episode to resolve. Seemed to be on the left side and seems like prior episodes of BPPV. Episodes last seconds to under a minute.   No associated hearing loss, tinnitus, or aural fullness.   --------------------------------------------------------- 11/04/2024  Presents for follow-up today.  She went to vestibular rehab.  They performed Epley maneuvers and she was finally able to resolve the BPPV.  She usually has trouble sleeping on her left side and was told she cannot sleep on her left side.  She did this and the vertigo then returned.  She now has persistent dizziness and unsteadiness.  She went again to vestibular rehab and they perform or Epley maneuvers but no longer seems to be working.  The vertigo does come and go but is consistent that it worsens when she turns left on her bed. She wonders if this could be a vestibular neuritis.  She has not had any recent illness.  Truly seems like it is set off by laying on her left side.  She agrees this sounds most consistent with BPPV but is frustrated that the Epley maneuvers do not seem to be working.  This has been significantly impacting  her daily functioning.  She wonders if there is anything else we can do.   No history of migraine though she does get headaches at time that resolves with ibuprofen.     Independent Review of Additional Tests or Records:  ENT referral 08/02/24 (Vyvyan Sun, MD): persistent vertigo despite Epley maneuvers   Vestibular rehab note 10/26/2024 Rock Kussmaul, PT: No vertigo despite Epley maneuvers.  BPPV not present at time of evaluation.   PMH/Meds/All/SocHx/FamHx/ROS:   Past Medical History:  Diagnosis Date   Abnormal Pap smear of cervix    09/2013 Ascus:Pos HR HPV, 03/2014 Ascus:Pos HR HPV;colpo 05/2014 LGSIL of cx and VIN 1of perianal region, 08-09-16 neg HPV HR +   Aortic valve disorders    Arthritis    Diverticulosis    Elevated bilirubin 08/09/2016   level 1.4   Essential hypertension, benign    Fibroid    posterior   Herpes genitalis in women    History of MRSA infection 07/2015   culture positive [facility]   Hyperlipidemia    Hypertension    Osteopenia 2018   hip and spine   Primary localized osteoarthritis of left knee    Rectocele    Sleep apnea    Urinary incontinence      Past Surgical History:  Procedure Laterality Date   arthroscopic knee Left 2001   BILATERAL OOPHORECTOMY  2010   Dr. Nikki ALES OSTEOTOMY Right 06/20/2022   Procedure: CALCANEAL OSTEOTOMY;  Surgeon: Kit Rush, MD;  Location: [facility];  Service: Orthopedics;  Laterality: Right;   CARPAL TUNNEL RELEASE Bilateral    COLONOSCOPY     DIAGNOSTIC LAPAROSCOPY     GASTROCNEMIUS RECESSION Right 06/20/2022   Procedure: GASTROCNEMIUS RECESSION;  Surgeon: Kit Rush, MD;  Location: [facility];  Service: Orthopedics;  Laterality: Right;   PELVIC LAPAROSCOPY     x2--ectopics   TOTAL KNEE ARTHROPLASTY Left 12/11/2015   Procedure: LEFT TOTAL KNEE ARTHROPLASTY;  Surgeon: Lamar Millman, MD;  Location: [facility] OR;  Service: Orthopedics;  Laterality: Left;    Family  History  Problem Relation Age of Onset   Asthma Mother    Congestive Heart Failure Father    Multiple sclerosis Father    Heart attack Father    BRCA 1/2 Neg Hx    Breast cancer Neg Hx      Social Connections: Not on file     Current Outpatient Medications  Medication Instructions   clobetasol  ointment (TEMOVATE ) 0.05 % Apply externally Twice a day for 14 days   COVID-19 mRNA vaccine, Pfizer, (COMIRNATY ) syringe 0.3 mLs, Intramuscular   doxycycline  (VIBRA -TABS) 100 mg, Oral, Daily   estradiol  (ESTRACE ) 0.5 g, Vaginal, 3 times weekly, Patient will call if she needs a refill.   estradiol  (ESTRACE ) 0.5 g, Vaginal, 3 times weekly   losartan -hydrochlorothiazide  (HYZAAR ) 100-25 MG tablet 1 tablet, Oral, Daily   Magnesium 400 MG TABS 1 tablet, Daily   meloxicam  (MOBIC ) 7.5 MG tablet Take 1 tablet (7.5 mg total) by mouth 2 (two) times daily with meals for pain and inflammation.   methylPREDNISolone  (MEDROL  DOSEPAK) 4 MG TBPK tablet Take as directed on the instructions   metoprolol  succinate (TOPROL -XL) 25 mg, Oral, Daily   Multiple Vitamin (MULTIVITAMIN) tablet 1 tablet, Daily   rosuvastatin  (CRESTOR ) 10 mg, Oral, Daily   TURMERIC PO 1 tablet, Daily   zolpidem  (AMBIEN ) 5 MG tablet Take 1-2 tablets at bedtime as needed for insomnia   zolpidem  (AMBIEN ) 5-10 mg, Oral, Daily at bedtime     Physical Exam:   BP 116/76 (BP Location: Right Arm, Patient Position: Sitting)   Pulse 67   LMP 10/21/1992   SpO2 93%   Salient findings:  CN II-XII intact Bilateral EAC clear and TM intact with well pneumatized middle ear spaces No lesions of oral cavity/oropharynx No obviously palpable neck masses/lymphadenopathy/thyromegaly No respiratory distress or stridor  Seprately Identifiable Procedures:  none    Impression & Plans:  Rebecca Terry is a 74 y.o. female with   1. Vertigo   2. Benign paroxysmal positional vertigo of left ear     Vertigo Benign paroxysmal positional vertigo,  left  Symptom pattern remains most consistent with BPPV, with persistent post-maneuver dizziness and unsteadiness, especially when lying on her left side. Persistence after multiple canalith repositioning maneuvers suggests possible canalith migration to a different canal or residual vestibular dysfunction. No associated hearing loss, tinnitus, or aural fullness.  She is becoming anxious about this and would be interested in additional testing if this could help provide some answers for her.   -Discussed continued observation to see if she improves versus trialing a steroid pack in the case that this could be inflammation although that I think this is less likely, versus referral for vestibular testing incase other etiology of vertigo or if involving a different canal.  She would like both the referral and to trial the steroids.   - Advised her to continue sleeping on her right side until completion of steroid course and reassessment of symptoms. -  Discussed that she may cancel the specialist appointment if symptoms resolve.    See below regarding exact medications prescribed this encounter including dosages and route: Meds ordered this encounter  Medications   methylPREDNISolone  (MEDROL  DOSEPAK) 4 MG TBPK tablet    Sig: Take as directed on the instructions    Dispense:  21 tablet    Refill:  0    Thank you for allowing me the opportunity to care for your patient. Please do not hesitate to contact me should you have any other questions.  Sincerely, Hadassah Parody, MD Otolaryngologist (ENT), [facility] Phone: [phone] Fax: [phone]

## 2024-11-05 ENCOUNTER — Encounter (INDEPENDENT_AMBULATORY_CARE_PROVIDER_SITE_OTHER): Payer: Self-pay

## 2024-11-05 ENCOUNTER — Other Ambulatory Visit (HOSPITAL_COMMUNITY): Payer: Self-pay

## 2024-11-17 ENCOUNTER — Other Ambulatory Visit (HOSPITAL_COMMUNITY): Payer: Self-pay

## 2024-11-18 ENCOUNTER — Other Ambulatory Visit: Payer: Self-pay

## 2024-11-18 ENCOUNTER — Other Ambulatory Visit (HOSPITAL_COMMUNITY): Payer: Self-pay

## 2024-11-18 MED ORDER — ZOLPIDEM TARTRATE 5 MG PO TABS
5.0000 mg | ORAL_TABLET | Freq: Every evening | ORAL | 0 refills | Status: AC | PRN
  Filled 2024-11-18 (×3): qty 15, 15d supply, fill #0

## 2024-11-19 ENCOUNTER — Other Ambulatory Visit: Payer: Self-pay

## 2025-03-29 ENCOUNTER — Ambulatory Visit: Admitting: Obstetrics and Gynecology
